# Patient Record
Sex: Male | Born: 1960 | Race: White | Hispanic: No | Marital: Married | Smoking: Never smoker
Health system: Southern US, Community
[De-identification: ages and names within clinical notes are randomized; demographics above are authoritative.]

## PROBLEM LIST (undated history)

## (undated) DIAGNOSIS — I509 Heart failure, unspecified: Secondary | ICD-10-CM

## (undated) DIAGNOSIS — K7460 Unspecified cirrhosis of liver: Secondary | ICD-10-CM

## (undated) DIAGNOSIS — J45909 Unspecified asthma, uncomplicated: Secondary | ICD-10-CM

## (undated) DIAGNOSIS — J449 Chronic obstructive pulmonary disease, unspecified: Secondary | ICD-10-CM

## (undated) DIAGNOSIS — G809 Cerebral palsy, unspecified: Secondary | ICD-10-CM

## (undated) HISTORY — PX: CHOLECYSTECTOMY: SHX55

## (undated) HISTORY — PX: LEG SURGERY: SHX1003

---

## 2017-12-14 ENCOUNTER — Emergency Department: Payer: Medicare Other

## 2017-12-14 ENCOUNTER — Other Ambulatory Visit: Payer: Self-pay

## 2017-12-14 ENCOUNTER — Inpatient Hospital Stay
Admission: EM | Admit: 2017-12-14 | Discharge: 2017-12-21 | DRG: 640 | Disposition: A | Discharge status: Skilled Nursing Facility | Payer: Medicare Other | Attending: Specialist | Admitting: Specialist

## 2017-12-14 DIAGNOSIS — G809 Cerebral palsy, unspecified: Secondary | ICD-10-CM | POA: Diagnosis present

## 2017-12-14 DIAGNOSIS — E871 Hypo-osmolality and hyponatremia: Secondary | ICD-10-CM | POA: Diagnosis present

## 2017-12-14 DIAGNOSIS — Z7189 Other specified counseling: Secondary | ICD-10-CM | POA: Diagnosis not present

## 2017-12-14 DIAGNOSIS — K269 Duodenal ulcer, unspecified as acute or chronic, without hemorrhage or perforation: Secondary | ICD-10-CM | POA: Diagnosis present

## 2017-12-14 DIAGNOSIS — K317 Polyp of stomach and duodenum: Secondary | ICD-10-CM | POA: Diagnosis present

## 2017-12-14 DIAGNOSIS — D62 Acute posthemorrhagic anemia: Secondary | ICD-10-CM | POA: Diagnosis not present

## 2017-12-14 DIAGNOSIS — K7581 Nonalcoholic steatohepatitis (NASH): Secondary | ICD-10-CM | POA: Diagnosis present

## 2017-12-14 DIAGNOSIS — Z6841 Body Mass Index (BMI) 40.0 and over, adult: Secondary | ICD-10-CM

## 2017-12-14 DIAGNOSIS — G4710 Hypersomnia, unspecified: Secondary | ICD-10-CM | POA: Diagnosis present

## 2017-12-14 DIAGNOSIS — R4182 Altered mental status, unspecified: Secondary | ICD-10-CM

## 2017-12-14 DIAGNOSIS — I959 Hypotension, unspecified: Secondary | ICD-10-CM | POA: Diagnosis present

## 2017-12-14 DIAGNOSIS — K7460 Unspecified cirrhosis of liver: Secondary | ICD-10-CM | POA: Diagnosis present

## 2017-12-14 DIAGNOSIS — Z66 Do not resuscitate: Secondary | ICD-10-CM | POA: Diagnosis present

## 2017-12-14 DIAGNOSIS — J449 Chronic obstructive pulmonary disease, unspecified: Secondary | ICD-10-CM | POA: Diagnosis present

## 2017-12-14 DIAGNOSIS — T502X5A Adverse effect of carbonic-anhydrase inhibitors, benzothiadiazides and other diuretics, initial encounter: Secondary | ICD-10-CM | POA: Diagnosis present

## 2017-12-14 DIAGNOSIS — D7589 Other specified diseases of blood and blood-forming organs: Secondary | ICD-10-CM | POA: Diagnosis present

## 2017-12-14 DIAGNOSIS — K766 Portal hypertension: Secondary | ICD-10-CM | POA: Diagnosis present

## 2017-12-14 DIAGNOSIS — E8881 Metabolic syndrome: Secondary | ICD-10-CM | POA: Diagnosis present

## 2017-12-14 DIAGNOSIS — S81812A Laceration without foreign body, left lower leg, initial encounter: Secondary | ICD-10-CM | POA: Diagnosis present

## 2017-12-14 DIAGNOSIS — W19XXXA Unspecified fall, initial encounter: Secondary | ICD-10-CM | POA: Diagnosis present

## 2017-12-14 DIAGNOSIS — K7290 Hepatic failure, unspecified without coma: Secondary | ICD-10-CM | POA: Diagnosis present

## 2017-12-14 DIAGNOSIS — K5900 Constipation, unspecified: Secondary | ICD-10-CM | POA: Diagnosis present

## 2017-12-14 DIAGNOSIS — Z79899 Other long term (current) drug therapy: Secondary | ICD-10-CM | POA: Diagnosis not present

## 2017-12-14 DIAGNOSIS — G9341 Metabolic encephalopathy: Secondary | ICD-10-CM | POA: Diagnosis present

## 2017-12-14 DIAGNOSIS — I5033 Acute on chronic diastolic (congestive) heart failure: Secondary | ICD-10-CM | POA: Diagnosis present

## 2017-12-14 DIAGNOSIS — Z515 Encounter for palliative care: Secondary | ICD-10-CM | POA: Diagnosis not present

## 2017-12-14 DIAGNOSIS — D649 Anemia, unspecified: Secondary | ICD-10-CM | POA: Diagnosis not present

## 2017-12-14 HISTORY — DX: Heart failure, unspecified: I50.9

## 2017-12-14 HISTORY — DX: Cerebral palsy, unspecified: G80.9

## 2017-12-14 HISTORY — DX: Unspecified asthma, uncomplicated: J45.909

## 2017-12-14 HISTORY — DX: Unspecified cirrhosis of liver: K74.60

## 2017-12-14 HISTORY — DX: Chronic obstructive pulmonary disease, unspecified: J44.9

## 2017-12-14 LAB — APTT: APTT: 45 s — AB (ref 24–36)

## 2017-12-14 LAB — COMPREHENSIVE METABOLIC PANEL
ALK PHOS: 130 U/L — AB (ref 38–126)
ALT: 47 U/L (ref 17–63)
AST: 84 U/L — ABNORMAL HIGH (ref 15–41)
Albumin: 2.2 g/dL — ABNORMAL LOW (ref 3.5–5.0)
Anion gap: 8 (ref 5–15)
BILIRUBIN TOTAL: 5.8 mg/dL — AB (ref 0.3–1.2)
BUN: 20 mg/dL (ref 6–20)
CALCIUM: 8 mg/dL — AB (ref 8.9–10.3)
CO2: 23 mmol/L (ref 22–32)
CREATININE: 1.11 mg/dL (ref 0.61–1.24)
Chloride: 92 mmol/L — ABNORMAL LOW (ref 101–111)
GFR calc non Af Amer: >60 mL/min (ref >60)
Glucose, Bld: 117 mg/dL — ABNORMAL HIGH (ref 65–99)
Potassium: 3.5 mmol/L (ref 3.5–5.1)
SODIUM: 123 mmol/L — AB (ref 135–145)
Total Protein: 5.6 g/dL — ABNORMAL LOW (ref 6.5–8.1)

## 2017-12-14 LAB — URIC ACID: Uric Acid, Serum: 8.7 mg/dL — ABNORMAL HIGH (ref 4.4–7.6)

## 2017-12-14 LAB — CBC
HEMATOCRIT: 23.1 % — AB (ref 40.0–52.0)
HEMOGLOBIN: 8.1 g/dL — AB (ref 13.0–18.0)
MCH: 36.1 pg — AB (ref 26.0–34.0)
MCHC: 35.1 g/dL (ref 32.0–36.0)
MCV: 102.9 fL — ABNORMAL HIGH (ref 80.0–100.0)
Platelets: 103 10*3/uL — ABNORMAL LOW (ref 150–440)
RBC: 2.25 MIL/uL — AB (ref 4.40–5.90)
RDW: 14.7 % — ABNORMAL HIGH (ref 11.5–14.5)
WBC: 5.8 10*3/uL (ref 3.8–10.6)

## 2017-12-14 LAB — PROTIME-INR
INR: 2.3
Prothrombin Time: 25.1 seconds — ABNORMAL HIGH (ref 11.4–15.2)

## 2017-12-14 LAB — AMMONIA: Ammonia: 35 umol/L (ref 9–35)

## 2017-12-14 LAB — LACTIC ACID, PLASMA
LACTIC ACID, VENOUS: 2.4 mmol/L — AB (ref 0.5–1.9)
Lactic Acid, Venous: 2.4 mmol/L (ref 0.5–1.9)

## 2017-12-14 LAB — TROPONIN I: Troponin I: <0.03 ng/mL (ref <0.03)

## 2017-12-14 LAB — MRSA PCR SCREENING: MRSA by PCR: NEGATIVE

## 2017-12-14 MED ORDER — LIDOCAINE-EPINEPHRINE (PF) 1 %-1:200000 IJ SOLN
30.0000 mL | Freq: Once | INTRAMUSCULAR | Status: AC
  Administered 2017-12-14: 30 mL via INTRADERMAL

## 2017-12-14 MED ORDER — SODIUM CHLORIDE 0.9 % IV SOLN
Freq: Once | INTRAVENOUS | Status: AC
  Administered 2017-12-14: 18:00:00 via INTRAVENOUS

## 2017-12-14 MED ORDER — ACETAMINOPHEN 650 MG RE SUPP: 650.0000 mg | Freq: Four times a day (QID) | RECTAL | Status: DC | PRN

## 2017-12-14 MED ORDER — MIDODRINE HCL 5 MG PO TABS
5.0000 mg | ORAL_TABLET | Freq: Once | ORAL | Status: AC
  Administered 2017-12-14: 5 mg via ORAL
  Filled 2017-12-14: qty 1

## 2017-12-14 MED ORDER — POLYETHYLENE GLYCOL 3350 17 G PO PACK
17.0000 g | PACK | Freq: Every day | ORAL | Status: DC
  Administered 2017-12-14 – 2017-12-20 (×7): 17 g via ORAL
  Filled 2017-12-14 (×7): qty 1

## 2017-12-14 MED ORDER — RIFAXIMIN 550 MG PO TABS
550.0000 mg | ORAL_TABLET | Freq: Two times a day (BID) | ORAL | Status: DC
  Administered 2017-12-14 – 2017-12-21 (×15): 550 mg via ORAL
  Filled 2017-12-14 (×15): qty 1

## 2017-12-14 MED ORDER — BISACODYL 5 MG PO TBEC
5.0000 mg | DELAYED_RELEASE_TABLET | Freq: Every day | ORAL | Status: DC | PRN
  Administered 2017-12-17: 5 mg via ORAL
  Filled 2017-12-14: qty 1

## 2017-12-14 MED ORDER — SODIUM CHLORIDE 0.9 % IV SOLN
1000.0000 mL | Freq: Once | INTRAVENOUS | Status: AC
  Administered 2017-12-14: 1000 mL via INTRAVENOUS

## 2017-12-14 MED ORDER — OXYCODONE HCL 5 MG PO TABS
5.0000 mg | ORAL_TABLET | Freq: Once | ORAL | Status: AC
  Administered 2017-12-14: 5 mg via ORAL
  Filled 2017-12-14: qty 1

## 2017-12-14 MED ORDER — LIDOCAINE-EPINEPHRINE (PF) 1 %-1:200000 IJ SOLN
INTRAMUSCULAR | Status: AC
  Administered 2017-12-14: 30 mL via INTRADERMAL
  Filled 2017-12-14: qty 30

## 2017-12-14 MED ORDER — ENOXAPARIN SODIUM 40 MG/0.4ML ~~LOC~~ SOLN
40.0000 mg | Freq: Two times a day (BID) | SUBCUTANEOUS | Status: DC
  Administered 2017-12-14 – 2017-12-21 (×14): 40 mg via SUBCUTANEOUS
  Filled 2017-12-14 (×14): qty 0.4

## 2017-12-14 MED ORDER — LACTULOSE 10 GM/15ML PO SOLN
30.0000 g | Freq: Three times a day (TID) | ORAL | Status: DC
  Administered 2017-12-14 – 2017-12-18 (×11): 30 g via ORAL
  Filled 2017-12-14 (×11): qty 60

## 2017-12-14 MED ORDER — PROMETHAZINE HCL 25 MG PO TABS
12.5000 mg | ORAL_TABLET | Freq: Four times a day (QID) | ORAL | Status: DC | PRN
  Filled 2017-12-14: qty 1

## 2017-12-14 MED ORDER — ACETAMINOPHEN 325 MG PO TABS: 650.0000 mg | ORAL_TABLET | Freq: Four times a day (QID) | ORAL | Status: DC | PRN

## 2017-12-14 MED ORDER — POLYETHYLENE GLYCOL 3350 17 G PO PACK
17.0000 g | PACK | Freq: Every day | ORAL | Status: DC | PRN
  Administered 2017-12-17: 11:00:00 17 g via ORAL
  Filled 2017-12-14 (×2): qty 1

## 2017-12-14 MED ORDER — POLYETHYLENE GLYCOL 3350 17 G PO PACK: 17.0000 g | PACK | Freq: Every day | ORAL | Status: DC

## 2017-12-14 MED ORDER — OXYCODONE HCL 5 MG PO TABS
5.0000 mg | ORAL_TABLET | ORAL | Status: DC | PRN
  Administered 2017-12-14 – 2017-12-21 (×7): 5 mg via ORAL
  Filled 2017-12-14 (×7): qty 1

## 2017-12-14 NOTE — Progress Notes (Signed)
Anticoagulation monitoring(Lovenox):  57 yo  male ordered Lovenox 40 mg Q24h  Filed Weights   12/14/17 0857 12/14/17 1507  Weight: (!) 347 lb (157.4 kg) (!) 338 lb 6.4 oz (153.5 kg)   BMI 54.65    Lab Results  Component Value Date   CREATININE 1.11 12/14/2017   Estimated Creatinine Clearance: 106 mL/min (by C-G formula based on SCr of 1.11 mg/dL). Hemoglobin & Hematocrit     Component Value Date/Time   HGB 8.1 (L) 12/14/2017 0922   HCT 23.1 (L) 12/14/2017 1610     Per Protocol for Patient with estCrcl > 30 ml/min and BMI >40, will transition to Lovenox 40 mg Q12h.

## 2017-12-14 NOTE — H&P (Signed)
[facility] [place] [facility]   PATIENT NAME: Antonio York    MR#:  [id number]  DATE OF BIRTH:  11/03/1960  DATE OF ADMISSION:  12/14/2017  PRIMARY CARE PHYSICIAN: Dimple Casey, MD   REQUESTING/REFERRING PHYSICIAN: dr Cyril Loosen  CHIEF COMPLAINT:  Confusion from [facility]  HISTORY OF PRESENT ILLNESS:  Antonio York  is a 57 y.o. 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  Patient reports that yesterday the MD at the facility told him the sodium level was low.  He was on fluid restriction and diuretics were discontinued.  This morning he reports that he fell.  He uses a Nurse, adult and has been ambulating with a walker.  Patient also noted to have a laceration on the left lower extremity which was sutured by ED physician.  Patient denies loss of consciousness, chest pain, nausea, vomiting, hematemesis, hematochezia or lightheadedness.  Sodium level is 123.  PAST MEDICAL HISTORY:   Past Medical History:  Diagnosis Date  . Asthma   . Cerebral palsy (HCC)   . CHF (congestive heart failure) (HCC)   . COPD (chronic obstructive pulmonary disease) (HCC)   . Liver cirrhosis (HCC)     PAST SURGICAL HISTORY:  None  SOCIAL HISTORY:  No tobacco, EtOH or IV drug use  FAMILY HISTORY:  No history of hemochromatosis or liver disease  DRUG ALLERGIES:  No Known Allergies  REVIEW OF SYSTEMS:   Review of Systems  Constitutional: Negative.  Negative for chills, fever and malaise/fatigue.  HENT: Negative.  Negative for ear discharge, ear pain, hearing loss, nosebleeds and sore throat.   Eyes: Negative.  Negative for blurred vision and pain.  Respiratory: Negative.  Negative for cough, hemoptysis, shortness of breath and wheezing.   Cardiovascular: Positive for leg swelling. Negative for chest pain and palpitations.   Gastrointestinal: Negative.  Negative for abdominal pain, blood in stool, diarrhea, nausea and vomiting.  Genitourinary: Negative.  Negative for dysuria.  Musculoskeletal: Negative.  Negative for back pain.  Skin: Negative.        Laceration noted on left shin  Neurological: Negative for dizziness, tremors, speech change, focal weakness, seizures and headaches.  Endo/Heme/Allergies: Negative.  Does not bruise/bleed easily.  Psychiatric/Behavioral: Negative.  Negative for depression, hallucinations and suicidal ideas.    MEDICATIONS AT HOME:   Prior to Admission medications   Medication Sig Start Date End Date Taking? Authorizing Provider  Diphenhyd-Hydrocort-Nystatin (FIRST-DUKES MOUTHWASH) SUSP Swish and swallow 5 mLs 3 (three) times daily before meals.   Yes [provider]  hydrocortisone cream 1 % Apply 1 application topically every 6 (six) hours as needed for itching. Apply to abdomen.   Yes [provider]  lactulose (CHRONULAC) 10 GM/15ML solution Take 45 mLs by mouth 3 (three) times daily. 11/17/17 12/17/17 Yes [provider]  polyethylene glycol (MIRALAX / GLYCOLAX) packet Take 17 g by mouth daily.   Yes [provider]  promethazine (PHENERGAN) 12.5 MG tablet Take 12.5 mg by mouth every 6 (six) hours as needed for nausea/vomiting.   Yes [provider]  spironolactone (ALDACTONE) 100 MG tablet Take 300 mg by mouth daily. 11/08/17  Yes [provider]  torsemide (DEMADEX) 20 MG tablet Take 100 mg by mouth daily. 11/08/17  Yes [provider]  XIFAXAN 550 MG TABS tablet Take 550 mg by mouth 2 (two) times daily.  10/16/17  Yes [provider]      VITAL SIGNS:  Blood pressure (!) 107/59, pulse 98, resp. rate (!) 21, weight (!) 157.4 kg (347 lb), SpO2 100 %.  PHYSICAL EXAMINATION:   Physical Exam  Constitutional: He is oriented to person, place, and time. No distress.  Morbidly obese  HENT:  Head:  Normocephalic.  Eyes: Scleral icterus is present.  Neck: Normal range of motion. Neck supple. No JVD present. No tracheal deviation present.  Cardiovascular: Normal rate, regular rhythm and normal heart sounds. Exam reveals no gallop and no friction rub.  No murmur heard. Pulmonary/Chest: Effort normal and breath sounds normal. No respiratory distress. He has no wheezes. He has no rales. He exhibits no tenderness.  Right IJ port  Abdominal: Soft. Bowel sounds are normal. He exhibits distension. He exhibits no mass. There is no tenderness. There is no rebound and no guarding.  Musculoskeletal: Normal range of motion. He exhibits edema.  Neurological: He is alert and oriented to person, place, and time.  Skin: Skin is warm. No rash noted. No erythema.  Laceration noted to left shin which was sutured Jaundice  Psychiatric: Judgment normal.      LABORATORY PANEL:   CBC Recent Labs  Lab 12/14/17 0922  WBC 5.8  HGB 8.1*  HCT 23.1*  PLT 103*   ------------------------------------------------------------------------------------------------------------------  Chemistries  Recent Labs  Lab 12/14/17 0922  NA 123*  K 3.5  CL 92*  CO2 23  GLUCOSE 117*  BUN 20  CREATININE 1.11  CALCIUM 8.0*  AST 84*  ALT 47  ALKPHOS 130*  BILITOT 5.8*   ------------------------------------------------------------------------------------------------------------------  Cardiac Enzymes Recent Labs  Lab 12/14/17 0922  TROPONINI <0.03   ------------------------------------------------------------------------------------------------------------------  RADIOLOGY:  Dg Tibia/fibula Left  Result Date: 12/14/2017 CLINICAL DATA:  Un witnessed fall. EXAM: LEFT TIBIA AND FIBULA - 2 VIEW COMPARISON:  None. FINDINGS: There is no evidence of fracture or dislocation. Soft tissues are unremarkable. IMPRESSION: No acute abnormality. Electronically Signed   By: Sherian Rein M.D.   On: 12/14/2017 10:03    Ct Head Wo Contrast  Result Date: 12/14/2017 CLINICAL DATA:  Post fall. EXAM: CT HEAD WITHOUT CONTRAST TECHNIQUE: Contiguous axial images were obtained from the base of the skull through the vertex without intravenous contrast. COMPARISON:  None. FINDINGS: Brain: Mild atrophy with centralized volume loss and commensurate ex vacuo dilatation of the ventricular system. Gray-white differentiation is maintained. No CT evidence of acute large territory infarct. No intraparenchymal or extra-axial mass or hemorrhage. Normal configuration of the ventricles and the basilar cisterns. No midline shift. Vascular: Minimal intracranial atherosclerosis. Skull: No definite displaced calvarial fracture. Sinuses/Orbits: Limited visualization of the paranasal sinuses and mastoid air cells is normal. No air-fluid levels. A small amount of debris is seen within bilateral external auditory canals. Other: Regional soft tissues appear normal. No radiopaque foreign body. IMPRESSION: Atrophy without superimposed acute intracranial process. Electronically Signed   By: Simonne Come M.D.   On: 12/14/2017 11:19   Dg Chest Portable 1 View  Result Date: 12/14/2017 CLINICAL DATA:  Un witnessed fall. EXAM: PORTABLE CHEST 1 VIEW COMPARISON:  None. FINDINGS: The heart size is mildly enlarged. Mediastinal contour is normal. Right central venous line is identified distal tip in superior vena cava. Both lungs are clear. The visualized skeletal structures are unremarkable. IMPRESSION: No active cardiopulmonary disease. Electronically Signed   By: Sherian Rein M.D.   On: 12/14/2017 10:02    EKG:  No ST elevation or depression  IMPRESSION  AND PLAN:   57 year old male with liver cirrhosis who presents to the ER after unwitnessed fall.  1.  Acute encephalopathy: Patient's mental status is at baseline  2.  Hyponatremia: This is likely due to diuretics possibly due to underlying liver cirrhosis Patient has received IV fluids in the  ER Sodium level every 6 hours Nephrology consultation for further evaluation and management Check sodium urine and uric acid  3.  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  Left shin laceration after fall: This was sutured by ER physician  5.  History of hemochromatosis: Patient has not required phlebotomy recently    All the records are reviewed and case discussed with ED provider. Management plans discussed with the patient and he is in agreement  CODE STATUS: FULL  TOTAL TIME TAKING CARE OF THIS PATIENT: 42 minutes.    Tekela Garguilo M.D on 12/14/2017 at 12:08 PM  Between 7am to 6pm - Pager - [phone]  After 6pm go to [email or web address] - password [email or web address]  [facility]  Office  [phone]  CC: Primary care physician; Dimple Casey, MD

## 2017-12-14 NOTE — ED Notes (Signed)
Date and time results received: 12/14/17 10:20 AM   Test: lactic acid Critical Value: 2.4  Name of Provider Notified: dr. Cyril Loosen  Orders Received? Or Actions Taken?: no new orders at this time

## 2017-12-14 NOTE — ED Provider Notes (Addendum)
[facility] Emergency Department Provider Note   ____________________________________________    I have reviewed the triage vital signs and the nursing notes.   HISTORY  Chief Complaint Fall   Patient unable to provide any history, unclear if this is his baseline or not  HPI Antonio York is a 57 y.o. male with a history of cerebral palsy, CHF, COPD as well as liver cirrhosis who presents after a fall with altered mental status.  Patient unable to provide any history.  EMS notes large laceration to the left leg   Past Medical History:  Diagnosis Date  . Asthma   . Cerebral palsy (HCC)   . CHF (congestive heart failure) (HCC)   . COPD (chronic obstructive pulmonary disease) (HCC)   . Liver cirrhosis [facility])     Patient Active Problem List   Diagnosis Date Noted  . Hyponatremia 12/14/2017    History reviewed. No pertinent surgical history.  Prior to Admission medications   Medication Sig Start Date End Date Taking? Authorizing Provider  Diphenhyd-Hydrocort-Nystatin (FIRST-DUKES MOUTHWASH) SUSP Swish and swallow 5 mLs 3 (three) times daily before meals.   Yes [provider]  hydrocortisone cream 1 % Apply 1 application topically every 6 (six) hours as needed for itching. Apply to abdomen.   Yes [provider]  lactulose (CHRONULAC) 10 GM/15ML solution Take 45 mLs by mouth 3 (three) times daily. 11/17/17 12/17/17 Yes [provider]  polyethylene glycol (MIRALAX / GLYCOLAX) packet Take 17 g by mouth daily.   Yes [provider]  promethazine (PHENERGAN) 12.5 MG tablet Take 12.5 mg by mouth every 6 (six) hours as needed for nausea/vomiting.   Yes [provider]  spironolactone (ALDACTONE) 100 MG tablet Take 300 mg by mouth daily. 11/08/17  Yes [provider]  torsemide (DEMADEX) 20 MG tablet Take 100 mg by mouth daily. 11/08/17  Yes [provider]  XIFAXAN 550 MG TABS tablet Take 550 mg by  mouth 2 (two) times daily. 10/16/17  Yes [provider]     Allergies Patient has no known allergies.  History reviewed. No pertinent family history.  Social History Social History   Tobacco Use  . Smoking status: Unknown If Ever Smoked  Substance Use Topics  . Alcohol use: Not Currently  . Drug use: Not Currently    Unable to obtain review of Systems due to altered mental status     ____________________________________________   PHYSICAL EXAM:  VITAL SIGNS: ED Triage Vitals  Enc Vitals Group     BP --      Pulse --      Resp --      Temp --      Temp src --      SpO2 --      Weight 12/14/17 0857 (!) 157.4 kg (347 lb)     Height --      Head Circumference --      Peak Flow --      Pain Score 12/14/17 0856 8     Pain Loc --      Pain Edu? --      Excl. in GC? --     Constitutional: Alert but confused, appears jaundiced  eyes: Mild icterus Head: Atraumatic. Nose: No swelling or epistaxis Mouth/Throat: Mucous membranes are dry Neck:  Painless ROM, no vertebral tenderness to palpation Cardiovascular: Normal rate, regular rhythm. Grossly normal heart sounds.  Good peripheral circulation. Respiratory: Normal respiratory effort.  No retractions.  Gastrointestinal: Soft and nontender. No distention.     Musculoskeletal: Warm and well perfused extremities.  Large curvilinear horizontal laceration to the left anterior lower leg approximately 15 to 20 cm Neurologic:   No gross focal neurologic deficits are appreciated.  Skin:  Skin is warm, dry and intact. No rash noted.   ____________________________________________   LABS (all labs ordered are listed, but only abnormal results are displayed)  Labs Reviewed  CBC - Abnormal; Notable for the following components:      Result Value   RBC 2.25 (*)    Hemoglobin 8.1 (*)    HCT 23.1 (*)    MCV 102.9 (*)    MCH 36.1 (*)    RDW 14.7 (*)    Platelets 103 (*)    All other components within normal  limits  COMPREHENSIVE METABOLIC PANEL - Abnormal; Notable for the following components:   Sodium 123 (*)    Chloride 92 (*)    Glucose, Bld 117 (*)    Calcium 8.0 (*)    Total Protein 5.6 (*)    Albumin 2.2 (*)    AST 84 (*)    Alkaline Phosphatase 130 (*)    Total Bilirubin 5.8 (*)    All other components within normal limits  LACTIC ACID, PLASMA - Abnormal; Notable for the following components:   Lactic Acid, Venous 2.4 (*)    All other components within normal limits  PROTIME-INR - Abnormal; Notable for the following components:   Prothrombin Time 25.1 (*)    All other components within normal limits  APTT - Abnormal; Notable for the following components:   aPTT 45 (*)    All other components within normal limits  CULTURE, BLOOD (ROUTINE X 2)  CULTURE, BLOOD (ROUTINE X 2)  URINE CULTURE  TROPONIN I  AMMONIA  LACTIC ACID, PLASMA  TYPE AND SCREEN   ____________________________________________  EKG  ED ECG REPORT I, Jene Every, the attending physician, personally viewed and interpreted this ECG.  Date: 12/14/2017  Rate: 77 Rhythm: normal sinus rhythm QRS Axis: normal Intervals: normal ST/T Wave abnormalities: normal Narrative Interpretation: no evidence of acute ischemia  ____________________________________________  RADIOLOGY  Chest x-ray unremarkable ____________________________________________   PROCEDURES  Procedure(s) performed: No  ..Laceration Repair Date/Time: 12/14/2017 12:23 PM Performed by: Jene Every, MD Authorized by: Jene Every, MD   Consent:    Consent obtained:  Verbal   Consent given by:  Patient   Risks discussed:  Infection, pain, poor cosmetic result and poor wound healing   Alternatives discussed:  No treatment Anesthesia (see MAR for exact dosages):    Anesthesia method:  Local infiltration   Local anesthetic:  Lidocaine 1% WITH epi Laceration details:    Location:  Leg   Leg location:  L lower leg   Length (cm):   20 Repair type:    Repair type:  Simple Pre-procedure details:    Preparation:  Patient was prepped and draped in usual sterile fashion Treatment:    Area cleansed with:  Betadine   Amount of cleaning:  Standard   Irrigation solution:  Sterile saline Skin repair:    Repair method:  Staples   Number of staples:  25 Approximation:    Approximation:  Close Post-procedure details:    Dressing:  Sterile dressing   Patient tolerance of procedure:  Tolerated well, no immediate complications     Critical Care performed: yes  CRITICAL CARE Performed by: Jene Every   Total critical care time:  30 minutes  Critical care time was exclusive of separately billable procedures and treating other patients.  Critical care was necessary to treat or prevent imminent or life-threatening deterioration.  Critical care was time spent personally by me on the following activities: development of treatment plan with patient and/or surrogate as well as nursing, discussions with consultants, evaluation of patient's response to treatment, examination of patient, obtaining history from patient or surrogate, ordering and performing treatments and interventions, ordering and review of laboratory studies, ordering and review of radiographic studies, pulse oximetry and re-evaluation of patient's condition.  ____________________________________________   INITIAL IMPRESSION / ASSESSMENT AND PLAN / ED COURSE  Pertinent labs & imaging results that were available during my care of the patient were reviewed by me and considered in my medical decision making (see chart for details).  Patient presents with altered mental status, after reported fall at nursing facility.  Large laceration to the left shin.  However I suspect his worsening mental status could be related to hepatic encephalopathy as he is hypotensive as well.  IV fluids started for initial low blood  pressure.  ----------------------------------------- 11:18 AM on 12/14/2017 ----------------------------------------- Good blood pressure response to IV fluids.  Mildly elevated lactic acid, however white blood cell count is normal.  Doubt sepsis, patient has significant hyponatremia.  Hemoglobin is baseline.  Patient's sodium is 123. Blood pressure has improved with fluids, no longer hypotensive. Wound repaired. Mental status has greatly improved, answering questions now     ____________________________________________   FINAL CLINICAL IMPRESSION(S) / ED DIAGNOSES  Final diagnoses:  Altered mental status, unspecified altered mental status type  Hyponatremia  Hypotension, unspecified hypotension type        Note:  This document was prepared using Dragon voice recognition software and may include unintentional dictation errors.    Jene Every, MD 12/14/17 1225    Jene Every, MD 12/23/17 [phone]

## 2017-12-14 NOTE — ED Notes (Signed)
Family at bedside. 

## 2017-12-14 NOTE — Progress Notes (Addendum)
1956  Paged Dr Judithann Sheen and discussed BP 89/3 with map 54.  Order given for midodrine 5 mg po once.  2210 BP 101/35 map 53.  Text paged with no answer. Henriette Combs RN   [phone] Discussed with Dr Caryn Bee and states because of the CHF, Liver cirrhosis and bilateral weeping legs, no IV  fluids should be given. Will continue to monitor patient. UA and AM labs drawn.Henriette Combs RN 0100 NA 122, sent result via text page to [facility] doc. Henriette Combs RN

## 2017-12-14 NOTE — ED Triage Notes (Signed)
Pt presents via EMS from [facility] s/p unwitnessed fall. Facility reports increased confusion per EMS report. LAC noted to LLE x2 per EMS. Currently dressed. Pt reports SOB. Tachypnea noted.

## 2017-12-14 NOTE — NC FL2 (Signed)
  [place] MEDICAID FL2 LEVEL OF CARE SCREENING TOOL     IDENTIFICATION  Patient Name: Antonio York Birthdate: [birth year] Sex: male Admission Date (Current Location): 12/14/2017  [place] and [place] Number:  Chiropodist and Address:  [facility], [address] [place], [place] [address]      Provider Number: [id number]  Attending Physician Name and Address:  Adrian Saran, MD  Relative Name and Phone Number:     Edmonia Caprio  Current Level of Care: SNF Recommended Level of Care: Skilled Nursing Facility Prior Approval Number:    Date Approved/Denied:   PASRR Number: [id number]  Discharge Plan: SNF(Or patient may return home)    Current Diagnoses: Patient Active Problem List   Diagnosis Date Noted  . Hyponatremia 12/14/2017    Orientation RESPIRATION BLADDER Height & Weight     Self, Time, Situation, Place  Normal Continent Weight: (!) 347 lb (157.4 kg) Height:     BEHAVIORAL SYMPTOMS/MOOD NEUROLOGICAL BOWEL NUTRITION STATUS      Continent Diet(Low sodium)  AMBULATORY STATUS COMMUNICATION OF NEEDS Skin   Supervision Verbally Bruising                       Personal Care Assistance Level of Assistance  Bathing, Feeding, Dressing, Total care Bathing Assistance: Limited assistance Feeding assistance: Independent Dressing Assistance: Limited assistance Total Care Assistance: Limited assistance   Functional Limitations Info  Sight, Hearing, Speech Sight Info: Adequate(does need glasses) Hearing Info: Adequate Speech Info: Adequate    SPECIAL CARE FACTORS FREQUENCY  PT (By licensed PT), OT (By licensed OT)   Patient has Cerebral Palsey     PT Frequency: x5 OT Frequency: x5            Contractures Contractures Info: Not present    Additional Factors Info                  Current Medications (12/14/2017):  This is the current hospital active medication list No current facility-administered medications for  this encounter.    Current Outpatient Medications  Medication Sig Dispense Refill  . Diphenhyd-Hydrocort-Nystatin (FIRST-DUKES MOUTHWASH) SUSP Swish and swallow 5 mLs 3 (three) times daily before meals.    . hydrocortisone cream 1 % Apply 1 application topically every 6 (six) hours as needed for itching. Apply to abdomen.    . lactulose (CHRONULAC) 10 GM/15ML solution Take 45 mLs by mouth 3 (three) times daily.    . polyethylene glycol (MIRALAX / GLYCOLAX) packet Take 17 g by mouth daily.    . promethazine (PHENERGAN) 12.5 MG tablet Take 12.5 mg by mouth every 6 (six) hours as needed for nausea/vomiting.    Marland Kitchen spironolactone (ALDACTONE) 100 MG tablet Take 300 mg by mouth daily.    Marland Kitchen torsemide (DEMADEX) 20 MG tablet Take 100 mg by mouth daily.    Marland Kitchen XIFAXAN 550 MG TABS tablet Take 550 mg by mouth 2 (two) times daily.  0     Discharge Medications: Please see discharge summary for a list of discharge medications.  Relevant Imaging Results:  Relevant Lab Results:   Additional Information SSN [id number]  Cheron Schaumann, [place]

## 2017-12-14 NOTE — Clinical Social Work Note (Signed)
Clinical Social Work Assessment  Patient Details  Name: Antonio York MRN: [id number] Date of Birth: Oct 10, 1960  Date of referral:  12/14/17               Reason for consult:  Other (Comment Required)(From [facility])                Permission sought to share information with:  Facility Medical sales representative, Family Supports Permission granted to share information::  Yes, Verbal Permission Granted  Name::     [name] wife [phone]  Agency::  [facility]  Relationship::     Contact Information:     Housing/Transportation Living arrangements for the past 2 months:  Skilled Building surveyor of Information:  Patient Patient Interpreter Needed:  None Criminal Activity/Legal Involvement Pertinent to Current Situation/Hospitalization:  No - Comment as needed Significant Relationships:  Spouse, Siblings Lives with:  Spouse Do you feel safe going back to the place where you live?  Yes Need for family participation in patient care:  Yes (Comment)  Care giving concerns: None mentioned   Social Worker assessment / plan: LCSW introduced myself to patient he is oriented x4. Patient presented to ED today and stated he had a fall and was lethargic and unresponsive for a while he has been at [facility] for 3 weeks having PT and plans to return either there or home. Patient is on a low sodium diet but prefers his wife cook and bring him food to facility or hospital. This worker encouraged him to clear all those requests with his nurse. Patient agreed. Patient gave this worker verbal consent to speak to wife and facility if necessary. He was pleasant and calm throughout his assessment. Patient has UHC/Medicare. Closer to discharge patient will decide if he returns to [facility] or home. He is faily independent with ADL's but needs some help with hygene application. He uses a walker ( 130) feet.  No further needs.  Employment status:  Disabled (Comment on whether or not currently receiving  Disability) Insurance information:  Engineer, petroleum) PT Recommendations:  Not assessed at this time Information / Referral to community resources:     Patient/Family's Response to care:  Patient understands his treatment plan  Patient/Family's Understanding of and Emotional Response to Diagnosis, Current Treatment, and Prognosis:  Patient has a good understanding of his treatment.  Emotional Assessment Appearance:  Appears stated age Attitude/Demeanor/Rapport:  Charismatic, Gracious Affect (typically observed):  Accepting, Adaptable, Pleasant Orientation:  Oriented to Self, Oriented to Situation, Oriented to Place, Oriented to  Time Alcohol / Substance use:  Not Applicable Psych involvement (Current and /or in the community):  No (Comment)  Discharge Needs  Concerns to be addressed:  No discharge needs identified Readmission within the last 30 days:  No Current discharge risk:  None Barriers to Discharge:  No Barriers Identified   Cheron Schaumann, LCSW 12/14/2017, 1:13 PM

## 2017-12-15 LAB — URINALYSIS, COMPLETE (UACMP) WITH MICROSCOPIC
Bacteria, UA: NONE SEEN
Bilirubin Urine: NEGATIVE
GLUCOSE, UA: NEGATIVE mg/dL
Hgb urine dipstick: NEGATIVE
KETONES UR: NEGATIVE mg/dL
Leukocytes, UA: NEGATIVE
NITRITE: NEGATIVE
PH: 6 (ref 5.0–8.0)
Protein, ur: NEGATIVE mg/dL
SPECIFIC GRAVITY, URINE: 1.008 (ref 1.005–1.030)

## 2017-12-15 LAB — SODIUM, URINE, RANDOM: Sodium, Ur: <10 mmol/L

## 2017-12-15 LAB — CBC
HEMATOCRIT: 21.9 % — AB (ref 40.0–52.0)
HEMOGLOBIN: 7.8 g/dL — AB (ref 13.0–18.0)
MCH: 36.9 pg — AB (ref 26.0–34.0)
MCHC: 35.6 g/dL (ref 32.0–36.0)
MCV: 103.5 fL — ABNORMAL HIGH (ref 80.0–100.0)
Platelets: 111 10*3/uL — ABNORMAL LOW (ref 150–440)
RBC: 2.12 MIL/uL — AB (ref 4.40–5.90)
RDW: 14.9 % — ABNORMAL HIGH (ref 11.5–14.5)
WBC: 8.2 10*3/uL (ref 3.8–10.6)

## 2017-12-15 LAB — LACTIC ACID, PLASMA
LACTIC ACID, VENOUS: 1.4 mmol/L (ref 0.5–1.9)
LACTIC ACID, VENOUS: 3.1 mmol/L — AB (ref 0.5–1.9)

## 2017-12-15 LAB — BASIC METABOLIC PANEL
ANION GAP: 4 — AB (ref 5–15)
BUN: 20 mg/dL (ref 6–20)
CO2: 24 mmol/L (ref 22–32)
Calcium: 7.9 mg/dL — ABNORMAL LOW (ref 8.9–10.3)
Chloride: 94 mmol/L — ABNORMAL LOW (ref 101–111)
Creatinine, Ser: 1.11 mg/dL (ref 0.61–1.24)
GFR calc non Af Amer: >60 mL/min (ref >60)
GLUCOSE: 141 mg/dL — AB (ref 65–99)
POTASSIUM: 3.5 mmol/L (ref 3.5–5.1)
Sodium: 122 mmol/L — ABNORMAL LOW (ref 135–145)

## 2017-12-15 LAB — IRON AND TIBC
IRON: 129 ug/dL (ref 45–182)
Saturation Ratios: 71 % — ABNORMAL HIGH (ref 17.9–39.5)
TIBC: 182 ug/dL — AB (ref 250–450)
UIBC: 53 ug/dL

## 2017-12-15 LAB — RETICULOCYTES
RBC.: 2.11 MIL/uL — AB (ref 4.40–5.90)
Retic Count, Absolute: 95 10*3/uL (ref 19.0–183.0)
Retic Ct Pct: 4.5 % — ABNORMAL HIGH (ref 0.4–3.1)

## 2017-12-15 LAB — FERRITIN: FERRITIN: 245 ng/mL (ref 24–336)

## 2017-12-15 LAB — TSH
TSH: 2.147 u[IU]/mL (ref 0.350–4.500)
TSH: 2.424 u[IU]/mL (ref 0.350–4.500)

## 2017-12-15 LAB — OSMOLALITY: Osmolality: 266 mOsm/kg — ABNORMAL LOW (ref 275–295)

## 2017-12-15 LAB — FOLATE: Folate: 6.9 ng/mL (ref >5.9)

## 2017-12-15 LAB — OSMOLALITY, URINE: OSMOLALITY UR: 285 mosm/kg — AB (ref 300–900)

## 2017-12-15 LAB — CREATININE, URINE, RANDOM: Creatinine, Urine: 117 mg/dL

## 2017-12-15 LAB — URIC ACID: URIC ACID, SERUM: 8.7 mg/dL — AB (ref 4.4–7.6)

## 2017-12-15 MED ORDER — ALBUMIN HUMAN 25 % IV SOLN
25.0000 g | Freq: Three times a day (TID) | INTRAVENOUS | Status: DC
  Administered 2017-12-15 – 2017-12-17 (×6): 25 g via INTRAVENOUS
  Filled 2017-12-15 (×10): qty 100

## 2017-12-15 MED ORDER — SODIUM CHLORIDE 1 G PO TABS
2.0000 g | ORAL_TABLET | Freq: Three times a day (TID) | ORAL | Status: DC
  Administered 2017-12-15 – 2017-12-16 (×4): 2 g via ORAL
  Filled 2017-12-15 (×5): qty 2

## 2017-12-15 MED ORDER — MIDODRINE HCL 5 MG PO TABS
5.0000 mg | ORAL_TABLET | Freq: Three times a day (TID) | ORAL | Status: DC
  Administered 2017-12-15 – 2017-12-17 (×5): 5 mg via ORAL
  Filled 2017-12-15 (×6): qty 1

## 2017-12-15 MED ORDER — SODIUM CHLORIDE 0.9 % IV SOLN
INTRAVENOUS | Status: DC
  Administered 2017-12-15: 14:00:00 via INTRAVENOUS

## 2017-12-15 MED ORDER — SODIUM CHLORIDE 0.9 % IV SOLN
INTRAVENOUS | Status: DC
  Administered 2017-12-15 – 2017-12-16 (×2): via INTRAVENOUS

## 2017-12-15 NOTE — Progress Notes (Signed)
Chaplain responded to a request for AD education and met with the patient and his twin brothers. Patient was emotional and expressed concerns about his health, future, and care of his disabled wife. Chaplain provided education, stopping to discuss terms, definitions, and patient rights. Punctuating these concepts seemed especially important concerning the patient's attention span and the tension between the brothers. Chaplain offered to return for notarization if requested.

## 2017-12-15 NOTE — Plan of Care (Signed)
  Problem: Spiritual Needs Goal: Ability to function at adequate level Outcome: Progressing   Problem: Education: Goal: Knowledge of General Education information will improve Outcome: Progressing   Problem: Health Behavior/Discharge Planning: Goal: Ability to manage health-related needs will improve Outcome: Progressing   Problem: Clinical Measurements: Goal: Ability to maintain clinical measurements within normal limits will improve Outcome: Progressing Goal: Diagnostic test results will improve Outcome: Progressing Goal: Respiratory complications will improve Outcome: Progressing   Problem: Activity: Goal: Risk for activity intolerance will decrease Outcome: Progressing   Problem: Elimination: Goal: Will not experience complications related to bowel motility Outcome: Progressing Goal: Will not experience complications related to urinary retention Outcome: Progressing   Problem: Safety: Goal: Ability to remain free from injury will improve Outcome: Progressing   Problem: Skin Integrity: Goal: Risk for impaired skin integrity will decrease Outcome: Progressing

## 2017-12-15 NOTE — Consult Note (Signed)
[facility] CONSULT NOTE    Date: 12/15/2017                  Patient Name:  Antonio York  MRN: [id number]  DOB: 1961-02-20  Age / Sex: 57 y.o., male         PCP: Dimple Casey, MD                 Service Requesting Consult: Hospitalist                 Reason for Consult: Hyponatremia            History of Present Illness: Patient is a 57 y.o. 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  The patient was recently admitted to [facility] health care for ongoing rehabilitation.  Prior to this he was recently admitted to [facility].  He experienced a fall prior to admission.  He sustained a laceration on his left lower extremity which was subsequently sutured by emergency room physician.  Serum sodium was 123 upon admission and currently down to 122.  Patient is also relatively hypotensive.  Was on torsemide as well as spironolactone prior to admission.  He was on very high-dose spinal lactone 300 mg daily as well as torsemide 100 mg p.o. daily.   Medications: Outpatient medications: Medications Prior to Admission  Medication Sig Dispense Refill Last Dose  . Diphenhyd-Hydrocort-Nystatin (FIRST-DUKES MOUTHWASH) SUSP Swish and swallow 5 mLs 3 (three) times daily before meals.   12/13/2017 at Unknown time  . hydrocortisone cream 1 % Apply 1 application topically every 6 (six) hours as needed for itching. Apply to abdomen.   PRN at PRN  . lactulose (CHRONULAC) 10 GM/15ML solution Take 45 mLs by mouth 3 (three) times daily.   12/13/2017 at 2000  . polyethylene glycol (MIRALAX / GLYCOLAX) packet Take 17 g by mouth daily.   12/13/2017 at Unknown time  . promethazine (PHENERGAN) 12.5 MG tablet Take 12.5 mg by mouth every 6 (six) hours as needed for  nausea/vomiting.   PRN at PRN  . spironolactone (ALDACTONE) 100 MG tablet Take 300 mg by mouth daily.   12/13/2017 at Unknown time  . torsemide (DEMADEX) 20 MG tablet Take 100 mg by mouth daily.   12/13/2017 at Unknown time  . XIFAXAN 550 MG TABS tablet Take 550 mg by mouth 2 (two) times daily.  0 12/13/2017 at 2000    Current medications: Current Facility-Administered Medications  Medication Dose Route Frequency Provider Last Rate Last Dose  . 0.9 %  sodium chloride infusion   Intravenous Continuous Salary, Montell D, MD 75 mL/hr at 12/15/17 1402    . acetaminophen (TYLENOL) tablet 650 mg  650 mg Oral Q6H PRN Adrian Saran, MD       Or  . acetaminophen (TYLENOL) suppository 650 mg  650 mg Rectal Q6H PRN Mody, Sital, MD      . bisacodyl (DULCOLAX) EC tablet 5 mg  5 mg Oral Daily PRN Mody, Sital, MD      . enoxaparin (LOVENOX) injection 40 mg  40 mg Subcutaneous Q12H Mody, Sital, MD   40 mg at 12/15/17 0853  . lactulose (CHRONULAC) 10 GM/15ML solution 30 g  30 g Oral TID Adrian Saran, MD   30 g at 12/15/17 0853  . oxyCODONE (Oxy IR/ROXICODONE)  immediate release tablet 5 mg  5 mg Oral Q4H PRN Adrian Saran, MD   5 mg at 12/15/17 1201  . polyethylene glycol (MIRALAX / GLYCOLAX) packet 17 g  17 g Oral Daily PRN Mody, Sital, MD      . polyethylene glycol (MIRALAX / GLYCOLAX) packet 17 g  17 g Oral QHS Adrian Saran, MD   17 g at 12/14/17 2035  . promethazine (PHENERGAN) tablet 12.5 mg  12.5 mg Oral Q6H PRN Adrian Saran, MD      . rifaximin (XIFAXAN) tablet 550 mg  550 mg Oral BID Adrian Saran, MD   550 mg at 12/15/17 0852  . sodium chloride tablet 2 g  2 g Oral TID WC Salary, Montell D, MD   2 g at 12/15/17 1147      Allergies: No Known Allergies    Past Medical History: Past Medical History:  Diagnosis Date  . Asthma   . Cerebral palsy (HCC)   . CHF (congestive heart failure) (HCC)   . COPD (chronic obstructive pulmonary disease) (HCC)   . Liver cirrhosis (HCC)      Past Surgical  History: History reviewed. No pertinent surgical history.   Family History: History reviewed. No pertinent family history.   Social History: Social History   Socioeconomic History  . Marital status: Married    Spouse name: Not on file  . Number of children: Not on file  . Years of education: Not on file  . Highest education level: Not on file  Occupational History  . Not on file  Social Needs  . Financial resource strain: Not hard at all  . Food insecurity:    Worry: Never true    Inability: Never true  . Transportation needs:    Medical: No    Non-medical: No  Tobacco Use  . Smoking status: Never Smoker  . Smokeless tobacco: Never Used  Substance and Sexual Activity  . Alcohol use: Not Currently  . Drug use: Not Currently  . Sexual activity: Not Currently  Lifestyle  . Physical activity:    Days per week: 0 days    Minutes per session: 0 min  . Stress: Not at all  Relationships  . Social connections:    Talks on phone: More than three times a week    Gets together: Once a week    Attends religious service: Never    Active member of club or organization: No    Attends meetings of clubs or organizations: Never    Relationship status: Married  . Intimate partner violence:    Fear of current or ex partner: No    Emotionally abused: No    Physically abused: No    Forced sexual activity: No  Other Topics Concern  . Not on file  Social History Narrative   Lives at [facility], married.     Review of Systems: Review of Systems  Constitutional: Positive for malaise/fatigue. Negative for chills and fever.  HENT: Negative for congestion, hearing loss and nosebleeds.   Eyes: Negative for blurred vision and double vision.  Respiratory: Negative for cough, hemoptysis and sputum production.   Cardiovascular: Negative for chest pain, palpitations and orthopnea.  Gastrointestinal: Negative for heartburn, nausea and vomiting.  Genitourinary: Negative  for dysuria, frequency and urgency.  Musculoskeletal: Positive for falls. Negative for myalgias.  Skin: Negative for itching and rash.  Neurological: Positive for weakness.  Endo/Heme/Allergies: Does not bruise/bleed easily.  Psychiatric/Behavioral: Negative for depression and hallucinations.  Vital Signs: Blood pressure (!) 90/45, pulse 96, temperature 98 F (36.7 C), temperature source Oral, resp. rate 18, height  (1.676 m), weight (!) 162.5 kg (358 lb 3.2 oz), SpO2 95 %.  Weight trends: Filed Weights   12/14/17 0857 12/14/17 1507 12/15/17 0504  Weight: (!) 157.4 kg (347 lb) (!) 153.5 kg (338 lb 6.4 oz) (!) 162.5 kg (358 lb 3.2 oz)    Physical Exam: General: NAD,   Head: Normocephalic, atraumatic.  Eyes: Anicteric, EOMI  Nose: Mucous membranes moist, not inflammed, nonerythematous.  Throat: Oropharynx nonerythematous, no exudate appreciated.   Neck: Supple, trachea midline.  Lungs:  Normal respiratory effort. Clear to auscultation BL without crackles or wheezes.  Heart: RRR. S1 and S2 normal without gallop, murmur, or rubs.  Abdomen:  BS normoactive. Soft, Nondistended, non-tender.  No masses or organomegaly.  Extremities: No pretibial edema.  Neurologic: A&O X3, Motor strength is 5/5 in the all 4 extremities  Skin: No visible rashes, scars.    Lab results: Basic Metabolic Panel: Recent Labs  Lab 12/14/17 0922 12/15/17 0021  NA 123* 122*  K 3.5 3.5  CL 92* 94*  CO2 23 24  GLUCOSE 117* 141*  BUN 20 20  CREATININE 1.11 1.11  CALCIUM 8.0* 7.9*    Liver Function Tests: Recent Labs  Lab 12/14/17 0922  AST 84*  ALT 47  ALKPHOS 130*  BILITOT 5.8*  PROT 5.6*  ALBUMIN 2.2*   No results for input(s): LIPASE, AMYLASE in the last 168 hours. Recent Labs  Lab 12/14/17 0923  AMMONIA 35    CBC: Recent Labs  Lab 12/14/17 0922 12/15/17 0021  WBC 5.8 8.2  HGB 8.1* 7.8*  HCT 23.1* 21.9*  MCV 102.9* 103.5*  PLT 103* 111*    Cardiac Enzymes: Recent  Labs  Lab 12/14/17 0922  TROPONINI <0.03    BNP: Invalid input(s): POCBNP  CBG: No results for input(s): GLUCAP in the last 168 hours.  Microbiology: Results for orders placed or performed during the hospital encounter of 12/14/17  Blood culture (routine x 2)     Status: None (Preliminary result)   Collection Time: 12/14/17  9:22 AM  Result Value Ref Range Status   Specimen Description BLOOD R PORT  Final   Special Requests   Final    BOTTLES DRAWN AEROBIC AND ANAEROBIC Blood Culture results may not be optimal due to an excessive volume of blood received in culture bottles   Culture   Final    NO GROWTH < 24 HOURS Performed at [facility], [address] [place], [place] [address]    Report Status PENDING  Incomplete  Blood culture (routine x 2)     Status: None (Preliminary result)   Collection Time: 12/14/17 12:58 PM  Result Value Ref Range Status   Specimen Description BLOOD RT [address] [facility]  Final   Special Requests   Final    BOTTLES DRAWN AEROBIC AND ANAEROBIC Blood Culture adequate volume   Culture   Final    NO GROWTH < 24 HOURS Performed at [facility], [address] [place], [place] [address]    Report Status PENDING  Incomplete  MRSA PCR Screening     Status: None   Collection Time: 12/14/17  3:42 PM  Result Value Ref Range Status   MRSA by PCR NEGATIVE NEGATIVE Final    Comment:        The GeneXpert MRSA Assay (FDA approved for NASAL specimens only), is one component of a  comprehensive MRSA colonization surveillance program. It is not intended to diagnose MRSA infection nor to guide or monitor treatment for MRSA infections. Performed at [facility], [address] [place], [place] [address]     Coagulation Studies: Recent Labs    12/14/17 6045  LABPROT 25.1*  INR 2.30    Urinalysis: Recent Labs    12/15/17 1143  COLORURINE AMBER*  LABSPEC 1.008  PHURINE 6.0  GLUCOSEU NEGATIVE  HGBUR NEGATIVE  BILIRUBINUR  NEGATIVE  KETONESUR NEGATIVE  PROTEINUR NEGATIVE  NITRITE NEGATIVE  LEUKOCYTESUR NEGATIVE      Imaging: Dg Tibia/fibula Left  Result Date: 12/14/2017 CLINICAL DATA:  Un witnessed fall. EXAM: LEFT TIBIA AND FIBULA - 2 VIEW COMPARISON:  None. FINDINGS: There is no evidence of fracture or dislocation. Soft tissues are unremarkable. IMPRESSION: No acute abnormality. Electronically Signed   By: Sherian Rein M.D.   On: 12/14/2017 10:03   Ct Head Wo Contrast  Result Date: 12/14/2017 CLINICAL DATA:  Post fall. EXAM: CT HEAD WITHOUT CONTRAST TECHNIQUE: Contiguous axial images were obtained from the base of the skull through the vertex without intravenous contrast. COMPARISON:  None. FINDINGS: Brain: Mild atrophy with centralized volume loss and commensurate ex vacuo dilatation of the ventricular system. Gray-white differentiation is maintained. No CT evidence of acute large territory infarct. No intraparenchymal or extra-axial mass or hemorrhage. Normal configuration of the ventricles and the basilar cisterns. No midline shift. Vascular: Minimal intracranial atherosclerosis. Skull: No definite displaced calvarial fracture. Sinuses/Orbits: Limited visualization of the paranasal sinuses and mastoid air cells is normal. No air-fluid levels. A small amount of debris is seen within bilateral external auditory canals. Other: Regional soft tissues appear normal. No radiopaque foreign body. IMPRESSION: Atrophy without superimposed acute intracranial process. Electronically Signed   By: Simonne Come M.D.   On: 12/14/2017 11:19   Dg Chest Portable 1 View  Result Date: 12/14/2017 CLINICAL DATA:  Un witnessed fall. EXAM: PORTABLE CHEST 1 VIEW COMPARISON:  None. FINDINGS: The heart size is mildly enlarged. Mediastinal contour is normal. Right central venous line is identified distal tip in superior vena cava. Both lungs are clear. The visualized skeletal structures are unremarkable. IMPRESSION: No active  cardiopulmonary disease. Electronically Signed   By: Sherian Rein M.D.   On: 12/14/2017 10:02      Assessment & Plan: Pt is a 57 y.o. 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   1.  Hyponatremia. 2.  Hypotension. 3.  Liver cirrhosis secondary to Nash/hemochromatosis. 4.  Anemia unspecified.  Plan: We are asked to see the patient for hyponatremia.  He was on very high-dose torsemide as well as spinal lactone as an outpatient.  It appears that he has some baseline hyponatremia as noted at [facility].  He has been taken off of diuretics.  At this point we elected to administer 0.9 normal saline at a slow rate of 50 cc/h as well as albumin 25 g IV every 8 hours.  We will need to monitor for worsening signs of edema.  Patient also has relative hypotension and we will start the patient on midodrine for this issue.  Overall patient has a guarded prognosis.

## 2017-12-15 NOTE — Progress Notes (Signed)
PT Cancellation Note  Patient Details Name: Antonio York MRN: [id number] DOB: 03-08-1961   Cancelled Treatment:    Reason Eval/Treat Not Completed: Medical issues which prohibited therapy.  Pt has critical labs and BP is quite low at 90/45.  Will try later as time and pt allow.    Ivar Drape 12/15/2017, 2:17 PM   Samul Dada, PT MS Acute Rehab Dept. Number: [facility] [id number] and [facility] [phone]

## 2017-12-15 NOTE — Progress Notes (Signed)
CRITICAL VALUE ALERT  Critical Value:  Lactic Acid 3.1   Date & Time Notied:  12/15/17 1248  Provider Notified: Salary MD  Orders Received/Actions taken: Dr Katheren Shams to enter orders in [facility]

## 2017-12-15 NOTE — Progress Notes (Signed)
[employer] - [place] at [facility]   PATIENT NAME: Antonio York    MR#:  [id number]  DATE OF BIRTH:  1961/06/01  SUBJECTIVE:  CHIEF COMPLAINT:   Chief Complaint  Patient presents with  . Fall  Patient without complaint, brothers are at the bedside  REVIEW OF SYSTEMS:  CONSTITUTIONAL: No fever, fatigue or weakness.  EYES: No blurred or double vision.  EARS, NOSE, AND THROAT: No tinnitus or ear pain.  RESPIRATORY: No cough, shortness of breath, wheezing or hemoptysis.  CARDIOVASCULAR: No chest pain, orthopnea, edema.  GASTROINTESTINAL: No nausea, vomiting, diarrhea or abdominal pain.  GENITOURINARY: No dysuria, hematuria.  ENDOCRINE: No polyuria, nocturia,  HEMATOLOGY: No anemia, easy bruising or bleeding SKIN: No rash or lesion. MUSCULOSKELETAL: No joint pain or arthritis.   NEUROLOGIC: No tingling, numbness, weakness.  PSYCHIATRY: No anxiety or depression.   ROS  DRUG ALLERGIES:  No Known Allergies  VITALS:  Blood pressure (!) 90/45, pulse 96, temperature 98 F (36.7 C), temperature source Oral, resp. rate 18, height  (1.676 m), weight (!) 162.5 kg (358 lb 3.2 oz), SpO2 95 %.  PHYSICAL EXAMINATION:  GENERAL:  57 y.o.-year-old patient lying in the bed with no acute distress.  EYES: Pupils equal, round, reactive to light and accommodation. No scleral icterus. Extraocular muscles intact.  HEENT: Head atraumatic, normocephalic. Oropharynx and nasopharynx clear.  NECK:  Supple, no jugular venous distention. No thyroid enlargement, no tenderness.  LUNGS: Normal breath sounds bilaterally, no wheezing, rales,rhonchi or crepitation. No use of accessory muscles of respiration.  CARDIOVASCULAR: S1, S2 normal. No murmurs, rubs, or gallops.  ABDOMEN: Soft, nontender, nondistended. Bowel sounds present. No organomegaly or mass.  EXTREMITIES: No pedal edema, cyanosis, or clubbing.  NEUROLOGIC: Cranial nerves II through XII are intact. Muscle strength 5/5 in all  extremities. Sensation intact. Gait not checked.  PSYCHIATRIC: The patient is alert and oriented x 3.  SKIN: No obvious rash, lesion, or ulcer.   Physical Exam LABORATORY PANEL:   CBC Recent Labs  Lab 12/15/17 0021  WBC 8.2  HGB 7.8*  HCT 21.9*  PLT 111*   ------------------------------------------------------------------------------------------------------------------  Chemistries  Recent Labs  Lab 12/14/17 0922 12/15/17 0021  NA 123* 122*  K 3.5 3.5  CL 92* 94*  CO2 23 24  GLUCOSE 117* 141*  BUN 20 20  CREATININE 1.11 1.11  CALCIUM 8.0* 7.9*  AST 84*  --   ALT 47  --   ALKPHOS 130*  --   BILITOT 5.8*  --    ------------------------------------------------------------------------------------------------------------------  Cardiac Enzymes Recent Labs  Lab 12/14/17 0922  TROPONINI <0.03   ------------------------------------------------------------------------------------------------------------------  RADIOLOGY:  Dg Tibia/fibula Left  Result Date: 12/14/2017 CLINICAL DATA:  Un witnessed fall. EXAM: LEFT TIBIA AND FIBULA - 2 VIEW COMPARISON:  None. FINDINGS: There is no evidence of fracture or dislocation. Soft tissues are unremarkable. IMPRESSION: No acute abnormality. Electronically Signed   By: Sherian Rein M.D.   On: 12/14/2017 10:03   Ct Head Wo Contrast  Result Date: 12/14/2017 CLINICAL DATA:  Post fall. EXAM: CT HEAD WITHOUT CONTRAST TECHNIQUE: Contiguous axial images were obtained from the base of the skull through the vertex without intravenous contrast. COMPARISON:  None. FINDINGS: Brain: Mild atrophy with centralized volume loss and commensurate ex vacuo dilatation of the ventricular system. Gray-white differentiation is maintained. No CT evidence of acute large territory infarct. No intraparenchymal or extra-axial mass or hemorrhage. Normal configuration of the ventricles and the basilar cisterns. No midline shift. Vascular: Minimal intracranial  atherosclerosis. Skull: No definite displaced calvarial fracture. Sinuses/Orbits: Limited visualization of the paranasal sinuses and mastoid air cells is normal. No air-fluid levels. A small amount of debris is seen within bilateral external auditory canals. Other: Regional soft tissues appear normal. No radiopaque foreign body. IMPRESSION: Atrophy without superimposed acute intracranial process. Electronically Signed   By: Simonne Come M.D.   On: 12/14/2017 11:19   Dg Chest Portable 1 View  Result Date: 12/14/2017 CLINICAL DATA:  Un witnessed fall. EXAM: PORTABLE CHEST 1 VIEW COMPARISON:  None. FINDINGS: The heart size is mildly enlarged. Mediastinal contour is normal. Right central venous line is identified distal tip in superior vena cava. Both lungs are clear. The visualized skeletal structures are unremarkable. IMPRESSION: No active cardiopulmonary disease. Electronically Signed   By: Sherian Rein M.D.   On: 12/14/2017 10:02    ASSESSMENT AND PLAN:  57 year old male with liver cirrhosis who presents to the ER after unwitnessed fall.  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 Management plans discussed with the patient, family and they are in agreement.  CODE STATUS: full  TOTAL TIME TAKING CARE OF THIS PATIENT: 35 minutes.    POSSIBLE D/C  IN 1-3 DAYS, DEPENDING ON CLINICAL CONDITION.   Evelena Asa Donie Lemelin M.D on 12/15/2017   Between 7am to 6pm - Pager - [phone]  After 6pm go to [email or web address] - password [email or web address]  [facility]  Office  [phone]  CC: Primary care physician; Dimple Casey, MD  Note: This dictation was prepared with Dragon dictation along with smaller phrase technology. Any transcriptional errors that result from this process are unintentional.

## 2017-12-16 LAB — BASIC METABOLIC PANEL
Anion gap: 6 (ref 5–15)
BUN: 23 mg/dL — AB (ref 6–20)
CALCIUM: 8.5 mg/dL — AB (ref 8.9–10.3)
CO2: 23 mmol/L (ref 22–32)
Chloride: 94 mmol/L — ABNORMAL LOW (ref 101–111)
Creatinine, Ser: 1.39 mg/dL — ABNORMAL HIGH (ref 0.61–1.24)
GFR calc Af Amer: >60 mL/min (ref >60)
GFR, EST NON AFRICAN AMERICAN: 56 mL/min — AB (ref >60)
GLUCOSE: 117 mg/dL — AB (ref 65–99)
Potassium: 3.8 mmol/L (ref 3.5–5.1)
SODIUM: 123 mmol/L — AB (ref 135–145)

## 2017-12-16 LAB — CORTISOL: CORTISOL PLASMA: 7.6 ug/dL

## 2017-12-16 LAB — URINE CULTURE

## 2017-12-16 LAB — BLOOD GAS, ARTERIAL
ACID-BASE DEFICIT: 2.2 mmol/L — AB (ref 0.0–2.0)
BICARBONATE: 22.2 mmol/L (ref 20.0–28.0)
FIO2: 0.21
O2 SAT: 92.9 %
PCO2 ART: 35 mmHg (ref 32.0–48.0)
PO2 ART: 66 mmHg — AB (ref 83.0–108.0)
Patient temperature: 37
pH, Arterial: 7.41 (ref 7.350–7.450)

## 2017-12-16 LAB — AMMONIA: Ammonia: 71 umol/L — ABNORMAL HIGH (ref 9–35)

## 2017-12-16 LAB — HIV ANTIBODY (ROUTINE TESTING W REFLEX): HIV Screen 4th Generation wRfx: NONREACTIVE

## 2017-12-16 LAB — SODIUM
SODIUM: 122 mmol/L — AB (ref 135–145)
Sodium: 124 mmol/L — ABNORMAL LOW (ref 135–145)

## 2017-12-16 LAB — VITAMIN B12: VITAMIN B 12: 1166 pg/mL — AB (ref 180–914)

## 2017-12-16 MED ORDER — PROMETHAZINE HCL 12.5 MG PO TABS
6.2500 mg | ORAL_TABLET | Freq: Four times a day (QID) | ORAL | Status: DC | PRN
  Filled 2017-12-16: qty 1

## 2017-12-16 MED ORDER — TORSEMIDE 20 MG PO TABS
40.0000 mg | ORAL_TABLET | Freq: Every day | ORAL | Status: DC
  Administered 2017-12-16 – 2017-12-18 (×3): 40 mg via ORAL
  Filled 2017-12-16 (×4): qty 2

## 2017-12-16 MED ORDER — SENNOSIDES-DOCUSATE SODIUM 8.6-50 MG PO TABS
2.0000 | ORAL_TABLET | Freq: Every day | ORAL | Status: DC
  Administered 2017-12-16 – 2017-12-17 (×2): 2 via ORAL
  Filled 2017-12-16 (×2): qty 2

## 2017-12-16 MED ORDER — PROMETHAZINE HCL 6.25 MG/5ML PO SYRP
6.2500 mg | ORAL_SOLUTION | Freq: Four times a day (QID) | ORAL | Status: DC | PRN
  Administered 2017-12-17 – 2017-12-18 (×3): 6.25 mg via ORAL
  Filled 2017-12-16 (×4): qty 5

## 2017-12-16 MED ORDER — SPIRONOLACTONE 25 MG PO TABS
25.0000 mg | ORAL_TABLET | Freq: Every day | ORAL | Status: DC
  Administered 2017-12-16 – 2017-12-21 (×6): 25 mg via ORAL
  Filled 2017-12-16 (×7): qty 1

## 2017-12-16 NOTE — Clinical Social Work Note (Signed)
CSW spoke with patient regarding discharge plan. Patient was recently admitted to [facility] for rehab but he wants to go home at discharge. Patient states that he lives with his wife who is disabled and has 2 brothers that come by to help him everyday. Patient would like to have home health. CSW notified RN CM of above. CSW signing off. Please consult if further needs arise.   Ruthe Mannan MSW, [address]  [phone]

## 2017-12-16 NOTE — Progress Notes (Signed)
[employer] - [place] at [facility]   PATIENT NAME: Antonio York    MR#:  [id number]  DATE OF BIRTH:  1961/04/16  SUBJECTIVE:  CHIEF COMPLAINT:   Chief Complaint  Patient presents with  . Fall  Patient with intermittent lethargy/hypersomnia, nephrology input appreciated  REVIEW OF SYSTEMS:  CONSTITUTIONAL: No fever, fatigue or weakness.  EYES: No blurred or double vision.  EARS, NOSE, AND THROAT: No tinnitus or ear pain.  RESPIRATORY: No cough, shortness of breath, wheezing or hemoptysis.  CARDIOVASCULAR: No chest pain, orthopnea, edema.  GASTROINTESTINAL: No nausea, vomiting, diarrhea or abdominal pain.  GENITOURINARY: No dysuria, hematuria.  ENDOCRINE: No polyuria, nocturia,  HEMATOLOGY: No anemia, easy bruising or bleeding SKIN: No rash or lesion. MUSCULOSKELETAL: No joint pain or arthritis.   NEUROLOGIC: No tingling, numbness, weakness.  PSYCHIATRY: No anxiety or depression.   ROS  DRUG ALLERGIES:  No Known Allergies  VITALS:  Blood pressure (!) 107/47, pulse 91, temperature 97.7 F (36.5 C), temperature source Oral, resp. rate 18, height  (1.676 m), weight (!) 160.2 kg (353 lb 1.6 oz), SpO2 95 %.  PHYSICAL EXAMINATION:  GENERAL:  57 y.o.-year-old patient lying in the bed with no acute distress.  EYES: Pupils equal, round, reactive to light and accommodation. No scleral icterus. Extraocular muscles intact.  HEENT: Head atraumatic, normocephalic. Oropharynx and nasopharynx clear.  NECK:  Supple, no jugular venous distention. No thyroid enlargement, no tenderness.  LUNGS: Normal breath sounds bilaterally, no wheezing, rales,rhonchi or crepitation. No use of accessory muscles of respiration.  CARDIOVASCULAR: S1, S2 normal. No murmurs, rubs, or gallops.  ABDOMEN: Soft, nontender, nondistended. Bowel sounds present. No organomegaly or mass.  EXTREMITIES: No pedal edema, cyanosis, or clubbing.  NEUROLOGIC: Cranial nerves II through XII are intact.  Muscle strength 5/5 in all extremities. Sensation intact. Gait not checked.  PSYCHIATRIC: The patient is alert and oriented x 3.  SKIN: No obvious rash, lesion, or ulcer.   Physical Exam LABORATORY PANEL:   CBC Recent Labs  Lab 12/15/17 0021  WBC 8.2  HGB 7.8*  HCT 21.9*  PLT 111*   ------------------------------------------------------------------------------------------------------------------  Chemistries  Recent Labs  Lab 12/14/17 0922  12/16/17 0423 12/16/17 1055  NA 123*   < > 123* 122*  K 3.5   < > 3.8  --   CL 92*   < > 94*  --   CO2 23   < > 23  --   GLUCOSE 117*   < > 117*  --   BUN 20   < > 23*  --   CREATININE 1.11   < > 1.39*  --   CALCIUM 8.0*   < > 8.5*  --   AST 84*  --   --   --   ALT 47  --   --   --   ALKPHOS 130*  --   --   --   BILITOT 5.8*  --   --   --    < > = values in this interval not displayed.   ------------------------------------------------------------------------------------------------------------------  Cardiac Enzymes Recent Labs  Lab 12/14/17 0922  TROPONINI <0.03   ------------------------------------------------------------------------------------------------------------------  RADIOLOGY:  No results found.  ASSESSMENT AND PLAN:  57 year old male with liver cirrhosis who presents to the ER after unwitnessed fall.  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 Management plans discussed with the patient, family and they are in agreement.  CODE STATUS: full  TOTAL TIME TAKING CARE OF THIS PATIENT: 35 minutes.     POSSIBLE D/C IN 1-3 DAYS, DEPENDING ON CLINICAL CONDITION.   Antonio York M.D on 12/16/2017   Between 7am to 6pm - Pager - [phone]  After 6pm go to [email or web address] - password [email or web address]  [facility]  Office  [phone]  CC: Primary care physician; Dimple Casey, MD  Note: This dictation was prepared with Dragon dictation along with smaller phrase technology. Any transcriptional errors that result from this process are unintentional.

## 2017-12-16 NOTE — Progress Notes (Signed)
Found pt with no transparent dressing over his port and needle.  Pt stated it was itching so he removed the dressing.  Explained to pt that it needs to stay sterile and not to remove the dressing.  Deaccessed the needle, replaced with a new 20 g needle using sterile technique, replaced line to IVF.  Educated the patient on leaving the IV tubing and dressing intact. Henriette Combs RN

## 2017-12-16 NOTE — Progress Notes (Signed)
[facility]  ROUNDING NOTE   Subjective:   Falling asleep midconversation.   Na 123  Objective:  Vital signs in last 24 hours:  Temp:  [97.7 F (36.5 C)-97.9 F (36.6 C)] 97.7 F (36.5 C) (05/13 0348) Pulse Rate:  [98-100] 100 (05/13 0348) Resp:  [18] 18 (05/13 0348) BP: (89-111)/(26-40) 111/40 (05/13 0348) SpO2:  [95 %-100 %] 95 % (05/13 0348) Weight:  [160.2 kg (353 lb 1.6 oz)] 160.2 kg (353 lb 1.6 oz) (05/13 0348)  Weight change: 2.767 kg (6 lb 1.6 oz) Filed Weights   12/14/17 1507 12/15/17 0504 12/16/17 0348  Weight: (!) 153.5 kg (338 lb 6.4 oz) (!) 162.5 kg (358 lb 3.2 oz) (!) 160.2 kg (353 lb 1.6 oz)    Intake/Output: I/O last 3 completed shifts: In: 1319.9 [P.O.:475; I.V.:544.9; IV Piggyback:300] Out: 825 [Urine:825]   Intake/Output this shift:  Total I/O In: 240 [P.O.:240] Out: -   Physical Exam: General: NAD, laying in bed, +Pickwickian  Head: Normocephalic, atraumatic. Moist oral mucosal membranes  Eyes: Anicteric, PERRL  Neck: Supple, trachea midline  Lungs:  Clear to auscultation  Heart: Regular rate and rhythm  Abdomen:  Obese, +abdominal wall edema  Extremities: 1-2+ peripheral edema.  Neurologic: Nonfocal, moving all four extremities  Skin: No lesions       Basic Metabolic Panel: Recent Labs  Lab 12/14/17 0922 12/15/17 0021 12/16/17 0423 12/16/17 1055  NA 123* 122* 123* 122*  K 3.5 3.5 3.8  --   CL 92* 94* 94*  --   CO2 --   GLUCOSE 117* 141* 117*  --   BUN 20 20 23*  --   CREATININE 1.11 1.11 1.39*  --   CALCIUM 8.0* 7.9* 8.5*  --     Liver Function Tests: Recent Labs  Lab 12/14/17 0922  AST 84*  ALT 47  ALKPHOS 130*  BILITOT 5.8*  PROT 5.6*  ALBUMIN 2.2*   No results for input(s): LIPASE, AMYLASE in the last 168 hours. Recent Labs  Lab 12/14/17 0923  AMMONIA 35    CBC: Recent Labs  Lab 12/14/17 0922 12/15/17 0021  WBC 5.8 8.2  HGB 8.1* 7.8*  HCT 23.1* 21.9*  MCV 102.9* 103.5*  PLT  103* 111*    Cardiac Enzymes: Recent Labs  Lab 12/14/17 0922  TROPONINI <0.03    BNP: Invalid input(s): POCBNP  CBG: No results for input(s): GLUCAP in the last 168 hours.  Microbiology: Results for orders placed or performed during the hospital encounter of 12/14/17  Blood culture (routine x 2)     Status: None (Preliminary result)   Collection Time: 12/14/17  9:22 AM  Result Value Ref Range Status   Specimen Description BLOOD R PORT  Final   Special Requests   Final    BOTTLES DRAWN AEROBIC AND ANAEROBIC Blood Culture results may not be optimal due to an excessive volume of blood received in culture bottles   Culture   Final    NO GROWTH 2 DAYS Performed at [facility], [address] [place], [place] [address]    Report Status PENDING  Incomplete  Blood culture (routine x 2)     Status: None (Preliminary result)   Collection Time: 12/14/17 12:58 PM  Result Value Ref Range Status   Specimen Description BLOOD RT [facility]  Final   Special Requests   Final    BOTTLES DRAWN AEROBIC AND ANAEROBIC Blood Culture adequate volume   Culture   Final  NO GROWTH 2 DAYS Performed at [facility], [address] [place], [place] [address]    Report Status PENDING  Incomplete  MRSA PCR Screening     Status: None   Collection Time: 12/14/17  3:42 PM  Result Value Ref Range Status   MRSA by PCR NEGATIVE NEGATIVE Final    Comment:        The GeneXpert MRSA Assay (FDA approved for NASAL specimens only), is one component of a comprehensive MRSA colonization surveillance program. It is not intended to diagnose MRSA infection nor to guide or monitor treatment for MRSA infections. Performed at [facility], [address] [place], [place] [address]   Urine culture     Status: Abnormal   Collection Time: 12/15/17 12:25 AM  Result Value Ref Range Status   Specimen Description   Final    URINE, RANDOM Performed at [facility], [address] [place], [place] [address]    Special Requests   Final    NONE Performed at [facility], [address] [place], [place] [address]    Culture (A)  Final    <10,000 COLONIES/mL INSIGNIFICANT GROWTH Performed at [facility], [address] [place], [place] [address]    Report Status 12/16/2017 FINAL  Final    Coagulation Studies: Recent Labs    12/14/17 0922  LABPROT 25.1*  INR 2.30    Urinalysis: Recent Labs    12/15/17 1143  COLORURINE AMBER*  LABSPEC 1.008  PHURINE 6.0  GLUCOSEU NEGATIVE  HGBUR NEGATIVE  BILIRUBINUR NEGATIVE  KETONESUR NEGATIVE  PROTEINUR NEGATIVE  NITRITE NEGATIVE  LEUKOCYTESUR NEGATIVE      Imaging: No results found.   Medications:   . albumin human     . enoxaparin (LOVENOX) injection  40 mg Subcutaneous Q12H  . lactulose  30 g Oral TID  . midodrine  5 mg Oral TID WC  . polyethylene glycol  17 g Oral QHS  . rifaximin  550 mg Oral BID  . spironolactone  25 mg Oral Daily  . torsemide  40 mg Oral Daily   acetaminophen **OR** acetaminophen, bisacodyl, oxyCODONE, polyethylene glycol, promethazine  Assessment/ Plan:  [name] is a 57 y.o. white male with cerebral palsy, diastolic heart failure, liver cirrhosis secondary to nonalcoholic fatty liver disease/hemochromatosis, lower extremity edema, generalized debility, who was admitted to [facility] on 12/14/2017 for evaluation of confusion.   1.  Hyponatremia. 2.  Hypotension. 3.  Liver cirrhosis secondary to Nash/hemochromatosis. 4.  Anemia unspecified.  Plan: Consistent with hypervolemic hypo-osmolar hyponatremia secondary to hepatic cirrhosis - Fluid restriction - Continue IV albumin - Discontinue IV normal saline - Discontinue sodium chloride tablet - Serial serum sodium checks - Restart torsemide  PO daily.  - Restart spironolactone  PO daily - Monitor blood pressure, Continue midodrine.    LOS: 2 Antonio York 5/13/201912:00 PM

## 2017-12-16 NOTE — Evaluation (Signed)
Physical Therapy Evaluation Patient Details Name: Antonio York MRN: [id number] DOB: July 26, 1961 Today's Date: 12/16/2017   History of Present Illness  57 yo male with onset of low na+ and recent fall in SNF was admitted for replenishment of Na+ and management of meds.  Noted encephalopathy, had shin laceration sutured from fall.  PMHx:  CP, CHF, NASH cirrhosis, hemochromatosis, asthma, COPD, morbid obesity  Clinical Impression  Pt was able to get to side of bed with assistance but is not comfortable with making the effort to stand.  He is from SNF and at that functional level with low support for mobility at home, but per chart is requesting to go directly home.  Will not support that plan as pt cannot even attempt standing with PT and could not even manage a urinal without help.  He is going to remain on PT caseload to work on transfers and safety with movement so he can decrease his need for ongoing rehab and get home safely.    Follow Up Recommendations SNF    Equipment Recommendations  None recommended by PT    Recommendations for Other Services       Precautions / Restrictions Precautions Precautions: Fall(telemetry, assistance needed for urinal) Restrictions Weight Bearing Restrictions: No Other Position/Activity Restrictions: painful with LLE laceration      Mobility  Bed Mobility Overal bed mobility: Needs Assistance Bed Mobility: Supine to Sit;Sit to Supine     Supine to sit: Mod assist Sit to supine: Min guard;Min assist   General bed mobility comments: pt required assist for trunk to sit side of bed but minor LE help back to bed  Transfers Overall transfer level: Needs assistance               General transfer comment: pt was unable to attempt standing as he had pain on LLE with dependent dangling on the side of bed  Ambulation/Gait             General Gait Details: deferred over leg pain  Stairs            Wheelchair Mobility    Modified  Rankin (Stroke Patients Only)       Balance Overall balance assessment: Needs assistance;History of Falls Sitting-balance support: Feet supported;Bilateral upper extremity supported Sitting balance-Leahy Scale: Fair Sitting balance - Comments: fair once set on side of bed   Standing balance support: (unable to try)                                 Pertinent Vitals/Pain Pain Assessment: Faces Faces Pain Scale: Hurts little more Pain Location: LLE laceration Pain Descriptors / Indicators: Operative site guarding Pain Intervention(s): Limited activity within patient's tolerance;Monitored during session;Premedicated before session;Repositioned    Home Living Family/patient expects to be discharged to:: Skilled nursing facility                 Additional Comments: pt was using hoyer and walking short trips on RW at SNF    Prior Function Level of Independence: Needs assistance   Gait / Transfers Assistance Needed: RW for short trips,   ADL's / Homemaking Assistance Needed: brothers come by to assist pt and his wife is disabled        Hand Dominance   Dominant Hand: Right    Extremity/Trunk Assessment   Upper Extremity Assessment Upper Extremity Assessment: Generalized weakness    Lower Extremity Assessment Lower Extremity Assessment:  Generalized weakness    Cervical / Trunk Assessment Cervical / Trunk Assessment: Normal  Communication   Communication: No difficulties  Cognition Arousal/Alertness: Lethargic Behavior During Therapy: Flat affect Overall Cognitive Status: No family/caregiver present to determine baseline cognitive functioning                                 General Comments: pt requires repetition of instructions and not sure if this is baseline      General Comments General comments (skin integrity, edema, etc.): LE bandage on LLE is wet with what may be drainage from edema    Exercises     Assessment/Plan     PT Assessment Patient needs continued PT services  PT Problem List Decreased strength;Decreased range of motion;Decreased activity tolerance;Decreased balance;Decreased mobility;Decreased coordination;Decreased safety awareness;Cardiopulmonary status limiting activity;Obesity;Decreased skin integrity;Pain       PT Treatment Interventions DME instruction;Gait training;Functional mobility training;Therapeutic activities;Therapeutic exercise;Balance training;Neuromuscular re-education;Patient/family education    PT Goals (Current goals can be found in the Care Plan section)  Acute Rehab PT Goals Patient Stated Goal: to get stronger and feel better, get home PT Goal Formulation: With patient Time For Goal Achievement: 12/30/17 Potential to Achieve Goals: Fair    Frequency Min 2X/week   Barriers to discharge Inaccessible home environment;Decreased caregiver support has limited assistance and cannot move well without help    Co-evaluation               AM-PAC PT "6 Clicks" Daily Activity  Outcome Measure Difficulty turning over in bed (including adjusting bedclothes, sheets and blankets)?: Unable Difficulty moving from lying on back to sitting on the side of the bed? : Unable Difficulty sitting down on and standing up from a chair with arms (e.g., wheelchair, bedside commode, etc,.)?: Unable Help needed moving to and from a bed to chair (including a wheelchair)?: A Lot Help needed walking in hospital room?: Total Help needed climbing 3-5 steps with a railing? : Total 6 Click Score: 7    End of Session   Activity Tolerance: Patient limited by fatigue;Patient limited by pain Patient left: in bed;with call bell/phone within reach;with bed alarm set Nurse Communication: Mobility status PT Visit Diagnosis: Unsteadiness on feet (R26.81);Other abnormalities of gait and mobility (R26.89);Muscle weakness (generalized) (M62.81);History of falling (Z91.81);Pain Pain - Right/Left:  Left Pain - part of body: Leg    Time: 1002-1027 PT Time Calculation (min) (ACUTE ONLY): 25 min   Charges:   PT Evaluation $PT Eval Moderate Complexity: 1 Mod PT Treatments $Therapeutic Activity: 8-22 mins   PT G Codes:   PT G-Codes **NOT FOR INPATIENT CLASS** Functional Assessment Tool Used: AM-PAC 6 Clicks Basic Mobility    Ivar Drape 12/16/2017, 11:34 AM   Samul Dada, PT MS Acute Rehab Dept. Number: [facility] [id number] and [facility] [phone]

## 2017-12-16 NOTE — Plan of Care (Signed)
  Problem: Education: Goal: Knowledge of General Education information will improve Outcome: Progressing   Problem: Health Behavior/Discharge Planning: Goal: Ability to manage health-related needs will improve Outcome: Progressing   Problem: Clinical Measurements: Goal: Ability to maintain clinical measurements within normal limits will improve Outcome: Progressing Goal: Will remain free from infection Outcome: Progressing Goal: Diagnostic test results will improve Outcome: Progressing Goal: Respiratory complications will improve Outcome: Progressing Goal: Cardiovascular complication will be avoided Outcome: Progressing   Problem: Elimination: Goal: Will not experience complications related to urinary retention Outcome: Progressing   Problem: Safety: Goal: Ability to remain free from injury will improve Outcome: Progressing

## 2017-12-17 DIAGNOSIS — E871 Hypo-osmolality and hyponatremia: Principal | ICD-10-CM

## 2017-12-17 DIAGNOSIS — Z7189 Other specified counseling: Secondary | ICD-10-CM

## 2017-12-17 DIAGNOSIS — Z515 Encounter for palliative care: Secondary | ICD-10-CM

## 2017-12-17 LAB — COMPREHENSIVE METABOLIC PANEL
ALBUMIN: 3.3 g/dL — AB (ref 3.5–5.0)
ALK PHOS: 109 U/L (ref 38–126)
ALT: 40 U/L (ref 17–63)
ANION GAP: 6 (ref 5–15)
AST: 70 U/L — ABNORMAL HIGH (ref 15–41)
BUN: 23 mg/dL — ABNORMAL HIGH (ref 6–20)
CALCIUM: 8.4 mg/dL — AB (ref 8.9–10.3)
CHLORIDE: 96 mmol/L — AB (ref 101–111)
CO2: 22 mmol/L (ref 22–32)
CREATININE: 1.11 mg/dL (ref 0.61–1.24)
GFR calc Af Amer: >60 mL/min (ref >60)
GFR calc non Af Amer: >60 mL/min (ref >60)
GLUCOSE: 102 mg/dL — AB (ref 65–99)
Potassium: 4.1 mmol/L (ref 3.5–5.1)
SODIUM: 124 mmol/L — AB (ref 135–145)
Total Bilirubin: 5.8 mg/dL — ABNORMAL HIGH (ref 0.3–1.2)
Total Protein: 5.9 g/dL — ABNORMAL LOW (ref 6.5–8.1)

## 2017-12-17 LAB — TYPE AND SCREEN
ABO/RH(D): O POS
ANTIBODY SCREEN: NEGATIVE

## 2017-12-17 LAB — CBC
HEMATOCRIT: 19.4 % — AB (ref 40.0–52.0)
HEMOGLOBIN: 6.7 g/dL — AB (ref 13.0–18.0)
MCH: 36.2 pg — AB (ref 26.0–34.0)
MCHC: 34.8 g/dL (ref 32.0–36.0)
MCV: 104.2 fL — AB (ref 80.0–100.0)
PLATELETS: 92 10*3/uL — AB (ref 150–440)
RBC: 1.86 MIL/uL — ABNORMAL LOW (ref 4.40–5.90)
RDW: 14.8 % — ABNORMAL HIGH (ref 11.5–14.5)
WBC: 6.5 10*3/uL (ref 3.8–10.6)

## 2017-12-17 LAB — PREPARE RBC (CROSSMATCH)

## 2017-12-17 LAB — ABO/RH: ABO/RH(D): O POS

## 2017-12-17 LAB — SODIUM
Sodium: 124 mmol/L — ABNORMAL LOW (ref 135–145)
Sodium: 124 mmol/L — ABNORMAL LOW (ref 135–145)
Sodium: 124 mmol/L — ABNORMAL LOW (ref 135–145)

## 2017-12-17 LAB — RPR: RPR Ser Ql: NONREACTIVE

## 2017-12-17 LAB — HEMOGLOBIN AND HEMATOCRIT, BLOOD
HCT: 21.6 % — ABNORMAL LOW (ref 40.0–52.0)
Hemoglobin: 7.6 g/dL — ABNORMAL LOW (ref 13.0–18.0)

## 2017-12-17 MED ORDER — MIDODRINE HCL 5 MG PO TABS
10.0000 mg | ORAL_TABLET | Freq: Three times a day (TID) | ORAL | Status: DC
  Administered 2017-12-17 – 2017-12-21 (×11): 10 mg via ORAL
  Filled 2017-12-17 (×14): qty 2

## 2017-12-17 MED ORDER — SODIUM CHLORIDE 0.9 % IV SOLN
Freq: Once | INTRAVENOUS | Status: AC
  Administered 2017-12-17: 12:00:00 via INTRAVENOUS

## 2017-12-17 NOTE — Progress Notes (Signed)
[facility]  ROUNDING NOTE   Subjective:   IV albumin q8 Spironolactone and torsemide.   Na 124  Objective:  Vital signs in last 24 hours:  Temp:  [98.1 F (36.7 C)-98.9 F (37.2 C)] 98.2 F (36.8 C) (05/14 1430) Pulse Rate:  [95-102] 98 (05/14 1430) Resp:  [18-20] 18 (05/14 1430) BP: (78-117)/(28-65) 98/42 (05/14 1430) SpO2:  [95 %-98 %] 98 % (05/14 1430) Weight:  [161.5 kg (356 lb)] 161.5 kg (356 lb) (05/14 0325)  Weight change: 1.315 kg (2 lb 14.4 oz) Filed Weights   12/15/17 0504 12/16/17 0348 12/17/17 0325  Weight: (!) 162.5 kg (358 lb 3.2 oz) (!) 160.2 kg (353 lb 1.6 oz) (!) 161.5 kg (356 lb)    Intake/Output: I/O last 3 completed shifts: In: 1739.2 [P.O.:840; I.V.:399.2; IV Piggyback:500] Out: 300 [Urine:300]   Intake/Output this shift:  Total I/O In: 300 [Blood:300] Out: -   Physical Exam: General: NAD, laying in bed  Head: Normocephalic, atraumatic. Moist oral mucosal membranes  Eyes: Anicteric, PERRL  Neck: Supple, trachea midline  Lungs:  Clear to auscultation  Heart: Regular rate and rhythm  Abdomen:  Obese, +abdominal wall edema  Extremities: 1-2+ peripheral edema.  Neurologic: Nonfocal, moving all four extremities  Skin: No lesions       Basic Metabolic Panel: Recent Labs  Lab 12/14/17 0922 12/15/17 0021 12/16/17 0423 12/16/17 1055 12/16/17 1644 12/17/17 0226 12/17/17 0323 12/17/17 1055  NA 123* 122* 123* 122* 124* 124* 124* 124*  K 3.5 3.5 3.8  --   --   --  4.1  --   CL 92* 94* 94*  --   --   --  96*  --   CO2 --   --   --  22  --   GLUCOSE 117* 141* 117*  --   --   --  102*  --   BUN 20 20 23*  --   --   --  23*  --   CREATININE 1.11 1.11 1.39*  --   --   --  1.11  --   CALCIUM 8.0* 7.9* 8.5*  --   --   --  8.4*  --     Liver Function Tests: Recent Labs  Lab 12/14/17 0922 12/17/17 0323  AST 84* 70*  ALT 47 40  ALKPHOS 130* 109  BILITOT 5.8* 5.8*  PROT 5.6* 5.9*  ALBUMIN 2.2* 3.3*   No results  for input(s): LIPASE, AMYLASE in the last 168 hours. Recent Labs  Lab 12/14/17 0923 12/16/17 1314  AMMONIA 35 71*    CBC: Recent Labs  Lab 12/14/17 0922 12/15/17 0021 12/17/17 0323  WBC 5.8 8.2 6.5  HGB 8.1* 7.8* 6.7*  HCT 23.1* 21.9* 19.4*  MCV 102.9* 103.5* 104.2*  PLT 103* 111* 92*    Cardiac Enzymes: Recent Labs  Lab 12/14/17 0922  TROPONINI <0.03    BNP: Invalid input(s): POCBNP  CBG: No results for input(s): GLUCAP in the last 168 hours.  Microbiology: Results for orders placed or performed during the hospital encounter of 12/14/17  Blood culture (routine x 2)     Status: None (Preliminary result)   Collection Time: 12/14/17  9:22 AM  Result Value Ref Range Status   Specimen Description BLOOD R PORT  Final   Special Requests   Final    BOTTLES DRAWN AEROBIC AND ANAEROBIC Blood Culture results may not be optimal due to an excessive volume of blood received in  culture bottles   Culture   Final    NO GROWTH 3 DAYS Performed at [facility], [address] [place], [place] [address]    Report Status PENDING  Incomplete  Blood culture (routine x 2)     Status: None (Preliminary result)   Collection Time: 12/14/17 12:58 PM  Result Value Ref Range Status   Specimen Description BLOOD RT [facility]  Final   Special Requests   Final    BOTTLES DRAWN AEROBIC AND ANAEROBIC Blood Culture adequate volume   Culture   Final    NO GROWTH 3 DAYS Performed at [facility], [address] [place], [place] [address]    Report Status PENDING  Incomplete  MRSA PCR Screening     Status: None   Collection Time: 12/14/17  3:42 PM  Result Value Ref Range Status   MRSA by PCR NEGATIVE NEGATIVE Final    Comment:        The GeneXpert MRSA Assay (FDA approved for NASAL specimens only), is one component of a comprehensive MRSA colonization surveillance program. It is not intended to diagnose MRSA infection nor to guide or monitor treatment for MRSA  infections. Performed at [facility], [address] [place], [place] [address]   Urine culture     Status: Abnormal   Collection Time: 12/15/17 12:25 AM  Result Value Ref Range Status   Specimen Description   Final    URINE, RANDOM Performed at [facility], [address] [place], [place] [address]    Special Requests   Final    NONE Performed at [facility], [address] [place], [place] [address]    Culture (A)  Final    <10,000 COLONIES/mL INSIGNIFICANT GROWTH Performed at [facility], [address], [place], [place] [address]    Report Status 12/16/2017 FINAL  Final    Coagulation Studies: No results for input(s): LABPROT, INR in the last 72 hours.  Urinalysis: Recent Labs    12/15/17 1143  COLORURINE AMBER*  LABSPEC 1.008  PHURINE 6.0  GLUCOSEU NEGATIVE  HGBUR NEGATIVE  BILIRUBINUR NEGATIVE  KETONESUR NEGATIVE  PROTEINUR NEGATIVE  NITRITE NEGATIVE  LEUKOCYTESUR NEGATIVE      Imaging: No results found.   Medications:    . enoxaparin (LOVENOX) injection  40 mg Subcutaneous Q12H  . lactulose  30 g Oral TID  . midodrine  10 mg Oral TID WC  . polyethylene glycol  17 g Oral QHS  . rifaximin  550 mg Oral BID  . senna-docusate  2 tablet Oral QHS  . spironolactone  25 mg Oral Daily  . torsemide  40 mg Oral Daily   acetaminophen **OR** acetaminophen, bisacodyl, oxyCODONE, polyethylene glycol, promethazine  Assessment/ Plan:  Antonio York is a 57 y.o. white male with cerebral palsy, diastolic heart failure, liver cirrhosis secondary to nonalcoholic fatty liver disease/hemochromatosis, lower extremity edema, generalized debility, who was admitted to [facility] on 12/14/2017 for evaluation of confusion.   1.  Hyponatremia. 2.  Hypotension. 3.  Liver cirrhosis secondary to Nash/hemochromatosis. MELD score on 5/14 of  4.  Anemia unspecified.  Plan: Consistent with hypervolemic hypo-osmolar hyponatremia secondary  to hepatic cirrhosis - Fluid restriction - Completed 3 days of IV albumin - Serial serum sodium checks - Continue torsemide  PO daily.  - Continue spironolactone  PO daily - Monitor blood pressure, Continue midodrine.    LOS: 3 Guneet Delpino 5/14/20193:07 PM

## 2017-12-17 NOTE — Progress Notes (Signed)
[facility] [place] [facility]   PATIENT NAME: Antonio York    MR#:  [id number]  DATE OF BIRTH:  12-26-1960  SUBJECTIVE:  Patient doing okay this morning  REVIEW OF SYSTEMS:    Review of Systems  Constitutional: Negative for fever, chills weight loss HENT: Negative for ear pain, nosebleeds, congestion, facial swelling, rhinorrhea, neck pain, neck stiffness and ear discharge.   Respiratory: Negative for cough, shortness of breath, wheezing  Cardiovascular: Negative for chest pain, palpitations and positive for leg swelling.  Gastrointestinal: Negative for heartburn, abdominal pain, vomiting, diarrhea or ++consitpation Genitourinary: Negative for dysuria, urgency, frequency, hematuria Musculoskeletal: Negative for back pain or joint pain Neurological: Negative for dizziness, seizures, syncope, focal weakness,  numbness and headaches.  Hematological: Does not bruise/bleed easily.  Psychiatric/Behavioral: Negative for hallucinations, confusion, dysphoric mood    Tolerating Diet: yes      DRUG ALLERGIES:  No Known Allergies  VITALS:  Blood pressure (!) 99/44, pulse 99, temperature 98.3 F (36.8 C), temperature source Oral, resp. rate 18, height  (1.676 m), weight (!) 161.5 kg (356 lb), SpO2 97 %.  PHYSICAL EXAMINATION:  Constitutional: Appears overweight. No distress. HENT: Normocephalic. Marland Kitchen Oropharynx is clear and moist.  Eyes: Conjunctivae and EOM are normal. PERRLA, no scleral icterus.  Neck: Normal ROM. Neck supple. No JVD. No tracheal deviation. CVS: RRR, S1/S2 +, no murmurs, no gallops, no carotid bruit.  Pulmonary: Effort and breath sounds normal, no stridor, rhonchi, wheezes, rales.  Abdominal: Soft. BS +,  no distension, tenderness, rebound or guarding.  Musculoskeletal: Normal range of motion. 4+ LEE and tenderness.  Neuro: Alert. CN 2-12 grossly intact. No focal deficits. Skin: Skin is warm and dry. No rash noted. Psychiatric: Normal  mood and affect.      LABORATORY PANEL:   CBC Recent Labs  Lab 12/17/17 0323  WBC 6.5  HGB 6.7*  HCT 19.4*  PLT 92*   ------------------------------------------------------------------------------------------------------------------  Chemistries  Recent Labs  Lab 12/17/17 0323  NA 124*  K 4.1  CL 96*  CO2 22  GLUCOSE 102*  BUN 23*  CREATININE 1.11  CALCIUM 8.4*  AST 70*  ALT 40  ALKPHOS 109  BILITOT 5.8*   ------------------------------------------------------------------------------------------------------------------  Cardiac Enzymes Recent Labs  Lab 12/14/17 0922  TROPONINI <0.03   ------------------------------------------------------------------------------------------------------------------  RADIOLOGY:  No results found.   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  1.  Acute encephalopathy: Patient appears to be at baseline.  Ammonia level is elevated. He may have some hepatic encephalopathy initially. Continue Xifaxan and lactulose.  2.  Hyponatremia: This is due to hypervolemia from liver cirrhosis Blood pressure low this morning Patient restarted on diuretics which will be held for now due to low blood pressure. 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  Acute on chronic blood loss anemia: Patient is consented for blood transfusion which hopefully will help his blood pressure   4.  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  History of hemochromatosis: Patient has not required phlebotomy  recently  6.  Left shin laceration after fall which  was sutured by ED physician upon admission Sutures to be removed on May 25  Management plans discussed with the patient and he is in agreement.  Palliative care consultation for goals of care  CODE STATUS: Full  TOTAL TIME TAKING CARE OF THIS PATIENT: 24 minutes.     POSSIBLE D/C 2-4 days, DEPENDING ON CLINICAL CONDITION.   Serena Petterson M.D on 12/17/2017 at 9:21 AM  Between 7am to 6pm - Pager - [phone] After 6pm go to [email or web address] - password [email or web address]  [facility]  Office  [phone]  CC: Primary care physician; Dimple Casey, MD  Note: This dictation was prepared with Dragon dictation along with smaller phrase technology. Any transcriptional errors that result from this process are unintentional.

## 2017-12-17 NOTE — Consult Note (Signed)
WOC Nurse wound consult note Reason for Consult: Weeping lower extremity on left.  Full thickness laceration sustained from fall measuring 14cm and with 24 staples. Weeping from distal third of left LE. Wound type: Venous insufficiency in the presence of numerous chronic diseases Pressure Injury POA: NA Measurement:As noted above, pinpoint areas are weeping. Wound ZOX:WRUE, moist Drainage (amount, consistency, odor) large to copious amounts of light yellow serous exudate Periwound:macerated, hemosiderin staining, edema Dressing procedure/placement/frequency: I will provide a mattress replacement with low air loss feature for management of microclimate as well as pressure redistribution.  Conservative wound care orders are provided using a superabsorbent antimicrobial over the wound on the left LE and topped with a dry boot (Kerlix and ACE) applied to both LEs.  Pressure redistribution boots are provided for foot alignment and heel elevation.  WOC nursing team will not follow, but will remain available to this patient, the nursing and medical teams.  Please re-consult if needed. Thanks, Ladona Mow, MSN, RN, GNP, [facility]  Pager# [phone]

## 2017-12-17 NOTE — Consult Note (Signed)
Consultation Note Date: 12/17/2017   Patient Name: Antonio York  DOB: 24-Apr-1961  MRN: [id number]  Age / Sex: 57 y.o., male  PCP: Dimple Casey, MD Referring Physician: Adrian Saran, MD  Reason for Consultation: Establishing goals of care  HPI/Patient Profile: Antonio York  is a 57 y.o. male with a known history of cerebral palsy, diastolic heart failure, liver cirrhosis due to Nash/hemochromatosis who presents from [facility] due to unwitnessed fall and confusion.  Patient reports that yesterday the MD at the facility told him the sodium level was low.    Clinical Assessment and Goals of Care: Mr. Grow lives with his wife of 34 years. He tells me his daughter died of CP 11 years ago and was 17 at the time. He states his wife also has health problems.  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  He states that a year ago he needed no assistance, however as of a year ago began using a walker, and began spending most of his time in bed due to weakness in his legs.  He states he has 2 brothers that have been helping him at home, however 1 of them is no longer able to assist due to back problems, and the other has just had a stroke and is doing physical therapy himself to try to regain strength.  He tells me that he was admitted to [facility] recently, and discharged to a skilled nursing facility where he was prior to this admission.  He states that he is only seen his wife one time in the last 2 months.  He tells me that he fell at the facility prior to admission here because he was trying to get up to get some water.  Prior to his admission here he states he was able to walk 135 feet with his walker, and for him he states this is much improvement.  He tells me his goal is to be able to walk again either with or without a walker, and to be able to drive again.   He states that the chaplain came by and brought him a copy of the living will to be completed. We discussed his diagnoses, prognosis, GOC, EOL wishes disposition and options.  A detailed discussion was had today regarding advanced directives.  Concepts specific to code status, artifical feeding and hydration, IV antibiotics and rehospitalization was had.  The difference between an aggressive medical intervention path and a hospice comfort care path was discussed.  Values and goals of care important to patient and family were attempted to be elicited. Natural trajectory and expectations were discussed.  He tells me that he would want to have dialysis and a feeding tube if those were needed, and he can have a meaningful life.  He states that he would never want to be a vegetable and would be willing to be placed on the ventilator, but would never want to have a tracheostomy.  He states  that he would never want chest compressions shocks or breathing tube placed if his heart and lungs were to stop.    His wife was present via phone, and all questions were answered for he and his wife.  A MOST form was completed for DNR, full scope of treatment (no tracheostomy), antibiotics okay, IV fluids okay, feeding tube okay.      SUMMARY OF RECOMMENDATIONS    DNR, no trach.  Would want to be intubated for respiratory distress.  Recommend palliative care to follow outpatient.  Palliative Prophylaxis:   Eye Care and Oral Care  Additional Recommendations (Limitations, Scope, Preferences):  No Tracheostomy  Psycho-social/Spiritual:   Desire for further Chaplaincy support: Advanced directives packet.  Prognosis:  Poor long-term  Discharge Planning: To Be Determined      Primary Diagnoses: Present on Admission: . Hyponatremia   I have reviewed the medical record, interviewed the patient and family, and examined the patient. The following aspects are pertinent.  Past Medical History:    Diagnosis Date  . Asthma   . Cerebral palsy (HCC)   . CHF (congestive heart failure) (HCC)   . COPD (chronic obstructive pulmonary disease) (HCC)   . Liver cirrhosis (HCC)    Social History   Socioeconomic History  . Marital status: Married    Spouse name: Not on file  . Number of children: Not on file  . Years of education: Not on file  . Highest education level: Not on file  Occupational History  . Not on file  Social Needs  . Financial resource strain: Not hard at all  . Food insecurity:    Worry: Never true    Inability: Never true  . Transportation needs:    Medical: No    Non-medical: No  Tobacco Use  . Smoking status: Never Smoker  . Smokeless tobacco: Never Used  Substance and Sexual Activity  . Alcohol use: Not Currently  . Drug use: Not Currently  . Sexual activity: Not Currently  Lifestyle  . Physical activity:    Days per week: 0 days    Minutes per session: 0 min  . Stress: Not at all  Relationships  . Social connections:    Talks on phone: More than three times a week    Gets together: Once a week    Attends religious service: Never    Active member of club or organization: No    Attends meetings of clubs or organizations: Never    Relationship status: Married  Other Topics Concern  . Not on file  Social History Narrative   Lives at [facility] care center, married.   History reviewed. No pertinent family history. Scheduled Meds: . enoxaparin (LOVENOX) injection  40 mg Subcutaneous Q12H  . lactulose  30 g Oral TID  . midodrine  10 mg Oral TID WC  . polyethylene glycol  17 g Oral QHS  . rifaximin  550 mg Oral BID  . senna-docusate  2 tablet Oral QHS  . spironolactone  25 mg Oral Daily  . torsemide  40 mg Oral Daily   Continuous Infusions: PRN Meds:.acetaminophen **OR** acetaminophen, bisacodyl, oxyCODONE, polyethylene glycol, promethazine Medications Prior to Admission:  Prior to Admission medications   Medication Sig Start Date End  Date Taking? Authorizing Provider  Diphenhyd-Hydrocort-Nystatin (FIRST-DUKES MOUTHWASH) SUSP Swish and swallow 5 mLs 3 (three) times daily before meals.   Yes [provider]  hydrocortisone cream 1 % Apply 1 application topically every 6 (six) hours as needed for  itching. Apply to abdomen.   Yes [provider]  lactulose (CHRONULAC) 10 GM/15ML solution Take 45 mLs by mouth 3 (three) times daily. 11/17/17 12/17/17 Yes [provider]  polyethylene glycol (MIRALAX / GLYCOLAX) packet Take 17 g by mouth daily.   Yes [provider]  promethazine (PHENERGAN) 12.5 MG tablet Take 12.5 mg by mouth every 6 (six) hours as needed for nausea/vomiting.   Yes [provider]  spironolactone (ALDACTONE) 100 MG tablet Take 300 mg by mouth daily. 11/08/17  Yes [provider]  torsemide (DEMADEX) 20 MG tablet Take 100 mg by mouth daily. 11/08/17  Yes [provider]  XIFAXAN 550 MG TABS tablet Take 550 mg by mouth 2 (two) times daily. 10/16/17  Yes [provider]   No Known Allergies Review of Systems  Physical Exam  Vital Signs: BP (!) 78/28 (BP Location: Right Wrist)   Pulse 100   Temp 98.7 F (37.1 C) (Oral)   Resp 20   Ht  (1.676 m)   Wt (!) 161.5 kg (356 lb)   SpO2 98%   BMI 57.46 kg/m  Pain Scale: 0-10   Pain Score: 0-No pain   SpO2: SpO2: 98 % O2 Device:SpO2: 98 % O2 Flow Rate: .   IO: Intake/output summary:   Intake/Output Summary (Last 24 hours) at 12/17/2017 1407 Last data filed at 12/17/2017 1223 Gross per 24 hour  Intake 540 ml  Output -  Net 540 ml    LBM: Last BM Date: 12/13/17 Baseline Weight: Weight: (!) 157.4 kg (347 lb) Most recent weight: Weight: (!) 161.5 kg (356 lb)     Palliative Assessment/Data:30%     Time In: 10:30 Time Out: 11:40 Time Total: 70 min Greater than 50%  of this time was spent counseling and coordinating care related to the above assessment and plan.  Signed  by: Morton Stall, NP   Please contact Palliative Medicine Team phone at [phone] for questions and concerns.  For individual provider: See Loretha Stapler

## 2017-12-18 DIAGNOSIS — K7460 Unspecified cirrhosis of liver: Secondary | ICD-10-CM

## 2017-12-18 LAB — CBC
HCT: 21.5 % — ABNORMAL LOW (ref 40.0–52.0)
Hemoglobin: 7.7 g/dL — ABNORMAL LOW (ref 13.0–18.0)
MCH: 36.6 pg — AB (ref 26.0–34.0)
MCHC: 35.7 g/dL (ref 32.0–36.0)
MCV: 102.6 fL — ABNORMAL HIGH (ref 80.0–100.0)
PLATELETS: 96 10*3/uL — AB (ref 150–440)
RBC: 2.09 MIL/uL — ABNORMAL LOW (ref 4.40–5.90)
RDW: 16.3 % — ABNORMAL HIGH (ref 11.5–14.5)
WBC: 7.7 10*3/uL (ref 3.8–10.6)

## 2017-12-18 LAB — RETICULOCYTES
RBC.: 2.16 MIL/uL — ABNORMAL LOW (ref 4.40–5.90)
Retic Count, Absolute: 121 10*3/uL (ref 19.0–183.0)
Retic Ct Pct: 5.6 % — ABNORMAL HIGH (ref 0.4–3.1)

## 2017-12-18 LAB — BPAM RBC
BLOOD PRODUCT EXPIRATION DATE: 201906062359
ISSUE DATE / TIME: 201905141216
UNIT TYPE AND RH: 5100

## 2017-12-18 LAB — VITAMIN B12: Vitamin B-12: 1194 pg/mL — ABNORMAL HIGH (ref 180–914)

## 2017-12-18 LAB — TYPE AND SCREEN
ABO/RH(D): O POS
ANTIBODY SCREEN: NEGATIVE
Unit division: 0

## 2017-12-18 LAB — BASIC METABOLIC PANEL
Anion gap: 8 (ref 5–15)
BUN: 25 mg/dL — AB (ref 6–20)
CO2: 22 mmol/L (ref 22–32)
CREATININE: 1.19 mg/dL (ref 0.61–1.24)
Calcium: 8.7 mg/dL — ABNORMAL LOW (ref 8.9–10.3)
Chloride: 95 mmol/L — ABNORMAL LOW (ref 101–111)
GFR calc Af Amer: >60 mL/min (ref >60)
GLUCOSE: 99 mg/dL (ref 65–99)
Potassium: 3.9 mmol/L (ref 3.5–5.1)
SODIUM: 125 mmol/L — AB (ref 135–145)

## 2017-12-18 LAB — AMMONIA: AMMONIA: 79 umol/L — AB (ref 9–35)

## 2017-12-18 LAB — IRON AND TIBC
IRON: 117 ug/dL (ref 45–182)
Saturation Ratios: 85 % — ABNORMAL HIGH (ref 17.9–39.5)
TIBC: 137 ug/dL — AB (ref 250–450)
UIBC: 20 ug/dL

## 2017-12-18 LAB — FERRITIN: FERRITIN: 221 ng/mL (ref 24–336)

## 2017-12-18 LAB — SODIUM
SODIUM: 125 mmol/L — AB (ref 135–145)
SODIUM: 125 mmol/L — AB (ref 135–145)
Sodium: 125 mmol/L — ABNORMAL LOW (ref 135–145)

## 2017-12-18 LAB — FOLATE: Folate: 6.6 ng/mL (ref >5.9)

## 2017-12-18 MED ORDER — PANTOPRAZOLE SODIUM 40 MG PO TBEC
40.0000 mg | DELAYED_RELEASE_TABLET | Freq: Every day | ORAL | Status: DC
  Administered 2017-12-18 – 2017-12-21 (×4): 40 mg via ORAL
  Filled 2017-12-18 (×4): qty 1

## 2017-12-18 MED ORDER — FUROSEMIDE 10 MG/ML IJ SOLN
10.0000 mg/h | INTRAVENOUS | Status: DC
  Administered 2017-12-18 – 2017-12-19 (×2): 10 mg/h via INTRAVENOUS
  Filled 2017-12-18 (×3): qty 25

## 2017-12-18 MED ORDER — BISACODYL 5 MG PO TBEC
5.0000 mg | DELAYED_RELEASE_TABLET | Freq: Every day | ORAL | Status: DC
  Administered 2017-12-19 – 2017-12-21 (×3): 5 mg via ORAL
  Filled 2017-12-18 (×3): qty 1

## 2017-12-18 MED ORDER — ALBUMIN HUMAN 25 % IV SOLN
25.0000 g | Freq: Once | INTRAVENOUS | Status: AC
  Administered 2017-12-18: 13:00:00 25 g via INTRAVENOUS
  Filled 2017-12-18: qty 100

## 2017-12-18 MED ORDER — GUAIFENESIN 100 MG/5ML PO SOLN
5.0000 mL | ORAL | Status: DC | PRN
  Administered 2017-12-21 (×2): 100 mg via ORAL
  Filled 2017-12-18 (×3): qty 5

## 2017-12-18 MED ORDER — LACTULOSE 10 GM/15ML PO SOLN
30.0000 g | Freq: Four times a day (QID) | ORAL | Status: DC
  Administered 2017-12-18 – 2017-12-21 (×11): 30 g via ORAL
  Filled 2017-12-18 (×11): qty 60

## 2017-12-18 MED ORDER — SENNOSIDES-DOCUSATE SODIUM 8.6-50 MG PO TABS
2.0000 | ORAL_TABLET | Freq: Two times a day (BID) | ORAL | Status: DC
  Administered 2017-12-18 – 2017-12-21 (×7): 2 via ORAL
  Filled 2017-12-18 (×7): qty 2

## 2017-12-18 MED ORDER — FUROSEMIDE 10 MG/ML IJ SOLN
80.0000 mg | Freq: Once | INTRAMUSCULAR | Status: AC
  Administered 2017-12-18: 80 mg via INTRAVENOUS
  Filled 2017-12-18: qty 8

## 2017-12-18 NOTE — Progress Notes (Signed)
[facility]  ROUNDING NOTE   Subjective:   Na 125 hgb 7.7 - PRBC transfusion yesterday.    Objective:  Vital signs in last 24 hours:  Temp:  [98.2 F (36.8 C)-99.9 F (37.7 C)] 99.9 F (37.7 C) (05/15 1258) Pulse Rate:  [92-103] 103 (05/15 1258) Resp:  [18-20] 20 (05/15 1258) BP: (88-107)/(30-48) 96/37 (05/15 1258) SpO2:  [95 %-98 %] 97 % (05/15 1258) Weight:  [161.8 kg (356 lb 11.2 oz)] 161.8 kg (356 lb 11.2 oz) (05/15 0351)  Weight change: 0.318 kg (11.2 oz) Filed Weights   12/16/17 0348 12/17/17 0325 12/18/17 0351  Weight: (!) 160.2 kg (353 lb 1.6 oz) (!) 161.5 kg (356 lb) (!) 161.8 kg (356 lb 11.2 oz)    Intake/Output: I/O last 3 completed shifts: In: 840 [P.O.:240; Blood:300; IV Piggyback:300] Out: -    Intake/Output this shift:  No intake/output data recorded.  Physical Exam: General: NAD, laying in bed  Head: Normocephalic, atraumatic. Moist oral mucosal membranes  Eyes: Anicteric, PERRL  Neck: Supple, trachea midline  Lungs:  Clear to auscultation  Heart: Regular rate and rhythm  Abdomen:  Obese, +abdominal wall edema  Extremities: 1-2+ peripheral edema.  Neurologic: Nonfocal, moving all four extremities  Skin: No lesions       Basic Metabolic Panel: Recent Labs  Lab 12/14/17 0922 12/15/17 0021 12/16/17 0423  12/17/17 0323 12/17/17 1055 12/17/17 1528 12/18/17 0002 12/18/17 0346 12/18/17 1049  NA 123* 122* 123*   < > 124* 124* 124* 125* 125* 125*  K 3.5 3.5 3.8  --  4.1  --   --   --  3.9  --   CL 92* 94* 94*  --  96*  --   --   --  95*  --   CO2 --  22  --   --   --  22  --   GLUCOSE 117* 141* 117*  --  102*  --   --   --  99  --   BUN 20 20 23*  --  23*  --   --   --  25*  --   CREATININE 1.11 1.11 1.39*  --  1.11  --   --   --  1.19  --   CALCIUM 8.0* 7.9* 8.5*  --  8.4*  --   --   --  8.7*  --    < > = values in this interval not displayed.    Liver Function Tests: Recent Labs  Lab 12/14/17 0922  12/17/17 0323  AST 84* 70*  ALT 47 40  ALKPHOS 130* 109  BILITOT 5.8* 5.8*  PROT 5.6* 5.9*  ALBUMIN 2.2* 3.3*   No results for input(s): LIPASE, AMYLASE in the last 168 hours. Recent Labs  Lab 12/14/17 0923 12/16/17 1314 12/18/17 1125  AMMONIA 35 71* 79*    CBC: Recent Labs  Lab 12/14/17 0922 12/15/17 0021 12/17/17 0323 12/17/17 1528 12/18/17 0346  WBC 5.8 8.2 6.5  --  7.7  HGB 8.1* 7.8* 6.7* 7.6* 7.7*  HCT 23.1* 21.9* 19.4* 21.6* 21.5*  MCV 102.9* 103.5* 104.2*  --  102.6*  PLT 103* 111* 92*  --  96*    Cardiac Enzymes: Recent Labs  Lab 12/14/17 0922  TROPONINI <0.03    BNP: Invalid input(s): POCBNP  CBG: No results for input(s): GLUCAP in the last 168 hours.  Microbiology: Results for orders placed or performed during the hospital encounter of 12/14/17  Blood culture (routine x 2)     Status: None (Preliminary result)   Collection Time: 12/14/17  9:22 AM  Result Value Ref Range Status   Specimen Description BLOOD R PORT  Final   Special Requests   Final    BOTTLES DRAWN AEROBIC AND ANAEROBIC Blood Culture results may not be optimal due to an excessive volume of blood received in culture bottles   Culture   Final    NO GROWTH 4 DAYS Performed at [facility], [address] [place], [place] [address]    Report Status PENDING  Incomplete  Blood culture (routine x 2)     Status: None (Preliminary result)   Collection Time: 12/14/17 12:58 PM  Result Value Ref Range Status   Specimen Description BLOOD RT [facility]  Final   Special Requests   Final    BOTTLES DRAWN AEROBIC AND ANAEROBIC Blood Culture adequate volume   Culture   Final    NO GROWTH 4 DAYS Performed at [facility], [address] [place], [place] [address]    Report Status PENDING  Incomplete  MRSA PCR Screening     Status: None   Collection Time: 12/14/17  3:42 PM  Result Value Ref Range Status   MRSA by PCR NEGATIVE NEGATIVE Final    Comment:        The GeneXpert  MRSA Assay (FDA approved for NASAL specimens only), is one component of a comprehensive MRSA colonization surveillance program. It is not intended to diagnose MRSA infection nor to guide or monitor treatment for MRSA infections. Performed at [facility], [address], [place], [place] [address]   Urine culture     Status: Abnormal   Collection Time: 12/15/17 12:25 AM  Result Value Ref Range Status   Specimen Description   Final    URINE, RANDOM Performed at [facility], [address], [place], [place] [address]    Special Requests   Final    NONE Performed at [facility], [address] [place], [place] [address]    Culture (A)  Final    <10,000 COLONIES/mL INSIGNIFICANT GROWTH Performed at [facility], [address], [place], [place] [address]    Report Status 12/16/2017 FINAL  Final    Coagulation Studies: No results for input(s): LABPROT, INR in the last 72 hours.  Urinalysis: No results for input(s): COLORURINE, LABSPEC, PHURINE, GLUCOSEU, HGBUR, BILIRUBINUR, KETONESUR, PROTEINUR, UROBILINOGEN, NITRITE, LEUKOCYTESUR in the last 72 hours.  Invalid input(s): APPERANCEUR    Imaging: No results found.   Medications:   . furosemide (LASIX) infusion 10 mg/hr (12/18/17 1229)   . [START ON 12/19/2017] bisacodyl  5 mg Oral Daily  . enoxaparin (LOVENOX) injection  40 mg Subcutaneous Q12H  . lactulose  30 g Oral QID  . midodrine  10 mg Oral TID WC  . pantoprazole  40 mg Oral Daily  . polyethylene glycol  17 g Oral QHS  . rifaximin  550 mg Oral BID  . senna-docusate  2 tablet Oral BID  . spironolactone  25 mg Oral Daily   acetaminophen **OR** acetaminophen, guaiFENesin, oxyCODONE, polyethylene glycol, promethazine  Assessment/ Plan:  [name] is a 57 y.o. white male with cerebral palsy, diastolic heart failure, liver cirrhosis secondary to nonalcoholic fatty liver disease/hemochromatosis, lower extremity edema,  generalized debility, who was admitted to [facility] on 12/14/2017 for evaluation of confusion.   1.  Hyponatremia. 2.  Hypotension. 3.  Liver cirrhosis secondary to  Nash/hemochromatosis. MELD score on 5/14 of 30 4.  Anemia unspecified.  Plan: Consistent with hypervolemic hypo-osmolar hyponatremia secondary to hepatic cirrhosis - Fluid restriction - Completed 3 days of IV albumin. One extra dose today - Serial serum sodium checks - Continue spironolactone - Start IV furosemide bolus  x 1 and then furosemide gtt at /hr - Monitor blood pressure, Continue midodrine.    LOS: 4 Antonio York 5/15/20192:24 PM

## 2017-12-18 NOTE — Progress Notes (Signed)
Physical Therapy Treatment Patient Details Name: Antonio York MRN: [id number] DOB: 20-Nov-1960 Today's Date: 12/18/2017    History of Present Illness 57 yo male with onset of low na+ and recent fall in SNF was admitted for replenishment of Na+ and management of meds.  Noted encephalopathy, had shin laceration sutured from fall.  PMHx:  CP, CHF, NASH cirrhosis, hemochromatosis, asthma, COPD, morbid obesity    PT Comments    Pt initially refuses PT feeling unable and mildly anxious due to the fact pt states he is unable to use the bathroom. With gentle encouragement, pt agreeable to supine bed exercises only. Pt demonstrates very limited movement with exercises and requires assist throughout. Continue PT to improve participation, LE/trunk range and strength to improve all functional mobility.    Follow Up Recommendations  SNF     Equipment Recommendations       Recommendations for Other Services       Precautions / Restrictions Precautions Precautions: Fall Restrictions Weight Bearing Restrictions: No    Mobility  Bed Mobility               General bed mobility comments: Not tested; Pt refuses, feels unable  Transfers                    Ambulation/Gait                 Stairs             Wheelchair Mobility    Modified Rankin (Stroke Patients Only)       Balance                                            Cognition Arousal/Alertness: Awake/alert Behavior During Therapy: WFL for tasks assessed/performed(concerned; mildly anxious due to inability to go to bathroom) Overall Cognitive Status: No family/caregiver present to determine baseline cognitive functioning                                        Exercises General Exercises - Lower Extremity Ankle Circles/Pumps: AROM;AAROM;Both;20 reps;Supine Quad Sets: Strengthening;Both;20 reps;Supine Gluteal Sets: Strengthening;Both;20 reps;Supine Heel Slides:  AAROM;Both;20 reps;Supine Hip ABduction/ADduction: AAROM;Both;20 reps;Supine    General Comments        Pertinent Vitals/Pain Pain Assessment: 0-10(mild in abdomen as well) Pain Score: 6  Pain Location: LLE laceration Pain Intervention(s): Monitored during session    Home Living                      Prior Function            PT Goals (current goals can now be found in the care plan section) Progress towards PT goals: Not progressing toward goals - comment    Frequency    Min 2X/week      PT Plan Current plan remains appropriate    Co-evaluation              AM-PAC PT "6 Clicks" Daily Activity  Outcome Measure  Difficulty turning over in bed (including adjusting bedclothes, sheets and blankets)?: Unable Difficulty moving from lying on back to sitting on the side of the bed? : Unable Difficulty sitting down on and standing up from a chair with arms (e.g., wheelchair, bedside commode, etc,.)?: Unable Help  needed moving to and from a bed to chair (including a wheelchair)?: Total Help needed walking in hospital room?: Total Help needed climbing 3-5 steps with a railing? : Total 6 Click Score: 6    End of Session   Activity Tolerance: Patient limited by fatigue;Other (comment)(weakness; poor movement) Patient left: in bed;with call bell/phone within reach;with bed alarm set   PT Visit Diagnosis: Unsteadiness on feet (R26.81);Other abnormalities of gait and mobility (R26.89);Muscle weakness (generalized) (M62.81);History of falling (Z91.81);Pain Pain - Right/Left: Left Pain - part of body: Leg     Time: 1324-4010 PT Time Calculation (min) (ACUTE ONLY): 21 min  Charges:  $Therapeutic Exercise: 8-22 mins                    G Codes:         Scot Dock, PTA 12/18/2017, 11:13 AM

## 2017-12-18 NOTE — Progress Notes (Signed)
Lasix drip infusing at 10 ml/hr as per order, patient voiding without difficulty,.  Performed  W/C as per order , patient denies any pain at this time , no bowel movement this shift , will continue to monitor .

## 2017-12-18 NOTE — Progress Notes (Signed)
[facility] [facility] [place] [facility]   PATIENT NAME: Antonio York    MR#:  [id number]  DATE OF BIRTH:  05/06/1961  SUBJECTIVE:  She is still constipated.  Feels like he has to urinate.  Denies shortness of breath or chest pain. REVIEW OF SYSTEMS:    Review of Systems  Constitutional: Negative for fever, chills weight loss HENT: Negative for ear pain, nosebleeds, congestion, facial swelling, rhinorrhea, neck pain, neck stiffness and ear discharge.   Respiratory: Negative for cough, shortness of breath, wheezing  Cardiovascular: Negative for chest pain, palpitations and positive for leg swelling.  Gastrointestinal: Negative for heartburn, abdominal pain, vomiting, diarrhea or ++consitpation Genitourinary: Negative for dysuria, urgency, frequency, hematuria ++Urinary retention Musculoskeletal: Negative for back pain or joint pain Neurological: Negative for dizziness, seizures, syncope, focal weakness,  numbness and headaches.  Hematological: Does bruise/bleed easily.  Psychiatric/Behavioral: Negative for hallucinations, confusion, dysphoric mood    Tolerating Diet: yes      DRUG ALLERGIES:  No Known Allergies  VITALS:  Blood pressure (!) 88/30, pulse (!) 102, temperature 98.9 F (37.2 C), temperature source Oral, resp. rate 19, height  (1.676 m), weight (!) 161.8 kg (356 lb 11.2 oz), SpO2 98 %.  PHYSICAL EXAMINATION:  Constitutional: Appears overweight. No distress. HENT: Normocephalic. Marland Kitchen Oropharynx is clear and moist.  Eyes: Conjunctivae and EOM are normal. PERRLA, no scleral icterus.  Neck: Normal ROM. Neck supple. No JVD. No tracheal deviation. CVS: RRR, S1/S2 +, no murmurs, no gallops, no carotid bruit.  Pulmonary: Decreased breath sounds throughout.  Abdominal: Soft. BS +,  no distension, tenderness, rebound or guarding.  Musculoskeletal: Normal range of motion. 4+ LEE and tenderness.  Scrotal edema Neuro: Alert. CN 2-12 grossly intact. No focal  deficits. Skin: Skin is warm and dry. No rash noted. Psychiatric: Normal mood and affect.      LABORATORY PANEL:   CBC Recent Labs  Lab 12/18/17 0346  WBC 7.7  HGB 7.7*  HCT 21.5*  PLT 96*   ------------------------------------------------------------------------------------------------------------------  Chemistries  Recent Labs  Lab 12/17/17 0323  12/18/17 0346  NA 124*   < > 125*  K 4.1  --  3.9  CL 96*  --  95*  CO2 22  --  22  GLUCOSE 102*  --  99  BUN 23*  --  25*  CREATININE 1.11  --  1.19  CALCIUM 8.4*  --  8.7*  AST 70*  --   --   ALT 40  --   --   ALKPHOS 109  --   --   BILITOT 5.8*  --   --    < > = values in this interval not displayed.   ------------------------------------------------------------------------------------------------------------------  Cardiac Enzymes Recent Labs  Lab 12/14/17 0922  TROPONINI <0.03   ------------------------------------------------------------------------------------------------------------------  RADIOLOGY:  No results found.   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  1.  Acute encephalopathy due to hyponatremia and hepatic encephalopathy: Patient appears to be at baseline.   Continue Xifaxan and lactulose.  2.  Hyponatremia with anasarca: This is due to hypervolemia from liver cirrhosis Start IV Lasix drip Foley catheter Continue to monitor BMP  3.  Acute on chronic blood loss anemia: Patient is status post 1 unit PRBC Continue PPI GI consultation requested  4.  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  History of hemochromatosis: Patient has not required phlebotomy recently  6.  Left shin laceration after fall which was sutured by ED physician upon admission Sutures to be removed on  May 25  Management plans discussed with the patient and he is in agreement.  Palliative care consultation for goals of care appreciated Discussed with nephrology and nurse.   CODE STATUS: DNR TOTAL TIME TAKING CARE OF THIS PATIENT: 28 minutes.     POSSIBLE D/C 2-4 days, DEPENDING ON CLINICAL CONDITION.   Antonio York M.D on 12/18/2017 at 10:59 AM  Between 7am to 6pm - Pager - [phone] After 6pm go to [email or web address] - password [email or web address]  [facility]  Office  [phone]  CC: Primary care physician; Antonio Casey, MD  Note: This dictation was prepared with Dragon dictation along with smaller phrase technology. Any transcriptional errors that result from this process are unintentional.

## 2017-12-18 NOTE — Care Management Important Message (Signed)
Important Message  Patient Details  Name: Antonio York MRN: [id number] Date of Birth: 1961/07/06   Medicare Important Message Given:  Yes    Olegario Messier A Hatcher Froning 12/18/2017, 10:34 AM

## 2017-12-19 DIAGNOSIS — K7460 Unspecified cirrhosis of liver: Secondary | ICD-10-CM

## 2017-12-19 DIAGNOSIS — D649 Anemia, unspecified: Secondary | ICD-10-CM

## 2017-12-19 LAB — CBC
HCT: 21.1 % — ABNORMAL LOW (ref 40.0–52.0)
Hemoglobin: 7.4 g/dL — ABNORMAL LOW (ref 13.0–18.0)
MCH: 36.1 pg — ABNORMAL HIGH (ref 26.0–34.0)
MCHC: 35.1 g/dL (ref 32.0–36.0)
MCV: 103.1 fL — ABNORMAL HIGH (ref 80.0–100.0)
PLATELETS: 96 10*3/uL — AB (ref 150–440)
RBC: 2.04 MIL/uL — ABNORMAL LOW (ref 4.40–5.90)
RDW: 16.6 % — AB (ref 11.5–14.5)
WBC: 6.6 10*3/uL (ref 3.8–10.6)

## 2017-12-19 LAB — CULTURE, BLOOD (ROUTINE X 2)
CULTURE: NO GROWTH
Culture: NO GROWTH
Special Requests: ADEQUATE

## 2017-12-19 LAB — COMPREHENSIVE METABOLIC PANEL
ALBUMIN: 3.2 g/dL — AB (ref 3.5–5.0)
ALK PHOS: 103 U/L (ref 38–126)
ALT: 32 U/L (ref 17–63)
AST: 52 U/L — AB (ref 15–41)
Anion gap: 11 (ref 5–15)
BILIRUBIN TOTAL: 7.6 mg/dL — AB (ref 0.3–1.2)
BUN: 22 mg/dL — ABNORMAL HIGH (ref 6–20)
CALCIUM: 8.4 mg/dL — AB (ref 8.9–10.3)
CO2: 21 mmol/L — ABNORMAL LOW (ref 22–32)
Chloride: 96 mmol/L — ABNORMAL LOW (ref 101–111)
Creatinine, Ser: 1.1 mg/dL (ref 0.61–1.24)
GFR calc Af Amer: >60 mL/min (ref >60)
GLUCOSE: 113 mg/dL — AB (ref 65–99)
Potassium: 3.7 mmol/L (ref 3.5–5.1)
Sodium: 128 mmol/L — ABNORMAL LOW (ref 135–145)
TOTAL PROTEIN: 5.8 g/dL — AB (ref 6.5–8.1)

## 2017-12-19 LAB — SODIUM: Sodium: 126 mmol/L — ABNORMAL LOW (ref 135–145)

## 2017-12-19 NOTE — H&P (View-Only) (Signed)
Antonio Darby, MD [address]  [place], [place] [address]  Main: [phone]  Fax: [phone] Pager: [phone]   Consultation  Referring Provider:     No ref. provider found Primary Care Physician:  Antonio Schultz, MD Primary Gastroenterologist:  Antonio York at [facility] hepatology          Reason for Consultation:    Worsening anemia  Date of Admission:  12/14/2017 Date of Consultation:  12/19/2017         HPI:   Antonio York is a 57 y.o. male With morbid obesity, metabolic syndrome, cerebral palsy with mild mental retardation, diastolic heart failure, decompensated cirrhosis due to Antonio York and hereditary hemachromatosis admitted on 12/14/2017 secondary to unwitnessed fall and confusion. He is being managed for severe volume overload, hyponatremia and encephalopathy. He is currently on Lasix drip and lactulose, rifaximin. His hemoglobin since admission has been slowly down trending although patient denies melena, hematochezia, hematemesis. Her hemoglobin is 6.7 on 12/17/2017, dropped to from 8.1 since admission. His baseline hemoglobin is between 7 and 8. He has macrocytosis secondary to cirrhosis.  NSAIDs: None  Antiplts/Anticoagulants/Anti thrombotics:None  GI procedures : EGD 07/24/2016 at [facility] - Normal esophagus. No varices.  - Portal hypertensive gastropathy.  - A single gastric polyp. Biopsied.  - Duodenal erosion without bleeding.  He did not have a colonoscopy before  Past Medical History:  Diagnosis Date  . Asthma   . Cerebral palsy ([facility])   . CHF (congestive heart failure) ([place])   . COPD (chronic obstructive pulmonary disease) ([place])   . Liver cirrhosis ([place])     History reviewed. No pertinent surgical history.  Prior to Admission medications   Medication Sig Start Date End Date Taking? Authorizing Provider  Diphenhyd-Hydrocort-Nystatin (FIRST-DUKES MOUTHWASH) SUSP Swish and swallow 5  mLs 3 (three) times daily before meals.   Yes [provider]  hydrocortisone cream 1 % Apply 1 application topically every 6 (six) hours as needed for itching. Apply to abdomen.   Yes [provider]  polyethylene glycol (MIRALAX / GLYCOLAX) packet Take 17 g by mouth daily.   Yes [provider]  promethazine (PHENERGAN) 12.5 MG tablet Take 12.5 mg by mouth every 6 (six) hours as needed for nausea/vomiting.   Yes [provider]  spironolactone (ALDACTONE) 100 MG tablet Take 300 mg by mouth daily. 11/08/17  Yes [provider]  torsemide (DEMADEX) 20 MG tablet Take 100 mg by mouth daily. 11/08/17  Yes [provider]  XIFAXAN 550 MG TABS tablet Take 550 mg by mouth 2 (two) times daily. 10/16/17  Yes [provider]    History reviewed. No pertinent family history.   Social History   Tobacco Use  . Smoking status: Never Smoker  . Smokeless tobacco: Never Used  Substance Use Topics  . Alcohol use: Not Currently  . Drug use: Not Currently    Allergies as of 12/14/2017  . (No Known Allergies)    Review of Systems:    All systems reviewed and negative except where noted in HPI.   Physical Exam:  Vital signs in last 24 hours: Temp:  [98.2 F (36.8 C)-99.6 F (37.6 C)] 98.7 F (37.1 C) (05/16 1923) Pulse Rate:  [92-103] 98 (05/16 1923) Resp:  [20-22] 22 (05/16 1923) BP: (98-120)/(34-49) 120/47 (05/16 1923) SpO2:  [98 %-100 %] 100 % (05/16 1923) Weight:  [343 lb 1.6 oz (155.6 kg)] 343 lb 1.6 oz (155.6 kg) (05/16 0419)  Last BM Date: 12/13/17 General:   Pleasant, cooperative in NAD, morbidly obese, appears yellow Head:  Normocephalic and atraumatic. Eyes:  + icterus.   Conjunctiva pink. PERRLA. Ears:  Normal auditory acuity. Neck:  Supple; no masses or thyroidomegaly Lungs: Respirations even and unlabored. Lungs clear to auscultation bilaterally.   No wheezes, crackles, or rhonchi.  Heart:  Regular rate and rhythm;   Without murmur, clicks, rubs or gallops Abdomen:  Soft, significantly obese, nontender. Normal bowel sounds. Limited abdominal exam, No rebound or guarding.  Rectal:  Not performed. Msk:  Symmetrical without gross deformities.   Extremities: 3+ edema, no cyanosis or clubbing. Neurologic:  Alert and oriented x3;  grossly normal neurologically. Skin: Yellow secondary to hyperbilirubinemia, Intact without significant lesions or rashes. Psych:  Alert and cooperative. Normal affect.  LAB RESULTS: CBC Latest Ref Rng & Units 12/19/2017 12/18/2017 12/17/2017  WBC 3.8 - 10.6 K/uL 6.6 7.7 -  Hemoglobin 13.0 - 18.0 g/dL 7.4(L) 7.7(L) 7.6(L)  Hematocrit 40.0 - 52.0 % 21.1(L) 21.5(L) 21.6(L)  Platelets 150 - 440 K/uL 96(L) 96(L) -    BMET BMP Latest Ref Rng & Units 12/19/2017 12/18/2017 12/18/2017  Glucose 65 - 99 mg/dL 113(H) - -  BUN 6 - 20 mg/dL 22(H) - -  Creatinine 0.61 - 1.24 mg/dL 1.10 - -  Sodium 135 - 145 mmol/L 128(L) 126(L) 125(L)  Potassium 3.5 - 5.1 mmol/L 3.7 - -  Chloride 101 - 111 mmol/L 96(L) - -  CO2 22 - 32 mmol/L 21(L) - -  Calcium 8.9 - 10.3 mg/dL 8.4(L) - -    LFT Hepatic Function Latest Ref Rng & Units 12/19/2017 12/17/2017 12/14/2017  Total Protein 6.5 - 8.1 g/dL 5.8(L) 5.9(L) 5.6(L)  Albumin 3.5 - 5.0 g/dL 3.2(L) 3.3(L) 2.2(L)  AST 15 - 41 U/L 52(H) 70(H) 84(H)  ALT 17 - 63 U/L 32 40 47  Alk Phosphatase 38 - 126 U/L 103 109 130(H)  Total Bilirubin 0.3 - 1.2 mg/dL 7.6(H) 5.8(H) 5.8(H)     STUDIES: No results found.    Impression / Plan:   Antonio York is a 57 y.o. Caucasian male with cerebral palsy, metabolic syndrome, diastolic heart failure, decompensated cirrhosis secondary to head with a hemachromatosis-in etiology, admitted with significant volume overload, altered mental status in the setting of hyponatremia and hepatic encephalopathy. GI is consulted for acute on chronic anemia. There is no evidence of active GI bleed  Worsening anemia: - Recommend EGD for  variceal surveillance and evaluate for peptic ulcer disease, last EGD in 2017 with no evidence of varices. - Nothing by mouth past midnight  Volume overload: - Currently on Lasix drip by nephrology - monitor urine output, renal function and electrolytes closely  Hepatic encephalopathy - Continue lactulose and rifaximin   Thank you for involving me in the care of this patient.  Dr. Alice York to cover from tomorrow    LOS: 5 days   Sherri Sear, MD  12/19/2017, 8:12 PM   Note: This dictation was prepared with Dragon dictation along with smaller phrase technology. Any transcriptional errors that result from this process are unintentional.

## 2017-12-19 NOTE — Progress Notes (Signed)
[facility] [place] [facility]   PATIENT NAME: Antonio York    MR#:  [id number]  DATE OF BIRTH:  05/27/1961  SUBJECTIVE:   Patient feels better.  Diuresing well with Lasix drip. REVIEW OF SYSTEMS:    Review of Systems  Constitutional: Negative for fever, chills weight loss HENT: Negative for ear pain, nosebleeds, congestion, facial swelling, rhinorrhea, neck pain, neck stiffness and ear discharge.   Respiratory: Negative for cough, shortness of breath, wheezing  Cardiovascular: Negative for chest pain, palpitations and positive for leg swelling.  Gastrointestinal: Negative for heartburn, abdominal pain, vomiting, diarrhea or ++consitpation Genitourinary: Negative for dysuria, urgency, frequency, hematuria ++Urinary retention Musculoskeletal: Negative for back pain or joint pain Neurological: Negative for dizziness, seizures, syncope, focal weakness,  numbness and headaches.  Hematological: Does bruise/bleed easily.  Psychiatric/Behavioral: Negative for hallucinations, confusion, dysphoric mood    Tolerating Diet: yes      DRUG ALLERGIES:  No Known Allergies  VITALS:  Blood pressure (!) 114/49, pulse 100, temperature 98.2 F (36.8 C), temperature source Oral, resp. rate 20, height  (1.676 m), weight (!) 155.6 kg (343 lb 1.6 oz), SpO2 98 %.  PHYSICAL EXAMINATION:  Constitutional: Appears overweight. No distress. HENT: Normocephalic. Marland Kitchen Oropharynx is clear and moist.  Eyes: Conjunctivae and EOM are normal. PERRLA, no scleral icterus.  Neck: Normal ROM. Neck supple. No JVD. No tracheal deviation. CVS: RRR, S1/S2 +, no murmurs, no gallops, no carotid bruit.  Pulmonary: Decreased breath sounds throughout.  Abdominal: Soft. BS +, positive abdominal distension, no tenderness, rebound or guarding.  Musculoskeletal: Normal range of motion. 4+ LEE and no tenderness.  Scrotal edema Neuro: Alert. CN 2-12 grossly intact. No focal deficits. Skin: Skin is  warm and dry. No rash noted. Psychiatric: Normal mood and affect.      LABORATORY PANEL:   CBC Recent Labs  Lab 12/19/17 0455  WBC 6.6  HGB 7.4*  HCT 21.1*  PLT 96*   ------------------------------------------------------------------------------------------------------------------  Chemistries  Recent Labs  Lab 12/19/17 0455  NA 128*  K 3.7  CL 96*  CO2 21*  GLUCOSE 113*  BUN 22*  CREATININE 1.10  CALCIUM 8.4*  AST 52*  ALT 32  ALKPHOS 103  BILITOT 7.6*   ------------------------------------------------------------------------------------------------------------------  Cardiac Enzymes Recent Labs  Lab 12/14/17 0922  TROPONINI <0.03   ------------------------------------------------------------------------------------------------------------------  RADIOLOGY:  No results found.   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  1.  Acute encephalopathy due to hyponatremia and hepatic encephalopathy: Patient is at baseline.   Continue Xifaxan and lactulose.  2.  Hyponatremia with anasarca: This is due to hypervolemia from liver cirrhosis Continue IV Lasix drip Sodium level improving  3.  Acute on chronic blood loss anemia: Patient is status post 1 unit PRBC Continue PPI GI consultation requested  4.  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  History of hemochromatosis: Patient has not required phlebotomy recently  6.  Left shin laceration after fall which was sutured by ED physician upon admission Sutures to be removed on May 25  7.  Constipation: Patient is on aggressive laxatives  Management plans discussed with the patient and he is in agreement.  Palliative care consultation for goals of care appreciated Discussed with nephrology and  nurse.   CODE STATUS: DNR TOTAL TIME TAKING CARE OF  THIS PATIENT: 25 minutes.     POSSIBLE D/C 3-4 days, DEPENDING ON CLINICAL CONDITION.   Maxima Skelton M.D on 12/19/2017 at 8:38 AM  Between 7am to 6pm - Pager - [phone] After 6pm go to [email or web address] - password [email or web address]  [facility]  Office  [phone]  CC: Primary care physician; Dimple Casey, MD  Note: This dictation was prepared with Dragon dictation along with smaller phrase technology. Any transcriptional errors that result from this process are unintentional.

## 2017-12-19 NOTE — Consult Note (Signed)
   Mayari Matus R Cloyde Oregel, MD [address]  [address]  [place], [place] [address]  Main: [phone]  Fax: [phone] Pager: [phone]   Consultation  Referring Provider:     No ref. provider found Primary Care Physician:  Smith, Sean A, MD Primary Gastroenterologist:  Dr. Jama Darling at [facility]          Reason for Consultation:    Worsening anemia  Date of Admission:  12/14/2017 Date of Consultation:  12/19/2017         HPI:   Antonio York is a 57 y.o. male With morbid obesity, metabolic syndrome, cerebral palsy with mild mental retardation, diastolic heart failure, decompensated cirrhosis due to Nash and hereditary hemachromatosis admitted on 12/14/2017 secondary to unwitnessed fall and confusion. He is being managed for severe volume overload, hyponatremia and encephalopathy. He is currently on Lasix drip and lactulose, rifaximin. His hemoglobin since admission has been slowly down trending although patient denies melena, hematochezia, hematemesis. Her hemoglobin is 6.7 on 12/17/2017, dropped to from 8.1 since admission. His baseline hemoglobin is between 7 and 8. He has macrocytosis secondary to cirrhosis.  NSAIDs: None  Antiplts/Anticoagulants/Anti thrombotics:None  GI procedures : EGD 07/24/2016 at [facility] - Normal esophagus. No varices.  - Portal hypertensive gastropathy.  - A single gastric polyp. Biopsied.  - Duodenal erosion without bleeding.  He did not have a colonoscopy before  Past Medical History:  Diagnosis Date  . Asthma   . Cerebral palsy (HCC)   . CHF (congestive heart failure) (HCC)   . COPD (chronic obstructive pulmonary disease) (HCC)   . Liver cirrhosis (HCC)     History reviewed. No pertinent surgical history.  Prior to Admission medications   Medication Sig Start Date End Date Taking? Authorizing Provider  Diphenhyd-Hydrocort-Nystatin (FIRST-DUKES MOUTHWASH) SUSP Swish and swallow 5  mLs 3 (three) times daily before meals.   Yes [provider]  hydrocortisone cream 1 % Apply 1 application topically every 6 (six) hours as needed for itching. Apply to abdomen.   Yes [provider]  polyethylene glycol (MIRALAX / GLYCOLAX) packet Take 17 g by mouth daily.   Yes [provider]  promethazine (PHENERGAN) 12.5 MG tablet Take 12.5 mg by mouth every 6 (six) hours as needed for nausea/vomiting.   Yes [provider]  spironolactone (ALDACTONE) 100 MG tablet Take 300 mg by mouth daily. 11/08/17  Yes [provider]  torsemide (DEMADEX) 20 MG tablet Take 100 mg by mouth daily. 11/08/17  Yes [provider]  XIFAXAN 550 MG TABS tablet Take 550 mg by mouth 2 (two) times daily. 10/16/17  Yes [provider]    History reviewed. No pertinent family history.   Social History   Tobacco Use  . Smoking status: Never Smoker  . Smokeless tobacco: Never Used  Substance Use Topics  . Alcohol use: Not Currently  . Drug use: Not Currently    Allergies as of 12/14/2017  . (No Known Allergies)    Review of Systems:    All systems reviewed and negative except where noted in HPI.   Physical Exam:  Vital signs in last 24 hours: Temp:  [98.2 F (36.8 C)-99.6 F (37.6 C)] 98.7 F (37.1 C) (05/16 1923) Pulse Rate:  [92-103] 98 (05/16 1923) Resp:  [20-22] 22 (05/16 1923) BP: (98-120)/(34-49) 120/47 (05/16 1923) SpO2:  [98 %-100 %] 100 % (05/16 1923) Weight:  [343 lb 1.6 oz (155.6 kg)] 343 lb 1.6 oz (155.6 kg) (05/16 0419)   Last BM Date: 12/13/17 General:   Pleasant, cooperative in NAD, morbidly obese, appears yellow Head:  Normocephalic and atraumatic. Eyes:  + icterus.   Conjunctiva pink. PERRLA. Ears:  Normal auditory acuity. Neck:  Supple; no masses or thyroidomegaly Lungs: Respirations even and unlabored. Lungs clear to auscultation bilaterally.   No wheezes, crackles, or rhonchi.  Heart:  Regular rate and rhythm;   Without murmur, clicks, rubs or gallops Abdomen:  Soft, significantly obese, nontender. Normal bowel sounds. Limited abdominal exam, No rebound or guarding.  Rectal:  Not performed. Msk:  Symmetrical without gross deformities.   Extremities: 3+ edema, no cyanosis or clubbing. Neurologic:  Alert and oriented x3;  grossly normal neurologically. Skin: Yellow secondary to hyperbilirubinemia, Intact without significant lesions or rashes. Psych:  Alert and cooperative. Normal affect.  LAB RESULTS: CBC Latest Ref Rng & Units 12/19/2017 12/18/2017 12/17/2017  WBC 3.8 - 10.6 K/uL 6.6 7.7 -  Hemoglobin 13.0 - 18.0 g/dL 7.4(L) 7.7(L) 7.6(L)  Hematocrit 40.0 - 52.0 % 21.1(L) 21.5(L) 21.6(L)  Platelets 150 - 440 K/uL 96(L) 96(L) -    BMET BMP Latest Ref Rng & Units 12/19/2017 12/18/2017 12/18/2017  Glucose 65 - 99 mg/dL 113(H) - -  BUN 6 - 20 mg/dL 22(H) - -  Creatinine 0.61 - 1.24 mg/dL 1.10 - -  Sodium 135 - 145 mmol/L 128(L) 126(L) 125(L)  Potassium 3.5 - 5.1 mmol/L 3.7 - -  Chloride 101 - 111 mmol/L 96(L) - -  CO2 22 - 32 mmol/L 21(L) - -  Calcium 8.9 - 10.3 mg/dL 8.4(L) - -    LFT Hepatic Function Latest Ref Rng & Units 12/19/2017 12/17/2017 12/14/2017  Total Protein 6.5 - 8.1 g/dL 5.8(L) 5.9(L) 5.6(L)  Albumin 3.5 - 5.0 g/dL 3.2(L) 3.3(L) 2.2(L)  AST 15 - 41 U/L 52(H) 70(H) 84(H)  ALT 17 - 63 U/L 32 40 47  Alk Phosphatase 38 - 126 U/L 103 109 130(H)  Total Bilirubin 0.3 - 1.2 mg/dL 7.6(H) 5.8(H) 5.8(H)     STUDIES: No results found.    Impression / Plan:   Antonio York is a 57 y.o. Caucasian male with cerebral palsy, metabolic syndrome, diastolic heart failure, decompensated cirrhosis secondary to head with a hemachromatosis-in etiology, admitted with significant volume overload, altered mental status in the setting of hyponatremia and hepatic encephalopathy. GI is consulted for acute on chronic anemia. There is no evidence of active GI bleed  Worsening anemia: - Recommend EGD for  variceal surveillance and evaluate for peptic ulcer disease, last EGD in 2017 with no evidence of varices. - Nothing by mouth past midnight  Volume overload: - Currently on Lasix drip by nephrology - monitor urine output, renal function and electrolytes closely  Hepatic encephalopathy - Continue lactulose and rifaximin   Thank you for involving me in the care of this patient.  Dr. Toledo to cover from tomorrow    LOS: 5 days   Antonio Burgueno, MD  12/19/2017, 8:12 PM   Note: This dictation was prepared with Dragon dictation along with smaller phrase technology. Any transcriptional errors that result from this process are unintentional. 

## 2017-12-19 NOTE — Progress Notes (Signed)
[facility]  ROUNDING NOTE   Subjective:   Na 128  Furosemide gtt  Patient is feeling better  Objective:  Vital signs in last 24 hours:  Temp:  [98.2 F (36.8 C)-99.6 F (37.6 C)] 98.2 F (36.8 C) (05/16 0432) Pulse Rate:  [98-104] 100 (05/16 0432) Resp:  [20-22] 20 (05/16 0432) BP: (98-119)/(34-49) 114/49 (05/16 0432) SpO2:  [95 %-98 %] 98 % (05/16 0432) Weight:  [155.6 kg (343 lb 1.6 oz)] 155.6 kg (343 lb 1.6 oz) (05/16 0419)  Weight change: -6.169 kg (-13 lb 9.6 oz) Filed Weights   12/17/17 0325 12/18/17 0351 12/19/17 0419  Weight: (!) 161.5 kg (356 lb) (!) 161.8 kg (356 lb 11.2 oz) (!) 155.6 kg (343 lb 1.6 oz)    Intake/Output: I/O last 3 completed shifts: In: 145.2 [I.V.:145.2] Out: 1750 [Urine:1750]   Intake/Output this shift:  Total I/O In: 240 [P.O.:240] Out: -   Physical Exam: General: NAD, laying in bed  Head: Normocephalic, atraumatic. Moist oral mucosal membranes  Eyes: Anicteric, PERRL  Neck: Supple, trachea midline  Lungs:  Clear to auscultation  Heart: Regular rate and rhythm  Abdomen:  Obese, +abdominal wall edema  Extremities: 1-2+ peripheral edema.  Neurologic: Nonfocal, moving all four extremities  Skin: No lesions       Basic Metabolic Panel: Recent Labs  Lab 12/15/17 0021 12/16/17 0423  12/17/17 0323  12/18/17 0346 12/18/17 1049 12/18/17 1706 12/18/17 2358 12/19/17 0455  NA 122* 123*   < > 124*   < > 125* 125* 125* 126* 128*  K 3.5 3.8  --  4.1  --  3.9  --   --   --  3.7  CL 94* 94*  --  96*  --  95*  --   --   --  96*  CO2 24 23  --  22  --  22  --   --   --  21*  GLUCOSE 141* 117*  --  102*  --  99  --   --   --  113*  BUN 20 23*  --  23*  --  25*  --   --   --  22*  CREATININE 1.11 1.39*  --  1.11  --  1.19  --   --   --  1.10  CALCIUM 7.9* 8.5*  --  8.4*  --  8.7*  --   --   --  8.4*   < > = values in this interval not displayed.    Liver Function Tests: Recent Labs  Lab 12/14/17 0922 12/17/17 0323  12/19/17 0455  AST 84* 70* 52*  ALT 47 40 32  ALKPHOS 130* 109 103  BILITOT 5.8* 5.8* 7.6*  PROT 5.6* 5.9* 5.8*  ALBUMIN 2.2* 3.3* 3.2*   No results for input(s): LIPASE, AMYLASE in the last 168 hours. Recent Labs  Lab 12/14/17 0923 12/16/17 1314 12/18/17 1125  AMMONIA 35 71* 79*    CBC: Recent Labs  Lab 12/14/17 0922 12/15/17 0021 12/17/17 0323 12/17/17 1528 12/18/17 0346 12/19/17 0455  WBC 5.8 8.2 6.5  --  7.7 6.6  HGB 8.1* 7.8* 6.7* 7.6* 7.7* 7.4*  HCT 23.1* 21.9* 19.4* 21.6* 21.5* 21.1*  MCV 102.9* 103.5* 104.2*  --  102.6* 103.1*  PLT 103* 111* 92*  --  96* 96*    Cardiac Enzymes: Recent Labs  Lab 12/14/17 0922  TROPONINI <0.03    BNP: Invalid input(s): POCBNP  CBG: No results for input(s):  GLUCAP in the last 168 hours.  Microbiology: Results for orders placed or performed during the hospital encounter of 12/14/17  Blood culture (routine x 2)     Status: None   Collection Time: 12/14/17  9:22 AM  Result Value Ref Range Status   Specimen Description BLOOD R PORT  Final   Special Requests   Final    BOTTLES DRAWN AEROBIC AND ANAEROBIC Blood Culture results may not be optimal due to an excessive volume of blood received in culture bottles   Culture   Final    NO GROWTH 5 DAYS Performed at [facility], [address], [place], [place] [address]    Report Status 12/19/2017 FINAL  Final  Blood culture (routine x 2)     Status: None   Collection Time: 12/14/17 12:58 PM  Result Value Ref Range Status   Specimen Description BLOOD RT [facility]  Final   Special Requests   Final    BOTTLES DRAWN AEROBIC AND ANAEROBIC Blood Culture adequate volume   Culture   Final    NO GROWTH 5 DAYS Performed at [facility], [address] [place], [place] [address]    Report Status 12/19/2017 FINAL  Final  MRSA PCR Screening     Status: None   Collection Time: 12/14/17  3:42 PM  Result Value Ref Range Status   MRSA by PCR NEGATIVE NEGATIVE Final     Comment:        The GeneXpert MRSA Assay (FDA approved for NASAL specimens only), is one component of a comprehensive MRSA colonization surveillance program. It is not intended to diagnose MRSA infection nor to guide or monitor treatment for MRSA infections. Performed at [facility], [address] [place], [place] [address]   Urine culture     Status: Abnormal   Collection Time: 12/15/17 12:25 AM  Result Value Ref Range Status   Specimen Description   Final    URINE, RANDOM Performed at [facility], [address], [place], [place] [address]    Special Requests   Final    NONE Performed at [facility], [address] [place], [place] [address]    Culture (A)  Final    <10,000 COLONIES/mL INSIGNIFICANT GROWTH Performed at [facility], [address], [place], [place] [address]    Report Status 12/16/2017 FINAL  Final    Coagulation Studies: No results for input(s): LABPROT, INR in the last 72 hours.  Urinalysis: No results for input(s): COLORURINE, LABSPEC, PHURINE, GLUCOSEU, HGBUR, BILIRUBINUR, KETONESUR, PROTEINUR, UROBILINOGEN, NITRITE, LEUKOCYTESUR in the last 72 hours.  Invalid input(s): APPERANCEUR    Imaging: No results found.   Medications:   . furosemide (LASIX) infusion 10 mg/hr (12/19/17 1205)   . bisacodyl  5 mg Oral Daily  . enoxaparin (LOVENOX) injection  40 mg Subcutaneous Q12H  . lactulose  30 g Oral QID  . midodrine  10 mg Oral TID WC  . pantoprazole  40 mg Oral Daily  . polyethylene glycol  17 g Oral QHS  . rifaximin  550 mg Oral BID  . senna-docusate  2 tablet Oral BID  . spironolactone  25 mg Oral Daily   acetaminophen **OR** acetaminophen, guaiFENesin, oxyCODONE, polyethylene glycol, promethazine  Assessment/ Plan:  [name] is a 57 y.o. 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   1.  Hyponatremia. 2.  Hypotension. 3.  Liver cirrhosis secondary to Nash/hemochromatosis. MELD score on 5/14 of 30 4.  Anemia unspecified.  Plan: Consistent with hypervolemic hypo-osmolar hyponatremia secondary to hepatic cirrhosis - Fluid restriction - Completed 4 days of IV albumin.  - Serial serum sodium checks - Continue spironolactone - Continue furosemide gtt at /hr and evaluation again tomorrow.  - Monitor blood pressure, Continue midodrine.    LOS: 5 Shahir Karen 5/16/20191:17 PM

## 2017-12-19 NOTE — Clinical Social Work Note (Signed)
CSW notified by RN CM that patient is interested in going to SNF but would like to go somewhere other than [facility]. CSW spoke with patient and he would like to go to a facility in [place]. CSW explained to patient that we can do a bed search but if no one else can accept then he will have to choose between going home and [facility]. Patient agreed with this. CSW will initiate bed search and give bed offers once received.   Ruthe Mannan MSW, [address] [phone]

## 2017-12-19 NOTE — Care Management Note (Signed)
Case Management Note  Patient Details  Name: Antonio York MRN: [id number] Date of Birth: 04/16/1961  Subjective/Objective: Admitted to [facility] from [facility] with the diagnosis of hyponatremia. Admitted to this facility 11/17/17. Lives with wife, Mindi Junker [phone]). Last seen Dr. Malachi Bonds in [facility] April 2019. Prescriptions are filled at [facility] in [place]. Home Health in February. Can't remember name of agency. States he has been in 6 different skilled nursing facilities in the past. Port placed x 4 years. No home oxygen. Medical recliner, bedside commode, and wheelchair in the home. Last fall was prior to going to [facility]. "Appetite is coming back."                    Action/Plan: Physical therapy evaluation completed. Recommending skilled nursing facility. Would like to return home, if possible.   Expected Discharge Date:                  Expected Discharge Plan:     In-House Referral:     Discharge planning Services     Post Acute Care Choice:    Choice offered to:     DME Arranged:    DME Agency:     HH Arranged:    HH Agency:     Status of Service:     If discussed at [employer], dates discussed:    Additional Comments:  Gwenette Greet, RN MSN CCM Care Management  [phone] 12/19/2017, 10:39 AM

## 2017-12-20 ENCOUNTER — Inpatient Hospital Stay: Payer: Medicare Other | Admitting: Anesthesiology

## 2017-12-20 ENCOUNTER — Encounter
Admission: EM | Disposition: A | Discharge status: Skilled Nursing Facility | Payer: Self-pay | Source: Home / Self Care | Attending: Internal Medicine

## 2017-12-20 ENCOUNTER — Encounter: Payer: Self-pay | Admitting: *Deleted

## 2017-12-20 HISTORY — PX: ESOPHAGOGASTRODUODENOSCOPY: SHX5428

## 2017-12-20 LAB — BASIC METABOLIC PANEL
ANION GAP: 8 (ref 5–15)
BUN: 19 mg/dL (ref 6–20)
CALCIUM: 8.2 mg/dL — AB (ref 8.9–10.3)
CO2: 24 mmol/L (ref 22–32)
CREATININE: 1.07 mg/dL (ref 0.61–1.24)
Chloride: 97 mmol/L — ABNORMAL LOW (ref 101–111)
Glucose, Bld: 129 mg/dL — ABNORMAL HIGH (ref 65–99)
Potassium: 3.5 mmol/L (ref 3.5–5.1)
Sodium: 129 mmol/L — ABNORMAL LOW (ref 135–145)

## 2017-12-20 SURGERY — EGD (ESOPHAGOGASTRODUODENOSCOPY)
Anesthesia: General

## 2017-12-20 MED ORDER — PROPOFOL 10 MG/ML IV BOLUS
INTRAVENOUS | Status: DC | PRN
  Administered 2017-12-20 (×2): 10 mg via INTRAVENOUS
  Administered 2017-12-20: 40 mg via INTRAVENOUS

## 2017-12-20 MED ORDER — PROPOFOL 500 MG/50ML IV EMUL
INTRAVENOUS | Status: DC | PRN
  Administered 2017-12-20: 150 ug/kg/min via INTRAVENOUS

## 2017-12-20 MED ORDER — SODIUM CHLORIDE 0.9 % IV SOLN
INTRAVENOUS | Status: DC | PRN
  Administered 2017-12-20: 14:00:00 via INTRAVENOUS

## 2017-12-20 MED ORDER — TORSEMIDE 20 MG PO TABS
40.0000 mg | ORAL_TABLET | Freq: Every day | ORAL | Status: DC
  Administered 2017-12-20 – 2017-12-21 (×2): 40 mg via ORAL
  Filled 2017-12-20 (×2): qty 2

## 2017-12-20 NOTE — Progress Notes (Signed)
PT Cancellation Note  Patient Details Name: Antonio York MRN: [id number] DOB: 05/05/1961   Cancelled Treatment:    Reason Eval/Treat Not Completed: Patient at procedure or test/unavailable Pt out of room for procedure, will try back later as time allows.   Malachi Pro, DPT 12/20/2017, 4:35 PM

## 2017-12-20 NOTE — Anesthesia Procedure Notes (Signed)
Date/Time: 12/20/2017 2:03 PM Performed by: Junious Silk, CRNA Pre-anesthesia Checklist: Patient identified, Emergency Drugs available, Suction available, Patient being monitored and Timeout performed Oxygen Delivery Method: Nasal cannula

## 2017-12-20 NOTE — Progress Notes (Signed)
[facility]  ROUNDING NOTE   Subjective:   Na 129  Objective:  Vital signs in last 24 hours:  Temp:  [98.7 F (37.1 C)-99.3 F (37.4 C)] 99.3 F (37.4 C) (05/17 0425) Pulse Rate:  [92-103] 103 (05/17 0425) Resp:  [20-22] 20 (05/17 0425) BP: (100-120)/(42-47) 100/46 (05/17 0425) SpO2:  [96 %-100 %] 96 % (05/17 0425) Weight:  [155.6 kg (343 lb)] 155.6 kg (343 lb) (05/17 0425)  Weight change: -0.045 kg (-1.6 oz) Filed Weights   12/18/17 0351 12/19/17 0419 12/20/17 0425  Weight: (!) 161.8 kg (356 lb 11.2 oz) (!) 155.6 kg (343 lb 1.6 oz) (!) 155.6 kg (343 lb)    Intake/Output: I/O last 3 completed shifts: In: 745.2 [P.O.:600; I.V.:145.2] Out: 2800 [Urine:2800]   Intake/Output this shift:  Total I/O In: 298.2 [I.V.:298.2] Out: 1200 [Urine:1200]  Physical Exam: General: NAD, laying in bed  Head: Normocephalic, atraumatic. Moist oral mucosal membranes  Eyes: Anicteric, PERRL  Neck: Supple, trachea midline  Lungs:  Clear to auscultation  Heart: Regular rate and rhythm  Abdomen:  Obese   Extremities: 1+ peripheral edema.  Neurologic: Nonfocal, moving all four extremities  Skin: No lesions       Basic Metabolic Panel: Recent Labs  Lab 12/16/17 0423  12/17/17 0323  12/18/17 0346 12/18/17 1049 12/18/17 1706 12/18/17 2358 12/19/17 0455 12/20/17 0509  NA 123*   < > 124*   < > 125* 125* 125* 126* 128* 129*  K 3.8  --  4.1  --  3.9  --   --   --  3.7 3.5  CL 94*  --  96*  --  95*  --   --   --  96* 97*  CO2 23  --  22  --  22  --   --   --  21* 24  GLUCOSE 117*  --  102*  --  99  --   --   --  113* 129*  BUN 23*  --  23*  --  25*  --   --   --  22* 19  CREATININE 1.39*  --  1.11  --  1.19  --   --   --  1.10 1.07  CALCIUM 8.5*  --  8.4*  --  8.7*  --   --   --  8.4* 8.2*   < > = values in this interval not displayed.    Liver Function Tests: Recent Labs  Lab 12/14/17 0922 12/17/17 0323 12/19/17 0455  AST 84* 70* 52*  ALT 47 40 32  ALKPHOS  130* 109 103  BILITOT 5.8* 5.8* 7.6*  PROT 5.6* 5.9* 5.8*  ALBUMIN 2.2* 3.3* 3.2*   No results for input(s): LIPASE, AMYLASE in the last 168 hours. Recent Labs  Lab 12/14/17 0923 12/16/17 1314 12/18/17 1125  AMMONIA 35 71* 79*    CBC: Recent Labs  Lab 12/14/17 0922 12/15/17 0021 12/17/17 0323 12/17/17 1528 12/18/17 0346 12/19/17 0455  WBC 5.8 8.2 6.5  --  7.7 6.6  HGB 8.1* 7.8* 6.7* 7.6* 7.7* 7.4*  HCT 23.1* 21.9* 19.4* 21.6* 21.5* 21.1*  MCV 102.9* 103.5* 104.2*  --  102.6* 103.1*  PLT 103* 111* 92*  --  96* 96*    Cardiac Enzymes: Recent Labs  Lab 12/14/17 0922  TROPONINI <0.03    BNP: Invalid input(s): POCBNP  CBG: No results for input(s): GLUCAP in the last 168 hours.  Microbiology: Results for orders placed or performed during  the hospital encounter of 12/14/17  Blood culture (routine x 2)     Status: None   Collection Time: 12/14/17  9:22 AM  Result Value Ref Range Status   Specimen Description BLOOD R PORT  Final   Special Requests   Final    BOTTLES DRAWN AEROBIC AND ANAEROBIC Blood Culture results may not be optimal due to an excessive volume of blood received in culture bottles   Culture   Final    NO GROWTH 5 DAYS Performed at [facility], [address], [place], [place] [address]    Report Status 12/19/2017 FINAL  Final  Blood culture (routine x 2)     Status: None   Collection Time: 12/14/17 12:58 PM  Result Value Ref Range Status   Specimen Description BLOOD RT [facility]  Final   Special Requests   Final    BOTTLES DRAWN AEROBIC AND ANAEROBIC Blood Culture adequate volume   Culture   Final    NO GROWTH 5 DAYS Performed at [facility], [address] [place], [place] [address]    Report Status 12/19/2017 FINAL  Final  MRSA PCR Screening     Status: None   Collection Time: 12/14/17  3:42 PM  Result Value Ref Range Status   MRSA by PCR NEGATIVE NEGATIVE Final    Comment:        The GeneXpert MRSA Assay (FDA approved  for NASAL specimens only), is one component of a comprehensive MRSA colonization surveillance program. It is not intended to diagnose MRSA infection nor to guide or monitor treatment for MRSA infections. Performed at [facility], [address] [place], [place] [address]   Urine culture     Status: Abnormal   Collection Time: 12/15/17 12:25 AM  Result Value Ref Range Status   Specimen Description   Final    URINE, RANDOM Performed at [facility], [address] [place], [place] [address]    Special Requests   Final    NONE Performed at [facility], [address] [place], [place] [address]    Culture (A)  Final    <10,000 COLONIES/mL INSIGNIFICANT GROWTH Performed at [facility], [address] [place], [place] [address]    Report Status 12/16/2017 FINAL  Final    Coagulation Studies: No results for input(s): LABPROT, INR in the last 72 hours.  Urinalysis: No results for input(s): COLORURINE, LABSPEC, PHURINE, GLUCOSEU, HGBUR, BILIRUBINUR, KETONESUR, PROTEINUR, UROBILINOGEN, NITRITE, LEUKOCYTESUR in the last 72 hours.  Invalid input(s): APPERANCEUR    Imaging: No results found.   Medications:    . [MAR Hold] bisacodyl  5 mg Oral Daily  . [MAR Hold] enoxaparin (LOVENOX) injection  40 mg Subcutaneous Q12H  . [MAR Hold] lactulose  30 g Oral QID  . [MAR Hold] midodrine  10 mg Oral TID WC  . [MAR Hold] pantoprazole  40 mg Oral Daily  . [MAR Hold] polyethylene glycol  17 g Oral QHS  . [MAR Hold] rifaximin  550 mg Oral BID  . [MAR Hold] senna-docusate  2 tablet Oral BID  . [MAR Hold] spironolactone  25 mg Oral Daily  . [MAR Hold] torsemide  40 mg Oral Daily   [MAR Hold] acetaminophen **OR** [MAR Hold] acetaminophen, [MAR Hold] guaiFENesin, [MAR Hold] oxyCODONE, [MAR Hold] polyethylene glycol, [MAR Hold] promethazine  Assessment/ Plan:  [name] is a 57 y.o. white male with cerebral palsy, diastolic heart failure,  liver cirrhosis secondary to nonalcoholic fatty  liver disease/hemochromatosis, lower extremity edema, generalized debility, who was admitted to [facility] on 12/14/2017 for evaluation of confusion.   1.  Hyponatremia. 2.  Hypotension. 3.  Liver cirrhosis secondary to Nash/hemochromatosis. MELD score on 5/14 of 30 4.  Anemia unspecified.  Plan: Consistent with hypervolemic hypo-osmolar hyponatremia secondary to hepatic cirrhosis - Fluid restriction - Completed 4 days of IV albumin.  - Continue torsemide.  - Continue spironolactone - Continue midodrine - Monitor blood pressure    LOS: 6 Narciso Stoutenburg 5/17/20191:42 PM

## 2017-12-20 NOTE — Anesthesia Post-op Follow-up Note (Signed)
Anesthesia QCDR form completed.        

## 2017-12-20 NOTE — Op Note (Signed)
[facility] Patient Name: Antonio York Procedure Date: 12/20/2017 2:09 PM MRN: [id number] Account #: [id number] Date of Birth: Apr 28, 1961 Admit Type: Outpatient Age: 57 Room: [facility] ENDO ROOM 2 Gender: Male Note Status: Finalized Procedure:            Upper GI endoscopy Indications:          Iron deficiency anemia due to suspected upper                        gastrointestinal bleeding Providers:            Boykin Nearing. [place] MD, MD Medicines:            Propofol per Anesthesia Complications:        No immediate complications. Procedure:            Pre-Anesthesia Assessment:                       - The risks and benefits of the procedure and the                        sedation options and risks were discussed with the                        patient. All questions were answered and informed                        consent was obtained.                       - Patient identification and proposed procedure were                        verified prior to the procedure by the nurse. The                        procedure was verified in the procedure room.                       After obtaining informed consent, the endoscope was                        passed under direct vision. Throughout the procedure,                        the patient's blood pressure, pulse, and oxygen                        saturations were monitored continuously. The Endoscope                        was introduced through the mouth, and advanced to the                        third part of duodenum. The upper GI endoscopy was                        accomplished without difficulty. The patient tolerated                        the procedure well. Findings:  Mild portal hypertensive gastropathy was found in the entire examined       stomach.      The examined esophagus was normal.      There is no endoscopic evidence of varices, erythema or Mallory-Weiss       tear in the entire  esophagus.      The examined duodenum was normal.      There is no endoscopic evidence of varices in the stomach. Impression:           - Portal hypertensive gastropathy.                       - Normal esophagus.                       - Normal examined duodenum.                       - No specimens collected. Recommendation:       - Return patient to hospital ward for ongoing care.                       - Resume previous diet. Procedure Code(s):    --- Professional ---                       [phone], Esophagogastroduodenoscopy, flexible, transoral;                        diagnostic, including collection of specimen(s) by                        brushing or washing, when performed (separate procedure) Diagnosis Code(s):    --- Professional ---                       D50.9, Iron deficiency anemia, unspecified                       K31.89, Other diseases of stomach and duodenum                       K76.6, Portal hypertension CPT copyright 2017 American Medical Association. All rights reserved. The codes documented in this report are preliminary and upon coder review may  be revised to meet current compliance requirements. Stanton Kidney MD, MD 12/20/2017 2:22:09 PM This report has been signed electronically. Number of Addenda: 0 Note Initiated On: 12/20/2017 2:09 PM      [facility]

## 2017-12-20 NOTE — Progress Notes (Signed)
[facility] [place] [facility]   PATIENT NAME: Antonio York    MR#:  [id number]  DATE OF BIRTH:  [birth year]  SUBJECTIVE:   Patient had small bowel movement yesterday.  Patient is feeling overall much improved since admission.  He is diuresing well with I the Lasix drip.  Plan for EGD today.  No melena noted. REVIEW OF SYSTEMS:    Review of Systems  Constitutional: Negative for fever, chills weight loss HENT: Negative for ear pain, nosebleeds, congestion, facial swelling, rhinorrhea, neck pain, neck stiffness and ear discharge.   Respiratory: Negative for cough, shortness of breath, wheezing  Cardiovascular: Negative for chest pain, palpitations and positive for leg swelling.  Gastrointestinal: Negative for heartburn, abdominal pain, vomiting, diarrhea or ++consitpation (improved) Genitourinary: Negative for dysuria, urgency, frequency, hematuria  Musculoskeletal: Negative for back pain or joint pain Neurological: Negative for dizziness, seizures, syncope, focal weakness,  numbness and headaches.  Hematological: Does bruise/bleed easily.  Psychiatric/Behavioral: Negative for hallucinations, confusion, dysphoric mood    Tolerating Diet: N.p.o.     DRUG ALLERGIES:  No Known Allergies  VITALS:  Blood pressure (!) 100/46, pulse (!) 103, temperature 99.3 F (37.4 C), temperature source Oral, resp. rate 20, height  (1.676 m), weight (!) 155.6 kg (343 lb), SpO2 96 %.  PHYSICAL EXAMINATION:  Constitutional: Appears overweight. No distress. HENT: Normocephalic. Marland Kitchen Oropharynx is clear and moist.  Eyes: Conjunctivae and EOM are normal. PERRLA, no scleral icterus.  Neck: Normal ROM. Neck supple. No JVD. No tracheal deviation. CVS: RRR, S1/S2 +, no murmurs, no gallops, no carotid bruit.  Pulmonary: Decreased breath sounds throughout.  Abdominal: Soft. BS +, positive abdominal distension, no tenderness, rebound or guarding.  Musculoskeletal: Normal range of  motion. 4+ LEE and no tenderness.  Scrotal edema Neuro: Alert. CN 2-12 grossly intact. No focal deficits. Skin: Skin is warm and dry. No rash noted. Psychiatric: Normal mood and affect.      LABORATORY PANEL:   CBC Recent Labs  Lab 12/19/17 0455  WBC 6.6  HGB 7.4*  HCT 21.1*  PLT 96*   ------------------------------------------------------------------------------------------------------------------  Chemistries  Recent Labs  Lab 12/19/17 0455 12/20/17 0509  NA 128* 129*  K 3.7 3.5  CL 96* 97*  CO2 21* 24  GLUCOSE 113* 129*  BUN 22* 19  CREATININE 1.10 1.07  CALCIUM 8.4* 8.2*  AST 52*  --   ALT 32  --   ALKPHOS 103  --   BILITOT 7.6*  --    ------------------------------------------------------------------------------------------------------------------  Cardiac Enzymes Recent Labs  Lab 12/14/17 0922  TROPONINI <0.03   ------------------------------------------------------------------------------------------------------------------  RADIOLOGY:  No results found.   ASSESSMENT AND PLAN:   57 year old male with history of cerebral palsy, chronic diastolic heart failure liver cirrhosis due to hemochromatosis and NAFLD who presented from [place] [facility] with confusion.  1.  Acute encephalopathy due to hyponatremia and hepatic encephalopathy:  Mental status has improved and he appears to be at baseline.  Continue Xifaxan and lactulose.  2.  Hyponatremia with anasarca: This is due to hypervolemia from liver cirrhosis Continue IV Lasix drip Sodium level has improved with IV Lasix drip.  3.  Acute on chronic blood loss anemia: Patient is status post 1 unit PRBC Plan for EGD to evaluate for underlying esophageal varices. Hemoglobin relatively stable and no indication for blood transfusion at this point. Continue PPI  4.  Liver cirrhosis due to NAFLD and hemochromatosis: Continue Xifaxan and lactulose Continue Aldactone and Lasix drip Patient admitted  drain due to hypotension  5.  History of hemochromatosis: Patient has not required phlebotomy recently  6.  Left shin laceration after fall which was sutured by ED physician upon admission Sutures to be removed on May 25  7.  Constipation: Patient is on aggressive laxatives had small bowel movement yesterday.  Management plans discussed with the patient and he is in agreement.  Palliative care consultation for goals of care appreciated Discussed with nephrology and nurse.   CODE STATUS: DNR TOTAL TIME TAKING CARE OF THIS PATIENT: 24 minutes.   Physical therapy is recommending skilled nursing facility  csw working on d/c plan  POSSIBLE D/C 2-3 days, DEPENDING ON CLINICAL CONDITION.   Darwin Guastella M.D on 12/20/2017 at 8:05 AM  Between 7am to 6pm - Pager - [phone] After 6pm go to [email or web address] - password [email or web address]  [facility]  Office  [phone]  CC: Primary care physician; Dimple Casey, MD  Note: This dictation was prepared with Dragon dictation along with smaller phrase technology. Any transcriptional errors that result from this process are unintentional.

## 2017-12-20 NOTE — Care Management Important Message (Signed)
Important Message  Patient Details  Name: Antonio York MRN: [id number] Date of Birth: 1961/02/28   Medicare Important Message Given:  Yes    Olegario Messier A Mazzie Brodrick 12/20/2017, 10:43 AM

## 2017-12-20 NOTE — Interval H&P Note (Signed)
History and Physical Interval Note:  12/20/2017 1:51 PM  Antonio York  has presented today for surgery, with the diagnosis of Worsening anemia, cirrhosis  The various methods of treatment have been discussed with the patient and family. After consideration of risks, benefits and other options for treatment, the patient has consented to  Procedure(s): ESOPHAGOGASTRODUODENOSCOPY (EGD) (N/A) as a surgical intervention .  The patient's history has been reviewed, patient examined, no change in status, stable for surgery.  I have reviewed the patient's chart and labs.  Questions were answered to the patient's satisfaction.     [place], [place]

## 2017-12-20 NOTE — Clinical Social Work Note (Signed)
CSW met with patient to discuss bed offers. CSW explained to patient that there are limited facilities in [place] and due to his size the facilities were unable to accommodate him. CSW asked if patient would be willing to go back to [facility] and patient states that he would really prefer not to go there. CSW asked patient if he felt that he could go home and have the appropriate support needed. Patient states that he does not think going home is the best option right now due to his medical condition and the amount of assistance he needs. CSW explained that patient's only option for SNF would be [facility]. Patient reluctantly agreed to this and states that he will go back to [facility] when ready for discharge. CSW notified Claiborne Billings, Admissions Coordinator at [facility]. Claiborne Billings will begin [facility] authorization for SNF.   Annamaria Boots MSW, [place] [phone]

## 2017-12-20 NOTE — Transfer of Care (Signed)
Immediate Anesthesia Transfer of Care Note  Patient: Antonio York  Procedure(s) Performed: ESOPHAGOGASTRODUODENOSCOPY (EGD) (N/A )  Patient Location: PACU  Anesthesia Type:General  Level of Consciousness: awake, alert  and oriented  Airway & Oxygen Therapy: Patient Spontanous Breathing and Patient connected to nasal cannula oxygen  Post-op Assessment: Report given to RN and Post -op Vital signs reviewed and stable  Post vital signs: Reviewed and stable  Last Vitals:  Vitals Value Taken Time  BP 87/52 12/20/2017  2:20 PM  Temp 36.1 C 12/20/2017  2:19 PM  Pulse 103 12/20/2017  2:22 PM  Resp 14 12/20/2017  2:22 PM  SpO2 100 % 12/20/2017  2:22 PM  Vitals shown include unvalidated device data.  Last Pain:  Vitals:   12/20/17 1419  TempSrc: Tympanic  PainSc: 0-No pain      Patients Stated Pain Goal: 0 (12/15/17 2000)  Complications: No apparent anesthesia complications

## 2017-12-20 NOTE — Anesthesia Preprocedure Evaluation (Signed)
Anesthesia Evaluation  Patient identified by MRN, date of birth, ID band Patient awake    Reviewed: Allergy & Precautions, H&P , NPO status , Patient's Chart, lab work & pertinent test results, reviewed documented beta blocker date and time   History of Anesthesia Complications Negative for: history of anesthetic complications  Airway Mallampati: III  TM Distance: >3 FB Neck ROM: full    Dental  (+) Dental Advidsory Given, Teeth Intact   Pulmonary neg shortness of breath, asthma , neg sleep apnea, COPD,  COPD inhaler, neg recent URI,           Cardiovascular Exercise Tolerance: Poor (-) hypertension(-) angina+CHF  (-) CAD, (-) Past MI, (-) Cardiac Stents and (-) CABG (-) dysrhythmias (-) Valvular Problems/Murmurs     Neuro/Psych negative neurological ROS  negative psych ROS   GI/Hepatic GERD  ,(+) Cirrhosis       ,   Endo/Other  neg diabetesMorbid obesity  Renal/GU negative Renal ROS  negative genitourinary   Musculoskeletal   Abdominal   Peds  Hematology negative hematology ROS (+)   Anesthesia Other Findings Past Medical History: No date: Asthma No date: Cerebral palsy (HCC) No date: CHF (congestive heart failure) (HCC) No date: COPD (chronic obstructive pulmonary disease) (HCC) No date: Liver cirrhosis (HCC)   Reproductive/Obstetrics negative OB ROS                             Anesthesia Physical Anesthesia Plan  ASA: III  Anesthesia Plan: General   Post-op Pain Management:    Induction: Intravenous  PONV Risk Score and Plan: 2 and Propofol infusion  Airway Management Planned: Natural Airway and Nasal Cannula  Additional Equipment:   Intra-op Plan:   Post-operative Plan:   Informed Consent: I have reviewed the patients History and Physical, chart, labs and discussed the procedure including the risks, benefits and alternatives for the proposed anesthesia with the  patient or authorized representative who has indicated his/her understanding and acceptance.   Dental Advisory Given  Plan Discussed with: Anesthesiologist, CRNA and Surgeon  Anesthesia Plan Comments:         Anesthesia Quick Evaluation

## 2017-12-21 LAB — COMPREHENSIVE METABOLIC PANEL
ALK PHOS: 111 U/L (ref 38–126)
ALT: 29 U/L (ref 17–63)
AST: 45 U/L — AB (ref 15–41)
Albumin: 3.1 g/dL — ABNORMAL LOW (ref 3.5–5.0)
Anion gap: 8 (ref 5–15)
BUN: 19 mg/dL (ref 6–20)
CALCIUM: 8.2 mg/dL — AB (ref 8.9–10.3)
CHLORIDE: 98 mmol/L — AB (ref 101–111)
CO2: 23 mmol/L (ref 22–32)
Creatinine, Ser: 0.88 mg/dL (ref 0.61–1.24)
Glucose, Bld: 120 mg/dL — ABNORMAL HIGH (ref 65–99)
Potassium: 3.3 mmol/L — ABNORMAL LOW (ref 3.5–5.1)
SODIUM: 129 mmol/L — AB (ref 135–145)
Total Bilirubin: 6.2 mg/dL — ABNORMAL HIGH (ref 0.3–1.2)
Total Protein: 5.9 g/dL — ABNORMAL LOW (ref 6.5–8.1)

## 2017-12-21 LAB — CBC
HCT: 22.1 % — ABNORMAL LOW (ref 40.0–52.0)
HEMOGLOBIN: 7.6 g/dL — AB (ref 13.0–18.0)
MCH: 35.8 pg — ABNORMAL HIGH (ref 26.0–34.0)
MCHC: 34.4 g/dL (ref 32.0–36.0)
MCV: 104 fL — ABNORMAL HIGH (ref 80.0–100.0)
Platelets: 101 10*3/uL — ABNORMAL LOW (ref 150–440)
RBC: 2.12 MIL/uL — ABNORMAL LOW (ref 4.40–5.90)
RDW: 17.1 % — ABNORMAL HIGH (ref 11.5–14.5)
WBC: 4.6 10*3/uL (ref 3.8–10.6)

## 2017-12-21 MED ORDER — LACTULOSE 10 GM/15ML PO SOLN: 30.0000 g | Freq: Three times a day (TID) | ORAL | 0 refills | Status: AC

## 2017-12-21 MED ORDER — PANTOPRAZOLE SODIUM 40 MG PO TBEC: 40.0000 mg | DELAYED_RELEASE_TABLET | Freq: Every day | ORAL | Status: DC

## 2017-12-21 MED ORDER — TORSEMIDE 20 MG PO TABS: 40.0000 mg | ORAL_TABLET | Freq: Every day | ORAL | Status: AC

## 2017-12-21 MED ORDER — HEPARIN SOD (PORK) LOCK FLUSH 100 UNIT/ML IV SOLN
500.0000 [IU] | Freq: Once | INTRAVENOUS | Status: AC
  Administered 2017-12-21: 500 [IU] via INTRAVENOUS
  Filled 2017-12-21: qty 5

## 2017-12-21 MED ORDER — SODIUM CHLORIDE 0.9% FLUSH
10.0000 mL | INTRAVENOUS | Status: DC | PRN
  Administered 2017-12-21: 13:00:00 10 mL via INTRAVENOUS
  Filled 2017-12-21: qty 10

## 2017-12-21 MED ORDER — SPIRONOLACTONE 25 MG PO TABS: 25.0000 mg | ORAL_TABLET | Freq: Every day | ORAL | Status: AC

## 2017-12-21 MED ORDER — MIDODRINE HCL 10 MG PO TABS: 10.0000 mg | ORAL_TABLET | Freq: Three times a day (TID) | ORAL | Status: AC

## 2017-12-21 NOTE — Clinical Social Work Note (Signed)
The patient will discharge today to [facility] via non-emergent EMS. The patient and facility are aware and in agreement. The CSW has delivered the discharge packet and has alerted the attending RN that the patient will require a bariatric stretcher for EMS. The CSW is signing off. Please consult should additional needs arise.  Argentina Ponder, MSW, [place] [phone]

## 2017-12-21 NOTE — Progress Notes (Signed)
TC report to [place] at [facility] with pt accepted. EMS to be called for transport.

## 2017-12-21 NOTE — Anesthesia Postprocedure Evaluation (Signed)
Anesthesia Post Note  Patient: Chandlor Noecker  Procedure(s) Performed: ESOPHAGOGASTRODUODENOSCOPY (EGD) (N/A )  Patient location during evaluation: Endoscopy Anesthesia Type: General Level of consciousness: awake and alert Pain management: pain level controlled Vital Signs Assessment: post-procedure vital signs reviewed and stable Respiratory status: spontaneous breathing, nonlabored ventilation, respiratory function stable and patient connected to nasal cannula oxygen Cardiovascular status: blood pressure returned to baseline and stable Postop Assessment: no apparent nausea or vomiting Anesthetic complications: no     Last Vitals:  Vitals:   12/21/17 0445 12/21/17 0834  BP: 114/63 106/86  Pulse: (!) 104 (!) 103  Resp: 17   Temp: 36.9 C   SpO2: 98% 95%    Last Pain:  Vitals:   12/21/17 0835  TempSrc:   PainSc: 0-No pain                 Lenard Simmer

## 2017-12-21 NOTE — Progress Notes (Signed)
TC to [facility] to given report. Reports they gave his bed away and is not looking of bed assignement. They are to call me back with contact information given.

## 2017-12-21 NOTE — Discharge Instructions (Signed)
Hyponatremia Hyponatremia is when the amount of salt (sodium) in your blood is too low. When salt levels are low, your cells absorb extra water and they swell. The swelling happens throughout the body, but it mostly affects the brain. Follow these instructions at home:  Take medicines only as told by your doctor. Many medicines can make this condition worse. Talk with your doctor about any medicines that you are currently taking.  Carefully follow a recommended diet as told by your doctor.  Carefully follow instructions from your doctor about fluid restrictions.  Keep all follow-up visits as told by your doctor. This is important.  Do not drink alcohol. Contact a doctor if:  You feel sicker to your stomach (nauseous).  You feel more confused.  You feel more tired (fatigued).  Your headache gets worse.  You feel weaker.  Your symptoms go away and then they come back.  You have trouble following the diet instructions. Get help right away if:  You start to twitch and shake (have a seizure).  You pass out (faint).  You keep having watery poop (diarrhea).  You keep throwing up (vomiting). This information is not intended to replace advice given to you by your health care provider. Make sure you discuss any questions you have with your health care provider. Document Released: 04/04/2011 Document Revised: 12/29/2015 Document Reviewed: 07/19/2014 Elsevier Interactive Patient Education  2018 [employer]  

## 2017-12-21 NOTE — Discharge Summary (Signed)
[facility] [place] [facility]   PATIENT NAME: Antonio York    MR#:  [id number]  DATE OF BIRTH:  03/15/1961  DATE OF ADMISSION:  12/14/2017 ADMITTING PHYSICIAN: Adrian Saran, MD  DATE OF DISCHARGE: 12/21/2017  PRIMARY CARE PHYSICIAN: Dimple Casey, MD    ADMISSION DIAGNOSIS:  Hyponatremia [E87.1] Hypotension, unspecified hypotension type [I95.9] Altered mental status, unspecified altered mental status type [R41.82]  DISCHARGE DIAGNOSIS:  Active Problems:   Hyponatremia   Hepatic cirrhosis (HCC)   SECONDARY DIAGNOSIS:   Past Medical History:  Diagnosis Date  . Asthma   . Cerebral palsy (HCC)   . CHF (congestive heart failure) (HCC)   . COPD (chronic obstructive pulmonary disease) (HCC)   . 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  1.  Altered mental status-this was encephalopathy secondary to hepatic encephalopathy combined with hyponatremia. - Patient was given lactulose, maintained on Xifaxan and his sodium was corrected with a Lasix drip.  His mental status is much improved and he is back to baseline now.  He is therefore being discharged.  2.  Hyponatremia-this was hypervolemic hyponatremia secondary to chronic liver disease/anasarca.  Patient also had some element of intravascular depletion as per nephrology.  Patient was started on Lasix drip and his sodium has improved and is stable at 129 today.  Some mental status is improved.  Lasix drip has been discontinued over the past 24 hours. -As per nephrology patient's diuretics have been tapered.  He is being discharged on oral torsemide at 40 mg and Aldactone 25 mg with further follow-up with nephrology as an outpatient.  3.  Acute on chronic anemia-this  is secondary to patient's chronic liver disease.  Patient was seen by gastroenterology and underwent an upper GI endoscopy which showed no evidence of esophageal varices or acute bleeding.  He did have portal gastropathy.  Patient will continue his Protonix. Patient did receive 1 unit of packed red blood cells while in the hospital.  Hemoglobin is stable at 7.3 upon discharge.  4.  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 -His diuretics have been tapered as mentioned above.  He will continue some Midrin for chronic hypotension.  5.  History of hemochromatosis-patient has been not required any phlebotomy.  He will continue follow-up with gastroenterology as outpatient.  6.  Status post fall with left shin laceration-this was sutured and staples were placed by the ER physician. -Post to be removed on Dec 28, 2017.  - Wound care to left LE with full thickness wound approximated with staples:  Cleanse with NS, pat gently dry. Cover wound with silver hydrofiber (Aquacel Ag+, Hart [place] [phone]). Pad anterior and posterior LE with ABD dressing.  Wrap LE from toes to knees with Kerlix roll gauze, top with ACE bandage wrapped in a similar fashion.  7.  Chronic lower extremity edema with wounds- patient was seen by wound care. - Wound care to Right LE:  Cleanse with NS, pat gently dry.  PAd anterior and posterior LE with ABD pad.  Wrap from toes to knee with Kerlix roll gauze. Top Kerlix with ACE bandage wrapped in a similar fashion.   She is being discharged to a skilled nursing  facility for ongoing care.  Patient is in agreement with this plan.  DISCHARGE CONDITIONS:   Stable  CONSULTS OBTAINED:  Treatment Team:  Mady Haagensen, MD Toney Reil, MD  DRUG ALLERGIES:  No Known Allergies  DISCHARGE MEDICATIONS:   Allergies as of 12/21/2017   No Known  Allergies     Medication List    STOP taking these medications   polyethylene glycol packet Commonly known as:  MIRALAX / GLYCOLAX     TAKE these medications   FIRST-DUKES MOUTHWASH Susp Swish and swallow 5 mLs 3 (three) times daily before meals.   hydrocortisone cream 1 % Apply 1 application topically every 6 (six) hours as needed for itching. Apply to abdomen.   lactulose 10 GM/15ML solution Commonly known as:  CHRONULAC Take 45 mLs (30 g total) by mouth 3 (three) times daily.   midodrine 10 MG tablet Commonly known as:  PROAMATINE Take 1 tablet (10 mg total) by mouth 3 (three) times daily with meals.   pantoprazole 40 MG tablet Commonly known as:  PROTONIX Take 1 tablet (40 mg total) by mouth daily.   promethazine 12.5 MG tablet Commonly known as:  PHENERGAN Take 12.5 mg by mouth every 6 (six) hours as needed for nausea/vomiting.   spironolactone 25 MG tablet Commonly known as:  ALDACTONE Take 1 tablet (25 mg total) by mouth daily. What changed:    medication strength  how much to take   torsemide 20 MG tablet Commonly known as:  DEMADEX Take 2 tablets (40 mg total) by mouth daily. What changed:  how much to take   XIFAXAN 550 MG Tabs tablet Generic drug:  rifaximin Take 550 mg by mouth 2 (two) times daily.         DISCHARGE INSTRUCTIONS:   DIET:  Cardiac diet  DISCHARGE CONDITION:  Stable  ACTIVITY:  Activity as tolerated  OXYGEN:  Home Oxygen: No.   Oxygen Delivery: room air  DISCHARGE LOCATION:  nursing home   If you experience worsening of your admission symptoms, develop shortness of breath, life threatening emergency, suicidal or homicidal thoughts you must seek medical attention immediately by calling 911 or calling your MD immediately  if symptoms less severe.  You Must read complete instructions/literature along with all the possible adverse reactions/side effects for all the Medicines you take and that have been prescribed to  you. Take any new Medicines after you have completely understood and accpet all the possible adverse reactions/side effects.   Please note  You were cared for by a hospitalist during your hospital stay. If you have any questions about your discharge medications or the care you received while you were in the hospital after you are discharged, you can call the unit and asked to speak with the hospitalist on call if the hospitalist that took care of you is not available. Once you are discharged, your primary care physician will handle any further medical issues. Please note that NO REFILLS for any discharge medications will be authorized once you are discharged, as it is imperative that you return to your primary care physician (or establish a relationship with a primary care physician if you do not have one) for your aftercare needs so that they can reassess your need for medications and monitor your lab values.     Today   No acute events overnight, sodium level stable.  Mental status back to baseline.  Patient is agreement to be discharged back to his skilled nursing facility.  VITAL SIGNS:  Blood pressure 106/86, pulse (!) 103, temperature 98.5 F (36.9 C), temperature source Oral, resp. rate 17, height  (1.676 m), weight (!) 156.8 kg (345 lb 9.6 oz), SpO2 95 %.  I/O:    Intake/Output Summary (Last 24 hours) at 12/21/2017 1029 Last data filed at 12/20/2017 1422 Gross per 24 hour  Intake 100 ml  Output 1200 ml  Net -1100 ml    PHYSICAL EXAMINATION:  GENERAL:  57 y.o.-year-old morbidly obese patient lying in the bed with no acute distress.  EYES: Pupils equal, round, reactive to light and accommodation. No scleral icterus. Extraocular muscles intact.  HEENT: Head atraumatic, normocephalic. Oropharynx and nasopharynx clear.  NECK:  Supple, no jugular venous distention. No thyroid enlargement, no tenderness.  LUNGS: Normal breath sounds bilaterally, no wheezing, rales,rhonchi. No use  of accessory muscles of respiration.  CARDIOVASCULAR: S1, S2 normal. No murmurs, rubs, or gallops.  ABDOMEN: Soft, non-tender, non-distended. Bowel sounds present. No organomegaly or mass.  EXTREMITIES: Nocyanosis, or clubbing. +1-2 edema b/l.  B/l LE dressings in place.   NEUROLOGIC: Cranial nerves II through XII are intact. No focal motor or sensory defecits b/l. Globally weak.  PSYCHIATRIC: The patient is alert and oriented x 3.   SKIN: No obvious rash, lesion, or ulcer.   DATA REVIEW:   CBC Recent Labs  Lab 12/21/17 0502  WBC 4.6  HGB 7.6*  HCT 22.1*  PLT 101*    Chemistries  Recent Labs  Lab 12/21/17 0502  NA 129*  K 3.3*  CL 98*  CO2 23  GLUCOSE 120*  BUN 19  CREATININE 0.88  CALCIUM 8.2*  AST 45*  ALT 29  ALKPHOS 111  BILITOT 6.2*    Cardiac Enzymes No results for input(s): TROPONINI in the last 168 hours.  Microbiology Results  Results for orders placed or performed during the hospital encounter of 12/14/17  Blood culture (routine x 2)     Status: None   Collection Time: 12/14/17  9:22 AM  Result Value Ref Range Status   Specimen Description BLOOD R PORT  Final   Special Requests   Final    BOTTLES DRAWN AEROBIC AND ANAEROBIC Blood Culture results may not be optimal due to an excessive volume of blood received in culture bottles   Culture   Final    NO GROWTH 5 DAYS Performed at [facility], [address], [place], [place] [address]    Report Status 12/19/2017 FINAL  Final  Blood culture (routine x 2)     Status: None   Collection Time: 12/14/17 12:58 PM  Result Value Ref Range Status   Specimen Description BLOOD RT [facility]  Final   Special Requests   Final    BOTTLES DRAWN AEROBIC AND ANAEROBIC Blood Culture adequate volume   Culture   Final    NO GROWTH 5 DAYS Performed at [facility], [address] [place], [place] [address]    Report Status 12/19/2017 FINAL  Final  MRSA PCR Screening     Status: None   Collection Time:  12/14/17  3:42 PM  Result Value Ref Range Status   MRSA by PCR NEGATIVE NEGATIVE Final    Comment:        The GeneXpert MRSA Assay (FDA approved for NASAL specimens only), is one component of a comprehensive MRSA colonization surveillance program. It is not intended to diagnose MRSA infection nor to guide or monitor treatment for MRSA infections. Performed at [facility], [address]  [place], [place] [address]   Urine culture     Status: Abnormal   Collection Time: 12/15/17 12:25 AM  Result Value Ref Range Status   Specimen Description   Final    URINE, RANDOM Performed at [facility], [address] [place], [place] [address]    Special Requests   Final    NONE Performed at [facility], [address] [place], [place] [address]    Culture (A)  Final    <10,000 COLONIES/mL INSIGNIFICANT GROWTH Performed at [facility], [address] [place], [place] [address]    Report Status 12/16/2017 FINAL  Final    RADIOLOGY:  No results found.    Management plans discussed with the patient, family and they are in agreement.  CODE STATUS:     Code Status Orders  (From admission, onward)        Start     Ordered   12/17/17 1407  Do not attempt resuscitation (DNR)  Continuous    Question Answer Comment  In the event of cardiac or respiratory ARREST Do not call a "code blue"   In the event of cardiac or respiratory ARREST Do not perform Intubation, CPR, defibrillation or ACLS   In the event of cardiac or respiratory ARREST Use medication by any route, position, wound care, and other measures to relive pain and suffering. May use oxygen, suction and manual treatment of airway obstruction as needed for comfort.   Comments Intubate for respiratory distress. MOST form in chart.      12/17/17 1407      TOTAL TIME TAKING CARE OF THIS PATIENT: 45 minutes.    Houston Siren M.D on 12/21/2017 at 10:29 AM  Between 7am to 6pm -  Pager - [phone]  After 6pm go to [email or web address] - Social research officer, government  [facility] [place] [facility]  Office  [phone]  CC: Primary care physician; Dimple Casey, MD

## 2017-12-21 NOTE — Progress Notes (Signed)
[facility]  ROUNDING NOTE   Subjective:   Na 129  UOP 1200  hgb 7.6  EGD with portal hypertension gastropathy  Objective:  Vital signs in last 24 hours:  Temp:  [97 F (36.1 C)-98.5 F (36.9 C)] 98.5 F (36.9 C) (05/18 0445) Pulse Rate:  [99-105] 103 (05/18 0834) Resp:  [16-20] 17 (05/18 0445) BP: (96-122)/(36-99) 106/86 (05/18 0834) SpO2:  [95 %-100 %] 95 % (05/18 0834) Weight:  [156.8 kg (345 lb 9.6 oz)] 156.8 kg (345 lb 9.6 oz) (05/18 0445)  Weight change: 1.179 kg (2 lb 9.6 oz) Filed Weights   12/19/17 0419 12/20/17 0425 12/21/17 0445  Weight: (!) 155.6 kg (343 lb 1.6 oz) (!) 155.6 kg (343 lb) (!) 156.8 kg (345 lb 9.6 oz)    Intake/Output: I/O last 3 completed shifts: In: 398.2 [I.V.:398.2] Out: 2400 [Urine:2400]   Intake/Output this shift:  No intake/output data recorded.  Physical Exam: General: NAD, laying in bed  Head: Normocephalic, atraumatic. Moist oral mucosal membranes  Eyes: Anicteric, PERRL  Neck: Supple, trachea midline  Lungs:  Clear to auscultation  Heart: Regular rate and rhythm  Abdomen:  Obese   Extremities: trace peripheral edema.  Neurologic: Nonfocal, moving all four extremities  Skin: No lesions       Basic Metabolic Panel: Recent Labs  Lab 12/17/17 0323  12/18/17 0346  12/18/17 1706 12/18/17 2358 12/19/17 0455 12/20/17 0509 12/21/17 0502  NA 124*   < > 125*   < > 125* 126* 128* 129* 129*  K 4.1  --  3.9  --   --   --  3.7 3.5 3.3*  CL 96*  --  95*  --   --   --  96* 97* 98*  CO2 22  --  22  --   --   --  21* 24 23  GLUCOSE 102*  --  99  --   --   --  113* 129* 120*  BUN 23*  --  25*  --   --   --  22* 19 19  CREATININE 1.11  --  1.19  --   --   --  1.10 1.07 0.88  CALCIUM 8.4*  --  8.7*  --   --   --  8.4* 8.2* 8.2*   < > = values in this interval not displayed.    Liver Function Tests: Recent Labs  Lab 12/17/17 0323 12/19/17 0455 12/21/17 0502  AST 70* 52* 45*  ALT 40 32 29  ALKPHOS 109 103 111   BILITOT 5.8* 7.6* 6.2*  PROT 5.9* 5.8* 5.9*  ALBUMIN 3.3* 3.2* 3.1*   No results for input(s): LIPASE, AMYLASE in the last 168 hours. Recent Labs  Lab 12/16/17 1314 12/18/17 1125  AMMONIA 71* 79*    CBC: Recent Labs  Lab 12/15/17 0021 12/17/17 0323 12/17/17 1528 12/18/17 0346 12/19/17 0455 12/21/17 0502  WBC 8.2 6.5  --  7.7 6.6 4.6  HGB 7.8* 6.7* 7.6* 7.7* 7.4* 7.6*  HCT 21.9* 19.4* 21.6* 21.5* 21.1* 22.1*  MCV 103.5* 104.2*  --  102.6* 103.1* 104.0*  PLT 111* 92*  --  96* 96* 101*    Cardiac Enzymes: No results for input(s): CKTOTAL, CKMB, CKMBINDEX, TROPONINI in the last 168 hours.  BNP: Invalid input(s): POCBNP  CBG: No results for input(s): GLUCAP in the last 168 hours.  Microbiology: Results for orders placed or performed during the hospital encounter of 12/14/17  Blood culture (routine x 2)  Status: None   Collection Time: 12/14/17  9:22 AM  Result Value Ref Range Status   Specimen Description BLOOD R PORT  Final   Special Requests   Final    BOTTLES DRAWN AEROBIC AND ANAEROBIC Blood Culture results may not be optimal due to an excessive volume of blood received in culture bottles   Culture   Final    NO GROWTH 5 DAYS Performed at [facility], [address], [place], [place] [address]    Report Status 12/19/2017 FINAL  Final  Blood culture (routine x 2)     Status: None   Collection Time: 12/14/17 12:58 PM  Result Value Ref Range Status   Specimen Description BLOOD RT [facility]  Final   Special Requests   Final    BOTTLES DRAWN AEROBIC AND ANAEROBIC Blood Culture adequate volume   Culture   Final    NO GROWTH 5 DAYS Performed at [facility], [address] [place], [place] [address]    Report Status 12/19/2017 FINAL  Final  MRSA PCR Screening     Status: None   Collection Time: 12/14/17  3:42 PM  Result Value Ref Range Status   MRSA by PCR NEGATIVE NEGATIVE Final    Comment:        The GeneXpert MRSA Assay (FDA approved  for NASAL specimens only), is one component of a comprehensive MRSA colonization surveillance program. It is not intended to diagnose MRSA infection nor to guide or monitor treatment for MRSA infections. Performed at [facility], [address], [place], [place] [address]   Urine culture     Status: Abnormal   Collection Time: 12/15/17 12:25 AM  Result Value Ref Range Status   Specimen Description   Final    URINE, RANDOM Performed at [facility], [address], [place], [place] [address]    Special Requests   Final    NONE Performed at [facility], [address] [place], [place] [address]    Culture (A)  Final    <10,000 COLONIES/mL INSIGNIFICANT GROWTH Performed at [facility], [address], [place], [place] [address]    Report Status 12/16/2017 FINAL  Final    Coagulation Studies: No results for input(s): LABPROT, INR in the last 72 hours.  Urinalysis: No results for input(s): COLORURINE, LABSPEC, PHURINE, GLUCOSEU, HGBUR, BILIRUBINUR, KETONESUR, PROTEINUR, UROBILINOGEN, NITRITE, LEUKOCYTESUR in the last 72 hours.  Invalid input(s): APPERANCEUR    Imaging: No results found.   Medications:    . bisacodyl  5 mg Oral Daily  . enoxaparin (LOVENOX) injection  40 mg Subcutaneous Q12H  . lactulose  30 g Oral QID  . midodrine  10 mg Oral TID WC  . pantoprazole  40 mg Oral Daily  . polyethylene glycol  17 g Oral QHS  . rifaximin  550 mg Oral BID  . senna-docusate  2 tablet Oral BID  . spironolactone  25 mg Oral Daily  . torsemide  40 mg Oral Daily   acetaminophen **OR** acetaminophen, guaiFENesin, oxyCODONE, polyethylene glycol, promethazine, sodium chloride flush  Assessment/ Plan:  [name] is a 57 y.o. 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   1.  Hyponatremia. 2.  Hypotension. 3.  Liver cirrhosis secondary to [place]/hemochromatosis. MELD score on 5/14 of 30 4.  Anemia unspecified.  Plan: Consistent with hypervolemic hypo-osmolar hyponatremia secondary to hepatic cirrhosis - Fluid restriction - Completed 4 days of IV albumin.  - Continue torsemide  daily - Continue spironolactone  daily - Continue midodrine  tid   LOS: 7 Antonio York 5/18/201911:21 AM

## 2017-12-21 NOTE — Progress Notes (Signed)
Pt condition stablized with no issues. No new orders/changes. Discussed with Dr. Cherlynn Kaiser regarding staple removal and they will be followed by wound care.See AVS.

## 2017-12-21 NOTE — Progress Notes (Addendum)
Pt released to [facility] for transport to [facility]. Discharge packet with AVS sent. Wife notified via TC that pt has been discharged and on way to facility.

## 2017-12-23 ENCOUNTER — Encounter: Payer: Self-pay | Admitting: Internal Medicine

## 2017-12-26 ENCOUNTER — Encounter: Payer: Self-pay | Admitting: Emergency Medicine

## 2017-12-26 ENCOUNTER — Emergency Department: Payer: Medicare Other

## 2017-12-26 ENCOUNTER — Inpatient Hospital Stay
Admission: EM | Admit: 2017-12-26 | Discharge: 2018-01-06 | DRG: 442 | Disposition: A | Discharge status: Skilled Nursing Facility | Payer: Medicare Other | Attending: Internal Medicine | Admitting: Internal Medicine

## 2017-12-26 ENCOUNTER — Other Ambulatory Visit: Payer: Self-pay

## 2017-12-26 DIAGNOSIS — K5900 Constipation, unspecified: Secondary | ICD-10-CM | POA: Diagnosis present

## 2017-12-26 DIAGNOSIS — D649 Anemia, unspecified: Secondary | ICD-10-CM | POA: Diagnosis not present

## 2017-12-26 DIAGNOSIS — Y92481 Parking lot as the place of occurrence of the external cause: Secondary | ICD-10-CM | POA: Diagnosis not present

## 2017-12-26 DIAGNOSIS — I5032 Chronic diastolic (congestive) heart failure: Secondary | ICD-10-CM | POA: Diagnosis present

## 2017-12-26 DIAGNOSIS — K766 Portal hypertension: Secondary | ICD-10-CM | POA: Diagnosis present

## 2017-12-26 DIAGNOSIS — R040 Epistaxis: Secondary | ICD-10-CM | POA: Diagnosis present

## 2017-12-26 DIAGNOSIS — K7682 Hepatic encephalopathy: Secondary | ICD-10-CM

## 2017-12-26 DIAGNOSIS — J449 Chronic obstructive pulmonary disease, unspecified: Secondary | ICD-10-CM | POA: Diagnosis present

## 2017-12-26 DIAGNOSIS — R062 Wheezing: Secondary | ICD-10-CM

## 2017-12-26 DIAGNOSIS — L8990 Pressure ulcer of unspecified site, unspecified stage: Secondary | ICD-10-CM

## 2017-12-26 DIAGNOSIS — Z79899 Other long term (current) drug therapy: Secondary | ICD-10-CM

## 2017-12-26 DIAGNOSIS — K7460 Unspecified cirrhosis of liver: Secondary | ICD-10-CM | POA: Diagnosis present

## 2017-12-26 DIAGNOSIS — R0989 Other specified symptoms and signs involving the circulatory and respiratory systems: Secondary | ICD-10-CM

## 2017-12-26 DIAGNOSIS — D751 Secondary polycythemia: Secondary | ICD-10-CM | POA: Diagnosis present

## 2017-12-26 DIAGNOSIS — R109 Unspecified abdominal pain: Secondary | ICD-10-CM

## 2017-12-26 DIAGNOSIS — D638 Anemia in other chronic diseases classified elsewhere: Secondary | ICD-10-CM | POA: Diagnosis present

## 2017-12-26 DIAGNOSIS — R52 Pain, unspecified: Secondary | ICD-10-CM

## 2017-12-26 DIAGNOSIS — L89301 Pressure ulcer of unspecified buttock, stage 1: Secondary | ICD-10-CM | POA: Diagnosis present

## 2017-12-26 DIAGNOSIS — E8809 Other disorders of plasma-protein metabolism, not elsewhere classified: Secondary | ICD-10-CM | POA: Diagnosis present

## 2017-12-26 DIAGNOSIS — M4856XA Collapsed vertebra, not elsewhere classified, lumbar region, initial encounter for fracture: Secondary | ICD-10-CM | POA: Diagnosis present

## 2017-12-26 DIAGNOSIS — G479 Sleep disorder, unspecified: Secondary | ICD-10-CM | POA: Diagnosis present

## 2017-12-26 DIAGNOSIS — R17 Unspecified jaundice: Secondary | ICD-10-CM | POA: Diagnosis not present

## 2017-12-26 DIAGNOSIS — Z6841 Body Mass Index (BMI) 40.0 and over, adult: Secondary | ICD-10-CM | POA: Diagnosis not present

## 2017-12-26 DIAGNOSIS — M549 Dorsalgia, unspecified: Secondary | ICD-10-CM

## 2017-12-26 DIAGNOSIS — Z66 Do not resuscitate: Secondary | ICD-10-CM | POA: Diagnosis present

## 2017-12-26 DIAGNOSIS — K7290 Hepatic failure, unspecified without coma: Secondary | ICD-10-CM | POA: Diagnosis present

## 2017-12-26 DIAGNOSIS — F4320 Adjustment disorder, unspecified: Secondary | ICD-10-CM | POA: Diagnosis present

## 2017-12-26 DIAGNOSIS — F4329 Adjustment disorder with other symptoms: Secondary | ICD-10-CM

## 2017-12-26 DIAGNOSIS — D6959 Other secondary thrombocytopenia: Secondary | ICD-10-CM | POA: Diagnosis present

## 2017-12-26 DIAGNOSIS — Z7401 Bed confinement status: Secondary | ICD-10-CM

## 2017-12-26 DIAGNOSIS — G809 Cerebral palsy, unspecified: Secondary | ICD-10-CM | POA: Diagnosis present

## 2017-12-26 DIAGNOSIS — T502X5A Adverse effect of carbonic-anhydrase inhibitors, benzothiadiazides and other diuretics, initial encounter: Secondary | ICD-10-CM | POA: Diagnosis present

## 2017-12-26 DIAGNOSIS — K7200 Acute and subacute hepatic failure without coma: Principal | ICD-10-CM | POA: Diagnosis present

## 2017-12-26 DIAGNOSIS — D696 Thrombocytopenia, unspecified: Secondary | ICD-10-CM | POA: Diagnosis not present

## 2017-12-26 DIAGNOSIS — K219 Gastro-esophageal reflux disease without esophagitis: Secondary | ICD-10-CM | POA: Diagnosis present

## 2017-12-26 DIAGNOSIS — K7581 Nonalcoholic steatohepatitis (NASH): Secondary | ICD-10-CM | POA: Diagnosis present

## 2017-12-26 DIAGNOSIS — I9589 Other hypotension: Secondary | ICD-10-CM | POA: Diagnosis present

## 2017-12-26 DIAGNOSIS — E871 Hypo-osmolality and hyponatremia: Secondary | ICD-10-CM | POA: Diagnosis present

## 2017-12-26 DIAGNOSIS — G8929 Other chronic pain: Secondary | ICD-10-CM | POA: Diagnosis present

## 2017-12-26 DIAGNOSIS — K7469 Other cirrhosis of liver: Secondary | ICD-10-CM | POA: Diagnosis not present

## 2017-12-26 DIAGNOSIS — D539 Nutritional anemia, unspecified: Secondary | ICD-10-CM | POA: Diagnosis present

## 2017-12-26 DIAGNOSIS — W1830XA Fall on same level, unspecified, initial encounter: Secondary | ICD-10-CM | POA: Diagnosis present

## 2017-12-26 LAB — HEPATIC FUNCTION PANEL
ALBUMIN: 2.9 g/dL — AB (ref 3.5–5.0)
ALT: 39 U/L (ref 17–63)
AST: 83 U/L — AB (ref 15–41)
Alkaline Phosphatase: 115 U/L (ref 38–126)
Bilirubin, Direct: 2.3 mg/dL — ABNORMAL HIGH (ref 0.1–0.5)
Indirect Bilirubin: 6.2 mg/dL — ABNORMAL HIGH (ref 0.3–0.9)
TOTAL PROTEIN: 6.2 g/dL — AB (ref 6.5–8.1)
Total Bilirubin: 8.5 mg/dL — ABNORMAL HIGH (ref 0.3–1.2)

## 2017-12-26 LAB — BASIC METABOLIC PANEL
Anion gap: 7 (ref 5–15)
BUN: 16 mg/dL (ref 6–20)
CALCIUM: 8.8 mg/dL — AB (ref 8.9–10.3)
CO2: 26 mmol/L (ref 22–32)
Chloride: 99 mmol/L — ABNORMAL LOW (ref 101–111)
Creatinine, Ser: 0.88 mg/dL (ref 0.61–1.24)
GFR calc Af Amer: >60 mL/min (ref >60)
GLUCOSE: 159 mg/dL — AB (ref 65–99)
POTASSIUM: 3.3 mmol/L — AB (ref 3.5–5.1)
SODIUM: 132 mmol/L — AB (ref 135–145)

## 2017-12-26 LAB — CBC WITH DIFFERENTIAL/PLATELET
BASOS ABS: 0 10*3/uL (ref 0–0.1)
Basophils Relative: 0 %
Eosinophils Absolute: 0 10*3/uL (ref 0–0.7)
Eosinophils Relative: 0 %
HCT: 25.8 % — ABNORMAL LOW (ref 40.0–52.0)
HEMOGLOBIN: 9 g/dL — AB (ref 13.0–18.0)
LYMPHS ABS: 0.7 10*3/uL — AB (ref 1.0–3.6)
LYMPHS PCT: 9 %
MCH: 36 pg — AB (ref 26.0–34.0)
MCHC: 34.9 g/dL (ref 32.0–36.0)
MCV: 103.3 fL — AB (ref 80.0–100.0)
Monocytes Absolute: 1.1 10*3/uL — ABNORMAL HIGH (ref 0.2–1.0)
Monocytes Relative: 13 %
NEUTROS PCT: 78 %
Neutro Abs: 6.6 10*3/uL — ABNORMAL HIGH (ref 1.4–6.5)
Platelets: 139 10*3/uL — ABNORMAL LOW (ref 150–440)
RBC: 2.5 MIL/uL — AB (ref 4.40–5.90)
RDW: 17.6 % — ABNORMAL HIGH (ref 11.5–14.5)
WBC: 8.5 10*3/uL (ref 3.8–10.6)

## 2017-12-26 LAB — AMMONIA: Ammonia: 109 umol/L — ABNORMAL HIGH (ref 9–35)

## 2017-12-26 LAB — PROTIME-INR
INR: 2.33
PROTHROMBIN TIME: 25.4 s — AB (ref 11.4–15.2)

## 2017-12-26 LAB — LIPASE, BLOOD: LIPASE: 30 U/L (ref 11–51)

## 2017-12-26 MED ORDER — PANTOPRAZOLE SODIUM 40 MG PO TBEC
40.0000 mg | DELAYED_RELEASE_TABLET | Freq: Every day | ORAL | Status: DC
  Administered 2017-12-27 – 2018-01-02 (×8): 40 mg via ORAL
  Filled 2017-12-26 (×7): qty 1

## 2017-12-26 MED ORDER — RIFAXIMIN 550 MG PO TABS: 550.0000 mg | ORAL_TABLET | Freq: Two times a day (BID) | ORAL | Status: DC

## 2017-12-26 MED ORDER — MIDODRINE HCL 5 MG PO TABS
10.0000 mg | ORAL_TABLET | Freq: Three times a day (TID) | ORAL | Status: DC
  Administered 2017-12-27 – 2018-01-06 (×28): 10 mg via ORAL
  Filled 2017-12-26 (×34): qty 2

## 2017-12-26 MED ORDER — ORAL CARE MOUTH RINSE
15.0000 mL | Freq: Two times a day (BID) | OROMUCOSAL | Status: DC
  Administered 2017-12-26 – 2018-01-06 (×15): 15 mL via OROMUCOSAL

## 2017-12-26 MED ORDER — ACETAMINOPHEN 650 MG RE SUPP: 650.0000 mg | Freq: Four times a day (QID) | RECTAL | Status: DC | PRN

## 2017-12-26 MED ORDER — ACETAMINOPHEN 325 MG PO TABS: 650.0000 mg | ORAL_TABLET | Freq: Four times a day (QID) | ORAL | Status: DC | PRN

## 2017-12-26 MED ORDER — ONDANSETRON HCL 4 MG PO TABS
4.0000 mg | ORAL_TABLET | Freq: Four times a day (QID) | ORAL | Status: DC | PRN
  Administered 2018-01-01: 09:00:00 4 mg via ORAL
  Filled 2017-12-26: qty 1

## 2017-12-26 MED ORDER — RIFAXIMIN 550 MG PO TABS
550.0000 mg | ORAL_TABLET | Freq: Two times a day (BID) | ORAL | Status: DC
  Administered 2017-12-26 – 2018-01-06 (×21): 550 mg via ORAL
  Filled 2017-12-26 (×23): qty 1

## 2017-12-26 MED ORDER — HEPARIN SODIUM (PORCINE) 5000 UNIT/ML IJ SOLN
5000.0000 [IU] | Freq: Three times a day (TID) | INTRAMUSCULAR | Status: DC
  Administered 2017-12-26 – 2018-01-02 (×20): 5000 [IU] via SUBCUTANEOUS
  Filled 2017-12-26 (×21): qty 1

## 2017-12-26 MED ORDER — LACTULOSE 10 GM/15ML PO SOLN
30.0000 g | Freq: Three times a day (TID) | ORAL | Status: DC
  Administered 2017-12-26 – 2017-12-28 (×7): 30 g via ORAL
  Filled 2017-12-26 (×8): qty 60

## 2017-12-26 MED ORDER — HYDROCORTISONE 1 % EX CREA
1.0000 "application " | TOPICAL_CREAM | Freq: Four times a day (QID) | CUTANEOUS | Status: DC | PRN
  Filled 2017-12-26: qty 28

## 2017-12-26 MED ORDER — SPIRONOLACTONE 25 MG PO TABS
25.0000 mg | ORAL_TABLET | Freq: Every day | ORAL | Status: DC
  Administered 2017-12-27 – 2018-01-06 (×11): 25 mg via ORAL
  Filled 2017-12-26 (×12): qty 1

## 2017-12-26 MED ORDER — TORSEMIDE 20 MG PO TABS
40.0000 mg | ORAL_TABLET | Freq: Every day | ORAL | Status: DC
  Administered 2017-12-27: 08:00:00 40 mg via ORAL
  Filled 2017-12-26: qty 2

## 2017-12-26 MED ORDER — LACTULOSE 10 GM/15ML PO SOLN
30.0000 g | Freq: Once | ORAL | Status: AC
Start: 2017-12-26 — End: 2017-12-26
  Administered 2017-12-26: 30 g via ORAL
  Filled 2017-12-26: qty 60

## 2017-12-26 MED ORDER — ONDANSETRON HCL 4 MG/2ML IJ SOLN
4.0000 mg | Freq: Four times a day (QID) | INTRAMUSCULAR | Status: DC | PRN
  Administered 2018-01-04 (×2): 4 mg via INTRAVENOUS
  Filled 2017-12-26: qty 2

## 2017-12-26 NOTE — H&P (Signed)
[employer] - [place] [facility]   PATIENT NAME: Antonio York    MR#:  [id number]  DATE OF BIRTH:  10/21/1960  DATE OF ADMISSION:  12/26/2017  PRIMARY CARE PHYSICIAN: Dimple Casey, MD   REQUESTING/REFERRING PHYSICIAN: Dr. Willy Eddy  CHIEF COMPLAINT:   Chief Complaint  Patient presents with  . Abdominal Pain  Altered mental status  HISTORY OF PRESENT ILLNESS:  Antonio York  is a 57 y.o. male with a known history of liver cirrhosis, COPD, history of cerebral palsy, asthma, chronic diastolic CHF, nonalcoholic fatty liver disease who presents to the hospital from a skilled nursing facility due to altered mental status.  Patient himself cannot give any good history as he is quite encephalopathic.  Patient was noted to have a significantly elevated ammonia at over 100 and noted to be encephalopathic secondary to hepatic encephalopathy.  Hospitalist services were contacted for admission.  Patient denies any chest pains, shortness of breath, nausea, vomiting, fever, chills or any other associated symptoms presently.  PAST MEDICAL HISTORY:   Past Medical History:  Diagnosis Date  . Asthma   . Cerebral palsy (HCC)   . CHF (congestive heart failure) (HCC)   . COPD (chronic obstructive pulmonary disease) (HCC)   . Liver cirrhosis (HCC)     PAST SURGICAL HISTORY:   Past Surgical History:  Procedure Laterality Date  . CHOLECYSTECTOMY    . ESOPHAGOGASTRODUODENOSCOPY N/A 12/20/2017   Procedure: ESOPHAGOGASTRODUODENOSCOPY (EGD);  Surgeon: Toledo, Boykin Nearing, MD;  Location: [facility] ENDOSCOPY;  Service: Gastroenterology;  Laterality: N/A;  . LEG SURGERY Bilateral     SOCIAL HISTORY:   Social History   Tobacco Use  . Smoking status: Never Smoker  . Smokeless tobacco: Never Used  Substance Use Topics  . Alcohol use: Not Currently    FAMILY HISTORY:  No family history on file.  DRUG ALLERGIES:  No Known Allergies  REVIEW OF SYSTEMS:   Review of Systems   Unable to perform ROS: Mental acuity    MEDICATIONS AT HOME:   Prior to Admission medications   Medication Sig Start Date End Date Taking? Authorizing Provider  Diphenhyd-Hydrocort-Nystatin (FIRST-DUKES MOUTHWASH) SUSP Swish and swallow 5 mLs 3 (three) times daily before meals.    [provider]  hydrocortisone cream 1 % Apply 1 application topically every 6 (six) hours as needed for itching. Apply to abdomen.    [provider]  lactulose (CHRONULAC) 10 GM/15ML solution Take 45 mLs (30 g total) by mouth 3 (three) times daily. 12/21/17 01/20/18  Houston Siren, MD  midodrine (PROAMATINE) 10 MG tablet Take 1 tablet (10 mg total) by mouth 3 (three) times daily with meals. 12/21/17   Houston Siren, MD  pantoprazole (PROTONIX) 40 MG tablet Take 1 tablet (40 mg total) by mouth daily. 12/21/17   Houston Siren, MD  promethazine (PHENERGAN) 12.5 MG tablet Take 12.5 mg by mouth every 6 (six) hours as needed for nausea/vomiting.    [provider]  spironolactone (ALDACTONE) 25 MG tablet Take 1 tablet (25 mg total) by mouth daily. 12/21/17   Houston Siren, MD  torsemide (DEMADEX) 20 MG tablet Take 2 tablets (40 mg total) by mouth daily. 12/21/17   Shareta Fishbaugh, Rolly Pancake, MD  XIFAXAN 550 MG TABS tablet Take 550 mg by mouth 2 (two) times daily. 10/16/17   [provider]      VITAL SIGNS:  Blood pressure (!) 117/52, pulse (!) 110, temperature 98.3 F (36.8 C), temperature source  Oral, resp. rate (!) 21, height  (1.676 m), weight (!) 158.8 kg (350 lb), SpO2 98 %.  PHYSICAL EXAMINATION:  Physical Exam  GENERAL:  57 y.o.-year-old obese patient lying in the bed lethargic/encephalopathic.  EYES: Pupils equal, round, reactive to light and accommodation. + scleral icterus. Extraocular muscles intact.  HEENT: Head atraumatic, normocephalic. Oropharynx and nasopharynx clear. No oropharyngeal erythema, moist oral mucosa  NECK:  Supple, no jugular venous distention. No  thyroid enlargement, no tenderness.  LUNGS: Normal breath sounds bilaterally, no wheezing, rales, rhonchi. No use of accessory muscles of respiration.  CARDIOVASCULAR: S1, S2 RRR. No murmurs, rubs, gallops, clicks.  ABDOMEN: Soft, nontender, nondistended. Bowel sounds present. No organomegaly or mass.  EXTREMITIES: No pedal edema, cyanosis, or clubbing. + 2 pedal & radial pulses b/l.   NEUROLOGIC: Cranial nerves II through XII are intact. No focal Motor or sensory deficits appreciated b/l.  Globally weak and encephalopathic.  Positive flapping tremor. PSYCHIATRIC: The patient is alert and oriented x 1.  SKIN: No obvious rash, lesion, or ulcer.   LABORATORY PANEL:   CBC Recent Labs  Lab 12/26/17 1501  WBC 8.5  HGB 9.0*  HCT 25.8*  PLT 139*   ------------------------------------------------------------------------------------------------------------------  Chemistries  Recent Labs  Lab 12/26/17 1501  NA 132*  K 3.3*  CL 99*  CO2 26  GLUCOSE 159*  BUN 16  CREATININE 0.88  CALCIUM 8.8*  AST 83*  ALT 39  ALKPHOS 115  BILITOT 8.5*   ------------------------------------------------------------------------------------------------------------------  Cardiac Enzymes No results for input(s): TROPONINI in the last 168 hours. ------------------------------------------------------------------------------------------------------------------  RADIOLOGY:  US Abdomen Limited Ruq  Result Date: 12/26/2017 CLINICAL DATA:  Elevated bilirubin EXAM: ULTRASOUND ABDOMEN LIMITED RIGHT UPPER QUADRANT COMPARISON:  None. FINDINGS: Gallbladder: The gallbladder has previously been resected Common bile duct: Diameter: There is a large amount of bowel gas and with the patient's body habitus, assessment of the common bile duct is difficult. The best measurement shows a common bile duct measures 4.8 mm in diameter which is within normal limits. Liver: The liver also is somewhat poorly visualized. The  liver appears echogenic and inhomogeneous consistent with hepatic steatosis. No focal hepatic abnormality is seen. Portal vein is patent on color Doppler imaging with normal direction of blood flow towards the liver. IMPRESSION: 1. Echogenic and inhomogeneous liver parenchyma most consistent with hepatic steatosis. No focal abnormality. 2. Prior cholecystectomy. Electronically Signed   By: Dwyane Dee M.D.   On: 12/26/2017 16:42     IMPRESSION AND PLAN:   57 year old male with past medical history of nonalcoholic fatty liver disease, liver cirrhosis, history of hyponatremia, recent admission for altered mental status secondary to encephalopathy/hyponatremia, history of hepatic encephalopathy, COPD, CHF who presents to the hospital due to altered mental status.  1.  Altered mental status-this is secondary to hepatic encephalopathy.  Patient apparently was not getting his lactulose at the skilled nursing facility as he supposed to. - His ammonia level is elevated to greater than 100. -We will resume the patient's lactulose at 30 mg 3 times daily along with his Xifaxan.  Follow ammonia level and follow him clinically.  2.  Hepatic encephalopathy-this is a cause of patient's altered mental status. -We will resume patient's lactulose, Xifaxan.  Follow ammonia and follow the patient clinically.  3.  Chronic hypotension-secondary to chronic liver disease-continue midodrine.  4.  History of chronic diastolic CHF-continue torsemide, Aldactone.  5.  GERD-continue Protonix.   All the records are reviewed and case discussed with ED provider. Management  plans discussed with the patient, family and they are in agreement.  CODE STATUS: DNR  TOTAL TIME TAKING CARE OF THIS PATIENT: 45 minutes.    Houston Siren M.D on 12/26/2017 at 4:49 PM  Between 7am to 6pm - Pager - [phone]  After 6pm go to [email or web address] - password [email or web address] [facility]  [facility]  Office   [phone]  CC: Primary care physician; Dimple Casey, MD

## 2017-12-26 NOTE — ED Provider Notes (Signed)
[facility] Emergency Department Provider Note    First MD Initiated Contact with Patient 12/26/17 1506     (approximate)  I have reviewed the triage vital signs and the nursing notes.   HISTORY  Chief Complaint Abdominal Pain  Level V Caveat:  Acute encephalopathy - presumed hepatic  HPI Antonio York is a 57 y.o. male with a history of liver cirrhosis presents from [place] [facility] facility with worsening confusion generalized malaise and nausea.  Denies any pain at this time.  Did have routine blood work that showed of elevation of ammonia levels as well as bilirubin.  Apparently has not been compliant with his lactulose.  Due to his deterioration patient was brought to the ER for further medical evaluation.  Past Medical History:  Diagnosis Date  . Asthma   . Cerebral palsy (HCC)   . CHF (congestive heart failure) (HCC)   . COPD (chronic obstructive pulmonary disease) (HCC)   . Liver cirrhosis (HCC)    No family history on file. Past Surgical History:  Procedure Laterality Date  . CHOLECYSTECTOMY    . ESOPHAGOGASTRODUODENOSCOPY N/A 12/20/2017   Procedure: ESOPHAGOGASTRODUODENOSCOPY (EGD);  Surgeon: Toledo, Boykin Nearing, MD;  Location: [facility] ENDOSCOPY;  Service: Gastroenterology;  Laterality: N/A;  . LEG SURGERY Bilateral    Patient Active Problem List   Diagnosis Date Noted  . Hepatic cirrhosis (HCC) 12/18/2017  . Hyponatremia 12/14/2017      Prior to Admission medications   Medication Sig Start Date End Date Taking? Authorizing Provider  Diphenhyd-Hydrocort-Nystatin (FIRST-DUKES MOUTHWASH) SUSP Swish and swallow 5 mLs 3 (three) times daily before meals.    [provider]  hydrocortisone cream 1 % Apply 1 application topically every 6 (six) hours as needed for itching. Apply to abdomen.    [provider]  lactulose (CHRONULAC) 10 GM/15ML solution Take 45 mLs (30 g total) by mouth 3 (three) times daily. 12/21/17 01/20/18   Houston Siren, MD  midodrine (PROAMATINE) 10 MG tablet Take 1 tablet (10 mg total) by mouth 3 (three) times daily with meals. 12/21/17   Houston Siren, MD  pantoprazole (PROTONIX) 40 MG tablet Take 1 tablet (40 mg total) by mouth daily. 12/21/17   Houston Siren, MD  promethazine (PHENERGAN) 12.5 MG tablet Take 12.5 mg by mouth every 6 (six) hours as needed for nausea/vomiting.    [provider]  spironolactone (ALDACTONE) 25 MG tablet Take 1 tablet (25 mg total) by mouth daily. 12/21/17   Houston Siren, MD  torsemide (DEMADEX) 20 MG tablet Take 2 tablets (40 mg total) by mouth daily. 12/21/17   Sainani, Rolly Pancake, MD  XIFAXAN 550 MG TABS tablet Take 550 mg by mouth 2 (two) times daily. 10/16/17   [provider]    Allergies Patient has no known allergies.    Social History Social History   Tobacco Use  . Smoking status: Never Smoker  . Smokeless tobacco: Never Used  Substance Use Topics  . Alcohol use: Not Currently  . Drug use: Not Currently    Review of Systems Patient denies headaches, rhinorrhea, blurry vision, numbness, shortness of breath, chest pain, edema, cough, abdominal pain, nausea, vomiting, diarrhea, dysuria, fevers, rashes or hallucinations unless otherwise stated above in HPI. ____________________________________________   PHYSICAL EXAM:  VITAL SIGNS: Vitals:   12/26/17 1448  BP: (!) 117/52  Pulse: (!) 110  Resp: (!) 21  Temp: 98.3 F (36.8 C)  SpO2: 98%    Constitutional:chronically ill appearing  but in no acute distress. Eyes: Conjunctivae are normal.  Scleral icterus  Head: Atraumatic. Nose: No congestion/rhinnorhea. Mouth/Throat: Mucous membranes are moist.   Neck: No stridor. Painless ROM.  Cardiovascular: Normal rate, regular rhythm. Grossly normal heart sounds.  Good peripheral circulation. Respiratory: Normal respiratory effort.  No retractions. Lungs CTAB. Gastrointestinal: obese Soft and nontender. No distention. No  abdominal bruits. No CVA tenderness. Genitourinary:  Musculoskeletal: No lower extremity tenderness BLE 2+ pitting edema.  No joint effusions. Neurologic:  + asterixis, no facial droop. MAE spontaenously  Skin:  Skin is warm, dry and intact.jaundiced appearing  ____________________________________________   LABS (all labs ordered are listed, but only abnormal results are displayed)  Results for orders placed or performed during the hospital encounter of 12/26/17 (from the past 24 hour(s))  CBC with Differential/Platelet     Status: Abnormal   Collection Time: 12/26/17  3:01 PM  Result Value Ref Range   WBC 8.5 3.8 - 10.6 K/uL   RBC 2.50 (L) 4.40 - 5.90 MIL/uL   Hemoglobin 9.0 (L) 13.0 - 18.0 g/dL   HCT 40.9 (L) 81.1 - 91.4 %   MCV 103.3 (H) 80.0 - 100.0 fL   MCH 36.0 (H) 26.0 - 34.0 pg   MCHC 34.9 32.0 - 36.0 g/dL   RDW 78.2 (H) 95.6 - 21.3 %   Platelets 139 (L) 150 - 440 K/uL   Neutrophils Relative % 78 %   Neutro Abs 6.6 (H) 1.4 - 6.5 K/uL   Lymphocytes Relative 9 %   Lymphs Abs 0.7 (L) 1.0 - 3.6 K/uL   Monocytes Relative 13 %   Monocytes Absolute 1.1 (H) 0.2 - 1.0 K/uL   Eosinophils Relative 0 %   Eosinophils Absolute 0.0 0 - 0.7 K/uL   Basophils Relative 0 %   Basophils Absolute 0.0 0 - 0.1 K/uL  Protime-INR     Status: Abnormal   Collection Time: 12/26/17  3:01 PM  Result Value Ref Range   Prothrombin Time 25.4 (H) 11.4 - 15.2 seconds   INR 2.33    ____________________________________________  EKG My review and personal interpretation at Time: 15:25   Indication: ams  Rate: 105  Rhythm: sinus Axis: normal Other: normal intervals, no normal ____________________________________________  RADIOLOGY  I personally reviewed all radiographic images ordered to evaluate for the above acute complaints and reviewed radiology reports and findings.  These findings were personally discussed with the patient.  Please see medical record for radiology  report.   ____________________________________________   PROCEDURES  Procedure(s) performed:  Procedures    Critical Care performed: no ____________________________________________   INITIAL IMPRESSION / ASSESSMENT AND PLAN / ED COURSE  Pertinent labs & imaging results that were available during my care of the patient were reviewed by me and considered in my medical decision making (see chart for details).  DDX: Dehydration, sepsis, pna, uti, hypoglycemia, cva, drug effect, withdrawal, encephalitis   Antonio York is a 57 y.o. who presents to the ED with altered mental status with elevation of ammonia levels from baseline.  Patient clearly worsening hepatic encephalopathy.  No evidence of infectious process.  Doubt SBP.  Anticipate elevation of bilirubin is secondary to worsening liver disease.  Ultrasound ordered to evaluate for obstructive pattern..  Patient will be given lactulose as well as rifaximin.  Patient will require hospitalization to ensure adequate lactulose titration as well as further medical management for clearance of his acute encephalopathy.  Have discussed with the patient and available family all diagnostics and treatments performed  thus far and all questions were answered to the best of my ability. The patient demonstrates understanding and agreement with plan.       As part of my medical decision making, I reviewed the following data within the electronic MEDICAL RECORD NUMBER Nursing notes reviewed and incorporated, Labs reviewed, notes from prior ED visits.   ____________________________________________   FINAL CLINICAL IMPRESSION(S) / ED DIAGNOSES  Final diagnoses:  Elevated bilirubin  Hepatic encephalopathy (HCC)      NEW MEDICATIONS STARTED DURING THIS VISIT:  New Prescriptions   No medications on file     Note:  This document was prepared using Dragon voice recognition software and may include unintentional dictation errors.    Willy Eddy, MD 12/26/17 1630

## 2017-12-26 NOTE — ED Notes (Signed)
Wife, Konrad Penta [phone]

## 2017-12-26 NOTE — ED Notes (Signed)
PT called out to be cleaned. Pt had bowel movement and was cleaned and sheets changed

## 2017-12-26 NOTE — ED Triage Notes (Signed)
Pt arrived from [place] [facility] with complaints of abdominal pain. Pt states "my stomach has been bothering my for 4 days." Pt denies any emesis or nausea. PT denies shortness of breath of chest pain.

## 2017-12-26 NOTE — ED Notes (Signed)
Floor unable to take report at this time.

## 2017-12-26 NOTE — ED Notes (Signed)
Patient transported to Ultrasound 

## 2017-12-27 ENCOUNTER — Inpatient Hospital Stay: Payer: Medicare Other

## 2017-12-27 DIAGNOSIS — L8990 Pressure ulcer of unspecified site, unspecified stage: Secondary | ICD-10-CM

## 2017-12-27 LAB — COMPREHENSIVE METABOLIC PANEL
ALBUMIN: 2.8 g/dL — AB (ref 3.5–5.0)
ALT: 39 U/L (ref 17–63)
ANION GAP: 8 (ref 5–15)
AST: 70 U/L — ABNORMAL HIGH (ref 15–41)
Alkaline Phosphatase: 101 U/L (ref 38–126)
BUN: 15 mg/dL (ref 6–20)
CALCIUM: 8.8 mg/dL — AB (ref 8.9–10.3)
CO2: 26 mmol/L (ref 22–32)
Chloride: 97 mmol/L — ABNORMAL LOW (ref 101–111)
Creatinine, Ser: 0.92 mg/dL (ref 0.61–1.24)
Glucose, Bld: 143 mg/dL — ABNORMAL HIGH (ref 65–99)
Potassium: 3.2 mmol/L — ABNORMAL LOW (ref 3.5–5.1)
SODIUM: 131 mmol/L — AB (ref 135–145)
TOTAL PROTEIN: 6.1 g/dL — AB (ref 6.5–8.1)
Total Bilirubin: 9.4 mg/dL — ABNORMAL HIGH (ref 0.3–1.2)

## 2017-12-27 LAB — CBC
HEMATOCRIT: 25.6 % — AB (ref 40.0–52.0)
Hemoglobin: 8.9 g/dL — ABNORMAL LOW (ref 13.0–18.0)
MCH: 36 pg — ABNORMAL HIGH (ref 26.0–34.0)
MCHC: 34.6 g/dL (ref 32.0–36.0)
MCV: 103.9 fL — ABNORMAL HIGH (ref 80.0–100.0)
PLATELETS: 119 10*3/uL — AB (ref 150–440)
RBC: 2.47 MIL/uL — ABNORMAL LOW (ref 4.40–5.90)
RDW: 17.2 % — AB (ref 11.5–14.5)
WBC: 9 10*3/uL (ref 3.8–10.6)

## 2017-12-27 LAB — AMMONIA: AMMONIA: 73 umol/L — AB (ref 9–35)

## 2017-12-27 MED ORDER — OXYCODONE HCL 5 MG PO TABS
5.0000 mg | ORAL_TABLET | Freq: Four times a day (QID) | ORAL | Status: DC | PRN
Start: 2017-12-27 — End: 2017-12-30
  Administered 2017-12-27 – 2017-12-30 (×5): 5 mg via ORAL
  Filled 2017-12-27 (×5): qty 1

## 2017-12-27 MED ORDER — METHYLPREDNISOLONE SODIUM SUCC 125 MG IJ SOLR
60.0000 mg | INTRAMUSCULAR | Status: DC
Start: 2017-12-27 — End: 2017-12-28
  Administered 2017-12-27 – 2017-12-28 (×2): 60 mg via INTRAVENOUS
  Filled 2017-12-27 (×2): qty 2

## 2017-12-27 MED ORDER — FUROSEMIDE 10 MG/ML IJ SOLN
40.0000 mg | Freq: Two times a day (BID) | INTRAMUSCULAR | Status: DC
  Administered 2017-12-27: 12:00:00 40 mg via INTRAVENOUS
  Filled 2017-12-27: qty 4

## 2017-12-27 MED ORDER — FUROSEMIDE 10 MG/ML IJ SOLN
40.0000 mg | Freq: Two times a day (BID) | INTRAMUSCULAR | Status: DC
  Administered 2017-12-28 – 2017-12-29 (×4): 40 mg via INTRAVENOUS
  Filled 2017-12-27 (×4): qty 4

## 2017-12-27 MED ORDER — BLISTEX MEDICATED EX OINT
TOPICAL_OINTMENT | CUTANEOUS | Status: DC | PRN
Start: 2017-12-27 — End: 2018-01-06
  Administered 2017-12-27: 08:00:00 via TOPICAL
  Filled 2017-12-27: qty 6.3

## 2017-12-27 MED ORDER — POTASSIUM CHLORIDE CRYS ER 20 MEQ PO TBCR
40.0000 meq | EXTENDED_RELEASE_TABLET | ORAL | Status: AC
  Administered 2017-12-27 (×2): 40 meq via ORAL
  Filled 2017-12-27 (×2): qty 2

## 2017-12-27 MED ORDER — IPRATROPIUM-ALBUTEROL 0.5-2.5 (3) MG/3ML IN SOLN
3.0000 mL | Freq: Four times a day (QID) | RESPIRATORY_TRACT | Status: DC
  Administered 2017-12-27 (×2): 3 mL via RESPIRATORY_TRACT
  Filled 2017-12-27 (×2): qty 3

## 2017-12-27 MED ORDER — IPRATROPIUM-ALBUTEROL 0.5-2.5 (3) MG/3ML IN SOLN
3.0000 mL | Freq: Three times a day (TID) | RESPIRATORY_TRACT | Status: DC
  Administered 2017-12-28 – 2017-12-29 (×3): 3 mL via RESPIRATORY_TRACT
  Filled 2017-12-27 (×4): qty 3

## 2017-12-27 MED ORDER — NYSTATIN 100000 UNIT/GM EX POWD
Freq: Two times a day (BID) | CUTANEOUS | Status: DC
  Administered 2017-12-27 – 2018-01-06 (×18): via TOPICAL
  Filled 2017-12-27: qty 15

## 2017-12-27 MED ORDER — GUAIFENESIN-DM 100-10 MG/5ML PO SYRP
5.0000 mL | ORAL_SOLUTION | ORAL | Status: DC | PRN
  Administered 2018-01-03: 5 mL via ORAL
  Filled 2017-12-27 (×2): qty 5

## 2017-12-27 NOTE — Progress Notes (Signed)
MD notified of increased drowsiness

## 2017-12-27 NOTE — NC FL2 (Signed)
[place] MEDICAID FL2 LEVEL OF CARE SCREENING TOOL     IDENTIFICATION  Patient Name: Antonio York Birthdate: 11/30/1960 Sex: male Admission Date (Current Location): 12/26/2017  [place] and [place] Number:  Chiropodist and Address:  [facility], [address] [place], [place] [address]      Provider Number: [id number]  Attending Physician Name and Address:  Milagros Loll, MD  Relative Name and Phone Number:       Current Level of Care: SNF Recommended Level of Care: Skilled Nursing Facility Prior Approval Number:    Date Approved/Denied:   PASRR Number: [id number]  Discharge Plan: SNF    Current Diagnoses: Patient Active Problem List   Diagnosis Date Noted  . Pressure injury of skin 12/27/2017  . Hepatic encephalopathy (HCC) 12/26/2017  . Hepatic cirrhosis (HCC) 12/18/2017  . Hyponatremia 12/14/2017    Orientation RESPIRATION BLADDER Height & Weight     Self, Time, Situation, Place  Normal Incontinent Weight: (!) 350 lb (158.8 kg) Height:   (167.6 cm)  BEHAVIORAL SYMPTOMS/MOOD NEUROLOGICAL BOWEL NUTRITION STATUS      Incontinent Diet(Low sodium 2g diet)  AMBULATORY STATUS COMMUNICATION OF NEEDS Skin   Extensive Assist Verbally PU Stage and Appropriate Care PU Stage 1 Dressing: (PRN dressing change)                     Personal Care Assistance Level of Assistance  Bathing, Feeding, Dressing, Total care Bathing Assistance: Limited assistance Feeding assistance: Independent Dressing Assistance: Limited assistance Total Care Assistance: Limited assistance   Functional Limitations Info  Sight, Hearing, Speech Sight Info: Adequate Hearing Info: Adequate Speech Info: Adequate    SPECIAL CARE FACTORS FREQUENCY  PT (By licensed PT), OT (By licensed OT)     PT Frequency: 5x a week OT Frequency: 5x a week            Contractures Contractures Info: Not present    Additional Factors Info  Code Status,  Allergies Code Status Info: DNR Allergies Info: NKA           Current Medications (12/27/2017):  This is the current hospital active medication list Current Facility-Administered Medications  Medication Dose Route Frequency Provider Last Rate Last Dose  . acetaminophen (TYLENOL) tablet 650 mg  650 mg Oral Q6H PRN Houston Siren, MD       Or  . acetaminophen (TYLENOL) suppository 650 mg  650 mg Rectal Q6H PRN Houston Siren, MD      . Melene Muller ON 12/28/2017] furosemide (LASIX) injection 40 mg  40 mg Intravenous BID Sudini, Srikar, MD      . guaiFENesin-dextromethorphan (ROBITUSSIN DM) 100-10 MG/5ML syrup 5 mL  5 mL Oral Q4H PRN Houston Siren, MD      . heparin injection 5,000 Units  5,000 Units Subcutaneous Q8H Houston Siren, MD   5,000 Units at 12/27/17 1251  . hydrocortisone cream 1 % 1 application  1 application Topical Q6H PRN Houston Siren, MD      . ipratropium-albuterol (DUONEB) 0.5-2.5 (3) MG/3ML nebulizer solution 3 mL  3 mL Nebulization Q6H Sudini, Srikar, MD   3 mL at 12/27/17 1320  . lactulose (CHRONULAC) 10 GM/15ML solution 30 g  30 g Oral TID Houston Siren, MD   30 g at 12/27/17 1512  . lip balm (BLISTEX) ointment   Topical PRN Cammy Copa, MD      . MEDLINE mouth rinse  15 mL Mouth Rinse BID  Houston Siren, MD   15 mL at 12/27/17 1610  . methylPREDNISolone sodium succinate (SOLU-MEDROL) 125 mg/2 mL injection 60 mg  60 mg Intravenous Q24H Milagros Loll, MD   60 mg at 12/27/17 1129  . midodrine (PROAMATINE) tablet 10 mg  10 mg Oral TID WC Houston Siren, MD   10 mg at 12/27/17 1512  . nystatin (MYCOSTATIN/NYSTOP) topical powder   Topical BID Milagros Loll, MD      . ondansetron [facility]) tablet 4 mg  4 mg Oral Q6H PRN Houston Siren, MD       Or  . ondansetron (ZOFRAN) injection 4 mg  4 mg Intravenous Q6H PRN Houston Siren, MD      . oxyCODONE (Oxy IR/ROXICODONE) immediate release tablet 5 mg  5 mg Oral Q6H PRN Barbaraann Rondo, MD   5 mg at 12/27/17  0232  . pantoprazole (PROTONIX) EC tablet 40 mg  40 mg Oral Daily Houston Siren, MD   40 mg at 12/27/17 0807  . rifaximin (XIFAXAN) tablet 550 mg  550 mg Oral BID Willy Eddy, MD   550 mg at 12/27/17 0806  . spironolactone (ALDACTONE) tablet 25 mg  25 mg Oral Daily Houston Siren, MD   25 mg at 12/27/17 9604     Discharge Medications: Please see discharge summary for a list of discharge medications.  Relevant Imaging Results:  Relevant Lab Results:   Additional Information SSN [id number]  Darleene Cleaver, [place]

## 2017-12-27 NOTE — Progress Notes (Signed)
Pt c/o pain. On-call MD paged due to patient only having tylenol ordered and needing something different since patient has cirrhosis. Awaiting call back.

## 2017-12-27 NOTE — Progress Notes (Signed)
[employer] [facility] - [place] at [facility]   PATIENT NAME: Antonio York    MR#:  [id number]  DATE OF BIRTH:  10-21-1960  SUBJECTIVE:  CHIEF COMPLAINT:   Chief Complaint  Patient presents with  . Abdominal Pain   patient still confused. Seems visibly short of breath and coughing.  REVIEW OF SYSTEMS:    Review of Systems  Unable to perform ROS: Mental status change    DRUG ALLERGIES:  No Known Allergies  VITALS:  Blood pressure (!) 133/50, pulse (!) 108, temperature 99 F (37.2 C), temperature source Oral, resp. rate 19, height  (1.676 m), weight (!) 158.8 kg (350 lb), SpO2 95 %.  PHYSICAL EXAMINATION:   Physical Exam  GENERAL:  57 y.o.-year-old patient lying in the bed , mild respiratory distress EYES: Pupils equal, round, reactive to light and accommodation. No scleral icterus. Extraocular muscles intact.  HEENT: Head atraumatic, normocephalic. Oropharynx and nasopharynx clear.  NECK:  Supple, no jugular venous distention. No thyroid enlargement, no tenderness.  LUNGS: bilateral wheezing and coarse crackles CARDIOVASCULAR: S1, S2 normal. No murmurs, rubs, or gallops.  ABDOMEN: Soft, nontender, nondistended. Bowel sounds present. No organomegaly or mass.  EXTREMITIES: No cyanosis, clubbing or edema b/l.    NEUROLOGIC: Cranial nerves II through XII are intact. No focal Motor or sensory deficits b/l.   PSYCHIATRIC: The patient is drowsy SKIN: No obvious rash, lesion, or ulcer.   LABORATORY PANEL:   CBC Recent Labs  Lab 12/27/17 0906  WBC 9.0  HGB 8.9*  HCT 25.6*  PLT 119*   ------------------------------------------------------------------------------------------------------------------ Chemistries  Recent Labs  Lab 12/27/17 0906  NA 131*  K 3.2*  CL 97*  CO2 26  GLUCOSE 143*  BUN 15  CREATININE 0.92  CALCIUM 8.8*  AST 70*  ALT 39  ALKPHOS 101  BILITOT 9.4*    ------------------------------------------------------------------------------------------------------------------  Cardiac Enzymes No results for input(s): TROPONINI in the last 168 hours. ------------------------------------------------------------------------------------------------------------------  RADIOLOGY:  Dg Chest 2 View  Result Date: 12/27/2017 CLINICAL DATA:  Wheezing EXAM: CHEST - 2 VIEW COMPARISON:  12/14/2017 FINDINGS: Cardiac shadow is stable. The lungs are well aerated bilaterally. Right chest wall port is noted with catheter tip in the mid superior vena cava. No focal infiltrate or effusion is seen. No bony abnormality is noted. IMPRESSION: No acute abnormality noted. Electronically Signed   By: Alcide Clever M.D.   On: 12/27/2017 11:12   US Abdomen Limited Ruq  Result Date: 12/26/2017 CLINICAL DATA:  Elevated bilirubin EXAM: ULTRASOUND ABDOMEN LIMITED RIGHT UPPER QUADRANT COMPARISON:  None. FINDINGS: Gallbladder: The gallbladder has previously been resected Common bile duct: Diameter: There is a large amount of bowel gas and with the patient's body habitus, assessment of the common bile duct is difficult. The best measurement shows a common bile duct measures 4.8 mm in diameter which is within normal limits. Liver: The liver also is somewhat poorly visualized. The liver appears echogenic and inhomogeneous consistent with hepatic steatosis. No focal hepatic abnormality is seen. Portal vein is patent on color Doppler imaging with normal direction of blood flow towards the liver. IMPRESSION: 1. Echogenic and inhomogeneous liver parenchyma most consistent with hepatic steatosis. No focal abnormality. 2. Prior cholecystectomy. Electronically Signed   By: Dwyane Dee M.D.   On: 12/26/2017 16:42     ASSESSMENT AND PLAN:   57 year old male with past medical history of nonalcoholic fatty liver disease, liver cirrhosis, history of hyponatremia, recent admission for altered mental  status secondary to encephalopathy/hyponatremia, history of  hepatic encephalopathy, COPD, CHF  presened to the hospital due to altered mental status.  * Shortness of breath patient given standard dose of Lasix, donate and Solu-Medrol. His crackles and wheezing on exam. Stat chest x-ray was ordered which shows no pulmonary edema. But he does have significant lower extremity edema and anasarca. Oxygen as needed.  * Acute hepatic encephalopathy Patient apparently was not getting his lactulose at the skilled nursing facility as he supposed to. Continue lactulose. Monitor mental status. Rifaximin.  * Chronic hypotension secondary to chronic liver disease continue midodrine.  * History of chronic diastolic CHF-continue torsemide, Aldactone.  * GERD-continue Protonix.  All the records are reviewed and case discussed with Care Management/Social Worker Management plans discussed with the patient, family and they are in agreement.  CODE STATUS: FULL CODE  DVT Prophylaxis: SCDs  TOTAL CC TIME TAKING CARE OF THIS PATIENT: 35 minutes.   POSSIBLE D/C IN 1-2 DAYS, DEPENDING ON CLINICAL CONDITION.  Molinda Bailiff Candid Bovey M.D on 12/27/2017 at 12:57 PM  Between 7am to 6pm - Pager - [phone]  After 6pm go to [email or web address] - password [email or web address] [facility]  [facility]  Office  [phone]  CC: Primary care physician; Dimple Casey, MD  Note: This dictation was prepared with Dragon dictation along with smaller phrase technology. Any transcriptional errors that result from this process are unintentional.

## 2017-12-27 NOTE — Progress Notes (Addendum)
MD notified of Left abdomen greater than right, moisture associated skin damage with open areas to abdomen. See new orders.  Kinnie Scales, LPN

## 2017-12-27 NOTE — Clinical Social Work Note (Signed)
CSW was informed that patient is from [facility] as a short term rehab patient.  CSW attempted to see patient but he was sleeping and did not wake up, will try at a later time.  2:30pm  CSW attempted to see patient again to complete assessment, but he did not want to wake up.  4:15pm  CSW attempted again to see patient to complete assessment and he was still sleeping, will try to complete assessment at a later time.  Ervin Knack. Nayda Riesen, MSW, Theresia Majors [phone]  12/27/2017 6:25 PM

## 2017-12-28 ENCOUNTER — Inpatient Hospital Stay: Payer: Medicare Other

## 2017-12-28 LAB — BASIC METABOLIC PANEL
ANION GAP: 8 (ref 5–15)
Anion gap: 8 (ref 5–15)
BUN: 19 mg/dL (ref 6–20)
BUN: 22 mg/dL — AB (ref 6–20)
CALCIUM: 8.3 mg/dL — AB (ref 8.9–10.3)
CHLORIDE: 94 mmol/L — AB (ref 101–111)
CO2: 24 mmol/L (ref 22–32)
CO2: 23 mmol/L (ref 22–32)
CREATININE: 1.18 mg/dL (ref 0.61–1.24)
Calcium: 8.3 mg/dL — ABNORMAL LOW (ref 8.9–10.3)
Chloride: 93 mmol/L — ABNORMAL LOW (ref 101–111)
Creatinine, Ser: 1.16 mg/dL (ref 0.61–1.24)
GFR calc Af Amer: >60 mL/min (ref >60)
GFR calc non Af Amer: >60 mL/min (ref >60)
GFR calc non Af Amer: >60 mL/min (ref >60)
GLUCOSE: 155 mg/dL — AB (ref 65–99)
Glucose, Bld: 131 mg/dL — ABNORMAL HIGH (ref 65–99)
Potassium: 4.5 mmol/L (ref 3.5–5.1)
Potassium: 4.2 mmol/L (ref 3.5–5.1)
SODIUM: 126 mmol/L — AB (ref 135–145)
Sodium: 124 mmol/L — ABNORMAL LOW (ref 135–145)

## 2017-12-28 LAB — CBC WITH DIFFERENTIAL/PLATELET
BASOS ABS: 0 10*3/uL (ref 0–0.1)
BASOS PCT: 0 %
Eosinophils Absolute: 0 10*3/uL (ref 0–0.7)
Eosinophils Relative: 0 %
HEMATOCRIT: 24.3 % — AB (ref 40.0–52.0)
Hemoglobin: 8.3 g/dL — ABNORMAL LOW (ref 13.0–18.0)
Lymphocytes Relative: 6 %
Lymphs Abs: 0.6 10*3/uL — ABNORMAL LOW (ref 1.0–3.6)
MCH: 35.4 pg — ABNORMAL HIGH (ref 26.0–34.0)
MCHC: 34.4 g/dL (ref 32.0–36.0)
MCV: 103.1 fL — ABNORMAL HIGH (ref 80.0–100.0)
MONO ABS: 0.7 10*3/uL (ref 0.2–1.0)
MONOS PCT: 6 %
Neutro Abs: 9.8 10*3/uL — ABNORMAL HIGH (ref 1.4–6.5)
Neutrophils Relative %: 88 %
PLATELETS: 116 10*3/uL — AB (ref 150–440)
RBC: 2.35 MIL/uL — ABNORMAL LOW (ref 4.40–5.90)
RDW: 17.2 % — ABNORMAL HIGH (ref 11.5–14.5)
WBC: 11.1 10*3/uL — ABNORMAL HIGH (ref 3.8–10.6)

## 2017-12-28 MED ORDER — PREDNISONE 50 MG PO TABS
50.0000 mg | ORAL_TABLET | Freq: Every day | ORAL | Status: DC
  Administered 2017-12-29 – 2018-01-06 (×9): 50 mg via ORAL
  Filled 2017-12-28 (×9): qty 1

## 2017-12-28 MED ORDER — TOLVAPTAN 15 MG PO TABS
15.0000 mg | ORAL_TABLET | Freq: Once | ORAL | Status: DC
  Filled 2017-12-28: qty 1

## 2017-12-28 NOTE — Progress Notes (Addendum)
Pt requested to speak to this nurse . Upon entering pt room he told this nurse that he wanted to leave. I informed him that the MD did not have and D/C orders for him. He asked that I contact MD to let her know. MD contacted. She stated she would not be D/Cing him. Pt was informed of this , but stated he "wants to leave anyway." He said "I will sign one of those papers saying I am relieving you all of my care." This nurse talked with pt about AMA and what it entails, that we would not help with transportation, and that EMS would not pick him up for transport without D/C orders. Pt said he understood and that he had a brother that would pick him up. This nurse stated that she would need to speak to brother to make sure he understood pt would be leaving AMA and would need help getting dressed, in wheelchair, and into the car. Pt handed this nurse his personal phone after dialing his brother Orvilla Fus. Tommy stated "I cannot help him get dressed or any of that. I have been helping him for years, and my back is bad. My doctor told me I could become paralyzed if I keep pushing and pulling him." He also stated "His wife is disabled and unable to care for him, and the lady he wants to pick him up is wheelchair bound herself." I agreed with pt brother that it was not appropriate for them to get him, and that pt needs to stay.   At approximately 1745 I received a call from [facility] stating that they received a call from [facility] stating that pt had called them wanting to be picked up from the hospital and taken home. [place] contacted [facility] because it was out of district. [facility] called this nurse to clarify because it sounded off to them. I clarified pt was not leaving. I spoke with pt again about EMS and AMA guidelines.    @ approximately 1930 this nurse and oncoming night nurse entered room to give handout report. Upon entering room pt was dressed, in wheelchair, with two brothers, and two nephews in  the room ready to take him home. This nurse, and RN Cherylynn Ridges tried to talk pt out of leaving, but he is adamant. He is willing to let RN remove staples if an order can be received, but refuses to stay past that.

## 2017-12-28 NOTE — Evaluation (Signed)
Physical Therapy Evaluation Patient Details Name: Antonio York MRN: [id number] DOB: Jan 01, 1961 Today's Date: 12/28/2017   History of Present Illness  57 yo male with 3rd admission this month has new AMS with encephalopathy of hepatic origin due to not receiving lactulose in SNF.  Recent fall with sutured LLE laceration, low Na+  and disuse atrophy of mm's.  PMHx:  encephalopathy, NASH cirrhosis, CP, CHF, morbid obesity, asthma, COPD, hemochromatosis  Clinical Impression  Pt was seen for evaluation of mobility and strength, and noted dependent nature of transfers and bed mobility.  He is quite weak and has anasarca with significant LE edema.  Anticipate continued therapy to increase his tolerance for activity, and will progress with mobility to sit and stand up as able.  Follow acutely for same goals.    Follow Up Recommendations SNF    Equipment Recommendations  None recommended by PT    Recommendations for Other Services       Precautions / Restrictions Precautions Precautions: Fall Precaution Comments: recent syncopal episode Restrictions Weight Bearing Restrictions: No Other Position/Activity Restrictions: painful with LLE laceration      Mobility  Bed Mobility Overal bed mobility: Needs Assistance Bed Mobility: Supine to Sit;Sit to Supine     Supine to sit: Mod assist Sit to supine: Max assist   General bed mobility comments: used bed rails and bed pad to scoot up in bed  Transfers Overall transfer level: Needs assistance Equipment used: Rolling walker (2 wheeled);2 person hand held assist Transfers: Sit to/from Stand Sit to Stand: Max assist;+2 physical assistance;+2 safety/equipment;From elevated surface            Ambulation/Gait             General Gait Details: cannot get pt to stand well enough to attempt  Administrator mobility: (pt reports he used wc at baseline at home)  Modified  Rankin (Stroke Patients Only)       Balance Overall balance assessment: Needs assistance;History of Falls Sitting-balance support: Feet supported;Bilateral upper extremity supported Sitting balance-Leahy Scale: Fair     Standing balance support: Bilateral upper extremity supported;During functional activity Standing balance-Leahy Scale: Zero                               Pertinent Vitals/Pain Pain Assessment: Faces Faces Pain Scale: Hurts even more Pain Location: LLE laceration Pain Descriptors / Indicators: Operative site guarding Pain Intervention(s): Limited activity within patient's tolerance;Monitored during session;Repositioned    Home Living Family/patient expects to be discharged to:: Skilled nursing facility                 Additional Comments: pt was using hoyer and walking short trips on RW at SNF    Prior Function Level of Independence: Needs assistance   Gait / Transfers Assistance Needed: RW for short trips,   ADL's / Homemaking Assistance Needed: brothers come by to assist pt and his wife is disabled        Hand Dominance   Dominant Hand: Right    Extremity/Trunk Assessment   Upper Extremity Assessment Upper Extremity Assessment: Generalized weakness    Lower Extremity Assessment Lower Extremity Assessment: Generalized weakness    Cervical / Trunk Assessment Cervical / Trunk Assessment: Normal  Communication   Communication: No difficulties  Cognition Arousal/Alertness: Awake/alert Behavior During Therapy: WFL for tasks assessed/performed Overall Cognitive  Status: No family/caregiver present to determine baseline cognitive functioning                                 General Comments: requires instructions to be repeated including expectations for therapy session      General Comments      Exercises     Assessment/Plan    PT Assessment Patient needs continued PT services  PT Problem List Decreased  strength;Decreased range of motion;Decreased activity tolerance;Decreased balance;Decreased mobility;Decreased coordination;Decreased cognition;Decreased knowledge of use of DME;Decreased safety awareness;Obesity;Decreased skin integrity;Pain       PT Treatment Interventions DME instruction;Gait training;Functional mobility training;Therapeutic activities;Therapeutic exercise;Balance training;Neuromuscular re-education;Patient/family education    PT Goals (Current goals can be found in the Care Plan section)  Acute Rehab PT Goals Patient Stated Goal: walk again and go home PT Goal Formulation: With patient Time For Goal Achievement: 01/11/18 Potential to Achieve Goals: Fair    Frequency Min 2X/week   Barriers to discharge Inaccessible home environment;Decreased caregiver support home with disabled wife    Co-evaluation               AM-PAC PT "6 Clicks" Daily Activity  Outcome Measure Difficulty turning over in bed (including adjusting bedclothes, sheets and blankets)?: Unable Difficulty moving from lying on back to sitting on the side of the bed? : Unable Difficulty sitting down on and standing up from a chair with arms (e.g., wheelchair, bedside commode, etc,.)?: Unable Help needed moving to and from a bed to chair (including a wheelchair)?: Total Help needed walking in hospital room?: Total Help needed climbing 3-5 steps with a railing? : Total 6 Click Score: 6    End of Session   Activity Tolerance: Patient limited by fatigue;Other (comment)(LE strength, cognition) Patient left: in bed;with call bell/phone within reach;with nursing/sitter in room Nurse Communication: Mobility status PT Visit Diagnosis: Unsteadiness on feet (R26.81);Other abnormalities of gait and mobility (R26.89);Muscle weakness (generalized) (M62.81);History of falling (Z91.81);Pain Pain - Right/Left: Left Pain - part of body: Leg    Time: 1203-1233 PT Time Calculation (min) (ACUTE ONLY): 30  min   Charges:   PT Evaluation $PT Eval Moderate Complexity: 1 Mod PT Treatments $Therapeutic Activity: 8-22 mins   PT G Codes:   PT G-Codes **NOT FOR INPATIENT CLASS** Functional Assessment Tool Used: AM-PAC 6 Clicks Basic Mobility    Ivar Drape 12/28/2017, 12:57 PM   Samul Dada, PT MS Acute Rehab Dept. Number: [facility] [id number] and [facility] [phone]

## 2017-12-28 NOTE — Progress Notes (Signed)
[employer] - [place] at [facility]   PATIENT NAME: Antonio York    MR#:  [id number]  DATE OF BIRTH:  [birth year]  SUBJECTIVE: Patient is alert, awake, oriented, admitted for hepatic encephalopathy with ammonia more than 100 on admission.  No ammonia is down to 70.  He is alert and oriented, wants to go home and have physical therapy every day.  Patient is from [place] [facility] care  CHIEF COMPLAINT:   Chief Complaint  Patient presents with  . Abdominal Pain   No cough or shortness of breath no wheezing.  REVIEW OF SYSTEMS:  , Awake, oriented, Cardiovascular no chest pain Pulmonary no shortness of breath. Neurological patient alert, awake, oriented GI abdomen is soft and denies any abdominal pain.   DRUG ALLERGIES:  No Known Allergies  VITALS:  Blood pressure 93/65, pulse (!) 108, temperature 98.5 F (36.9 C), temperature source Oral, resp. rate 19, height  (1.676 m), weight (!) 158.8 kg (350 lb), SpO2 96 %.  PHYSICAL EXAMINATION:   Physical Exam  GENERAL:  56 y.o.-year-old patient lying in the bed , mild respiratory distress EYES: Pupils equal, round, reactive to light and accommodation.  Jaundiced extraocular muscles intact.  HEENT: Head atraumatic, normocephalic. Oropharynx and nasopharynx clear.  NECK:  Supple, no jugular venous distention. No thyroid enlargement, no tenderness.  LUNGS: no wheezing today. CARDIOVASCULAR: S1, S2 normal. No murmurs, rubs, or gallops.  ABDOMEN: Soft, nontender, nondistended. Bowel sounds present. No organomegaly or mass.  EXTREMITIES: No cyanosis, clubbing or edema patient has bilateral Unna boots.    NEUROLOGIC: Cranial nerves II through XII are intact. No focal Motor or sensory deficits b/l.   PSYCHIATRIC:  alert, awake, oriented.  Eating well. SKIN: No obvious rash, lesion, or ulcer.  Appears jaundiced. LABORATORY PANEL:   CBC Recent Labs  Lab 12/28/17 0511  WBC 11.1*  HGB 8.3*  HCT 24.3*  PLT 116*    ------------------------------------------------------------------------------------------------------------------ Chemistries  Recent Labs  Lab 12/27/17 0906 12/28/17 0511  NA 131* 126*  K 3.2* 4.5  CL 97* 94*  CO2 26 24  GLUCOSE 143* 131*  BUN 15 19  CREATININE 0.92 1.16  CALCIUM 8.8* 8.3*  AST 70*  --   ALT 39  --   ALKPHOS 101  --   BILITOT 9.4*  --    ------------------------------------------------------------------------------------------------------------------  Cardiac Enzymes No results for input(s): TROPONINI in the last 168 hours. ------------------------------------------------------------------------------------------------------------------  RADIOLOGY:  Dg Chest 2 View  Result Date: 12/27/2017 CLINICAL DATA:  Wheezing EXAM: CHEST - 2 VIEW COMPARISON:  12/14/2017 FINDINGS: Cardiac shadow is stable. The lungs are well aerated bilaterally. Right chest wall port is noted with catheter tip in the mid superior vena cava. No focal infiltrate or effusion is seen. No bony abnormality is noted. IMPRESSION: No acute abnormality noted. Electronically Signed   By: Alcide Clever M.D.   On: 12/27/2017 11:12   US Abdomen Limited Ruq  Result Date: 12/26/2017 CLINICAL DATA:  Elevated bilirubin EXAM: ULTRASOUND ABDOMEN LIMITED RIGHT UPPER QUADRANT COMPARISON:  None. FINDINGS: Gallbladder: The gallbladder has previously been resected Common bile duct: Diameter: There is a large amount of bowel gas and with the patient's body habitus, assessment of the common bile duct is difficult. The best measurement shows a common bile duct measures 4.8 mm in diameter which is within normal limits. Liver: The liver also is somewhat poorly visualized. The liver appears echogenic and inhomogeneous consistent with hepatic steatosis. No focal hepatic abnormality is seen. Portal vein is patent  on color Doppler imaging with normal direction of blood flow towards the liver. IMPRESSION: 1. Echogenic and  inhomogeneous liver parenchyma most consistent with hepatic steatosis. No focal abnormality. 2. Prior cholecystectomy. Electronically Signed   By: Dwyane Dee M.D.   On: 12/26/2017 16:42     ASSESSMENT AND PLAN:   57 year old male with past medical history of nonalcoholic fatty liver disease, liver cirrhosis, history of hyponatremia, recent admission for altered mental status secondary to encephalopathy/hyponatremia, history of hepatic encephalopathy, COPD, CHF  presened to the hospital due to altered mental status.  * Shortness of breath Improved, continue Lasix, change IV Solu-Medrol to prednisone.  * Acute hepatic encephalopathy Patient apparently was not getting his lactulose at the skilled nursing facility as he supposed to. Continue lactulose.  Mental status improved, ammonia level is also decreased to 73. * Chronic hypotension secondary to chronic liver disease continue midodrine.  * History of chronic diastolic CHF-continue t Aldactone.,  Now has hyponatremia with sodium 126 this morning, recheck sodium this afternoon, continue Lasix  * GERD-continue Protonix. Liver cirrhosis, due to an NASH, hereditary hemochromatosis, by [facility]  liver clinic also received phlebotomy before.  Worsening jaundice, patient bilirubin is 9.3, looking at his previous labs patient bilirubin is around 6.4.  Consult gastroenterology.   all the records are reviewed and case discussed with Care Management/Social Worker Management plans discussed with the patient, family and they are in agreement.  CODE STATUS: FULL CODE  DVT Prophylaxis: SCDs  TOTAL CC TIME TAKING CARE OF THIS PATIENT: 35 minutes.   POSSIBLE D/C IN 1-2 DAYS, DEPENDING ON CLINICAL CONDITION.  Katha Hamming M.D on 12/28/2017 at 10:55 AM  Between 7am to 6pm - Pager - [phone]  After 6pm go to [email or web address] - password [email or web address] [facility]  [facility] [place] [facility]  Office  [phone]  CC: Primary care physician; Dimple Casey, MD  Note: This dictation was prepared with Dragon dictation along with smaller phrase technology. Any transcriptional errors that result from this process are unintentional.

## 2017-12-28 NOTE — Clinical Social Work Note (Signed)
Clinical Social Work Assessment  Patient Details  Name: Antonio York MRN: [id number] Date of Birth: [birth year]  Date of referral:  12/28/17               Reason for consult:  Facility Placement                Permission sought to share information with:  Chartered certified accountant granted to share information::  Yes, Verbal Permission Granted  Name::        Agency::  [facility], [place] area SNFs, [place] area SNFs  Relationship::     Contact Information:     Housing/Transportation Living arrangements for the past 2 months:  [place] of Information:  Patient, Scientist, water quality, Spouse Patient Interpreter Needed:  None Criminal Activity/Legal Involvement Pertinent to Current Situation/Hospitalization:  No - Comment as needed Significant Relationships:  Adult Children, Siblings Lives with:  Spouse Do you feel safe going back to the place where you live?  Yes Need for family participation in patient care:  Yes (Comment)(Patient has significantly limited insight regarding his needs. Patient may not be capable of making medical decisions.)  Care giving concerns:  Patient admitted from [facility]   Social Worker assessment / plan:  The CSW met with the patient at bedside to discuss discharge planning. The patient stated that he refuses to go to any SNF and has taken it upon himself to arrange home health services. The CSW explained to the patient that home health services would require a medical practitioner's orders. The patient became confused and asked the CSW to give him his composition book. The patient was not able to find the name of the agency. The CSW explained that PT would evaluate the patient. The patient indicated that the CSW should call his wife.  The CSW phoned the patient's wife who was tearful throughout the first conversation. The patient's wife shared that she misses her husband; however, she is  unable to care for him due to his mobility limitations. The patient's wife asked that the CSW attempt to locate a SNF closer to their home in [place]. The CSW sent requests to [facility] as well as [facility] is willing to have the patient return.  PT recommended SNF. The CSW visited the patient who continued to adamantly refuse any SNF, regardless of location. The CSW contacted the patient's wife to update her, and she was adamant that she cannot care for the patient. The CSW explained the patient's right to choose his medical care unless deemed unable to make medical decisions. The CSW explained that the recommendation at this time is for a psychiatric consult to assess for capacity. The CSW explained to the patient's wife that if the patient is deemed incapable at this time of making decisions, then we can defer to her decision. Until that time, the patient has the right to make his own decisions.  The CSW has advised the medical team of the issue. The patient does show signs of intermittent confusion, and he has highly limited insight into his abilities. He believes that he can ambulate to and from a bathroom (which is what his wife needs him to do in order to return home). However, he was unable to even lift his feet with PT. CSW will continue to follow for continued discharge planning.  Employment status:  Retired Nurse, adult PT Recommendations:  [place] /  Referral to community resources:  [place]  Patient/Family's Response to care:  The patient and his family are not in agreement about discharge planning.  Patient/Family's Understanding of and Emotional Response to Diagnosis, Current Treatment, and Prognosis:  The family understands that the patient needs a higher level of care. However, the patient's insight is limited, and he believes that he can return home safely.  Emotional  Assessment Appearance:  Appears stated age Attitude/Demeanor/Rapport:  Angry, Inconsistent Affect (typically observed):  Defensive, Agitated Orientation:  Oriented to Self, Oriented to Place, Oriented to  Time Alcohol / Substance use:  Never Used Psych involvement (Current and /or in the community):  Yes (Comment)(Recommendation for psychiatric consult for capacity)  Discharge Needs  Concerns to be addressed:  Adjustment to Illness, Discharge Planning Concerns, Decision making concerns, Care Coordination Readmission within the last 30 days:  Yes Current discharge risk:  Chronically ill Barriers to Discharge:  Continued Medical Work up   [employer], LCSW 12/28/2017, 3:06 PM

## 2017-12-28 NOTE — Progress Notes (Signed)
Spoke to patient's wife Mindi Junker, requested physical therapy evaluation, she said patient does not like [facility].  She wanted to see if patient continues to be transferred I told her that patient medical problems are chronic and we are getting gastroenterology opinion for worsening jaundice, told her that there is no indication to transfer the patient to [facility] at this time as there is no need for him to get extra services than we can provide.  Patient is alert, awake, oriented today.  Wife also concerned about changes in his leg that they removed or not.  I will talk to patient's nurse.

## 2017-12-28 NOTE — Consult Note (Signed)
WOC Nurse wound consult note Reason for Consult: Moisture associated skin damage, specifically IAD and ITD at the sub pannus and bilateral inguinal folds. Also traumatic skin tear to left LE with edges approximated by staples, but open area in center that is red, moist and clean. Will need order for nursing staff to remove staples.  If you agree, please Order. Wound type: Moisture and trauma Pressure Injury POA: NA Measurement: LLE with  0.6cm x 15cm x 0.1cm laceration to left lateral LE Wound bed:red, moist Drainage (amount, consistency, odor) scant serous Periwound:with hemosiderin staining  Sub pannicular and bilateral inguinal skin folds with dermatitis and scattered pinpoint open areas with serous exudate. Macerated periwound tissues.   Dressing procedure/placement/frequency: I have provided a mattress replacement for microclimate management and guidance via the orders for care of the intertriginous dermatitis using our house antimicrobial wicking textile. Topical care for the skin tear using xeroform gauze with mild compression using ACE bandages is recommended. Patient with urinary incontinence and retracted penis, staff instructed that PurWick device is not to be used on male patients.  WOC nursing team will not follow, but will remain available to this patient, the nursing and medical teams.  Please re-consult if needed. Thanks, Ladona Mow, MSN, RN, GNP, [place]  Pager# [phone]

## 2017-12-28 NOTE — Progress Notes (Signed)
At 1930, this nurse was receiving report from the off-going nurse Morrie Sheldon) at the bedside. Patient was in his regular clothes and personal wheelchair with family at bedside. When patient was asked what was he doing, patient informed this RN that he was going home. This RN and off-going RN explained to patient the many benefits of staying and continuing care while also explaining the risks of leaving Against Medical Advice (AMA). Patient family member (brother) had also discussed with patient the benefits of staying and continuing treatment. Patient verbalized understanding of the benefits of staying and continuing treatment as well as the risks involve with leaving AMA, but was adament about wanting to leave. This RN asked that patient allow her to get an order to remove staples from left leg, and discontinue saline locked IV prior to patient leaving, and he agreed. Orders received to remove staples and IV, staples and IV removed, site cleansed with benzoin, Mepitel, gauze and abd pad were applied and reinforced with gauze wrap. Patient was given AMA paper work and paperwork was reviewed with patient. Patient verbalized understanding of AMA paper work and signed paperwork with this RN as the witness. Patient left with family and states that he will be going home (to his private residence).

## 2017-12-28 NOTE — Progress Notes (Signed)
Spoke with Dr. Maximino Greenland from gastroenterology recommended MRCP.  I will order MRCP, she will follow the results and make further recommendation.  Order ferritin level, iron level also to his blood work.  May be worsening of his liver disease rather than acute problem.

## 2017-12-29 ENCOUNTER — Inpatient Hospital Stay: Payer: Medicare Other

## 2017-12-29 DIAGNOSIS — K7581 Nonalcoholic steatohepatitis (NASH): Secondary | ICD-10-CM

## 2017-12-29 DIAGNOSIS — R17 Unspecified jaundice: Secondary | ICD-10-CM

## 2017-12-29 DIAGNOSIS — F4329 Adjustment disorder with other symptoms: Secondary | ICD-10-CM

## 2017-12-29 LAB — COMPREHENSIVE METABOLIC PANEL
ALBUMIN: 2.8 g/dL — AB (ref 3.5–5.0)
ALK PHOS: 101 U/L (ref 38–126)
ALT: 45 U/L (ref 17–63)
ANION GAP: 10 (ref 5–15)
AST: 82 U/L — ABNORMAL HIGH (ref 15–41)
BUN: 27 mg/dL — ABNORMAL HIGH (ref 6–20)
CALCIUM: 8.6 mg/dL — AB (ref 8.9–10.3)
CO2: 21 mmol/L — AB (ref 22–32)
CREATININE: 1.29 mg/dL — AB (ref 0.61–1.24)
Chloride: 91 mmol/L — ABNORMAL LOW (ref 101–111)
GFR calc Af Amer: >60 mL/min (ref >60)
GFR calc non Af Amer: >60 mL/min (ref >60)
GLUCOSE: 114 mg/dL — AB (ref 65–99)
Potassium: 4.4 mmol/L (ref 3.5–5.1)
SODIUM: 122 mmol/L — AB (ref 135–145)
Total Bilirubin: 6.8 mg/dL — ABNORMAL HIGH (ref 0.3–1.2)
Total Protein: 6.1 g/dL — ABNORMAL LOW (ref 6.5–8.1)

## 2017-12-29 LAB — CBC
HCT: 23.6 % — ABNORMAL LOW (ref 40.0–52.0)
HEMOGLOBIN: 8.1 g/dL — AB (ref 13.0–18.0)
MCH: 35.5 pg — AB (ref 26.0–34.0)
MCHC: 34.2 g/dL (ref 32.0–36.0)
MCV: 103.9 fL — ABNORMAL HIGH (ref 80.0–100.0)
Platelets: 139 10*3/uL — ABNORMAL LOW (ref 150–440)
RBC: 2.27 MIL/uL — AB (ref 4.40–5.90)
RDW: 17.1 % — ABNORMAL HIGH (ref 11.5–14.5)
WBC: 15.8 10*3/uL — ABNORMAL HIGH (ref 3.8–10.6)

## 2017-12-29 LAB — SODIUM: Sodium: 124 mmol/L — ABNORMAL LOW (ref 135–145)

## 2017-12-29 LAB — BILIRUBIN, DIRECT: BILIRUBIN DIRECT: 2.2 mg/dL — AB (ref 0.1–0.5)

## 2017-12-29 MED ORDER — ALBUMIN HUMAN 25 % IV SOLN
12.5000 g | Freq: Three times a day (TID) | INTRAVENOUS | Status: DC
  Administered 2017-12-29 – 2018-01-06 (×23): 12.5 g via INTRAVENOUS
  Filled 2017-12-29 (×26): qty 50

## 2017-12-29 MED ORDER — IPRATROPIUM-ALBUTEROL 0.5-2.5 (3) MG/3ML IN SOLN: 3.0000 mL | RESPIRATORY_TRACT | Status: DC | PRN

## 2017-12-29 MED ORDER — DOCUSATE SODIUM 100 MG PO CAPS
100.0000 mg | ORAL_CAPSULE | Freq: Two times a day (BID) | ORAL | Status: DC
  Administered 2017-12-30 – 2018-01-06 (×14): 100 mg via ORAL
  Filled 2017-12-29 (×14): qty 1

## 2017-12-29 MED ORDER — GUAIFENESIN ER 600 MG PO TB12
600.0000 mg | ORAL_TABLET | Freq: Two times a day (BID) | ORAL | Status: DC
  Administered 2017-12-30 – 2018-01-06 (×15): 600 mg via ORAL
  Filled 2017-12-29 (×16): qty 1

## 2017-12-29 MED ORDER — SENNA 8.6 MG PO TABS
2.0000 | ORAL_TABLET | Freq: Two times a day (BID) | ORAL | Status: DC
  Administered 2017-12-30 – 2018-01-06 (×14): 17.2 mg via ORAL
  Filled 2017-12-29 (×14): qty 2

## 2017-12-29 MED ORDER — DEXTROMETHORPHAN POLISTIREX ER 30 MG/5ML PO SUER
30.0000 mg | Freq: Two times a day (BID) | ORAL | Status: DC
  Administered 2017-12-30 – 2018-01-06 (×15): 30 mg via ORAL
  Filled 2017-12-29 (×17): qty 5

## 2017-12-29 MED ORDER — LACTULOSE 10 GM/15ML PO SOLN
30.0000 g | Freq: Three times a day (TID) | ORAL | Status: DC
Start: 2017-12-29 — End: 2018-01-06
  Administered 2017-12-29 – 2018-01-06 (×23): 30 g via ORAL
  Filled 2017-12-29 (×23): qty 60

## 2017-12-29 MED ORDER — DM-GUAIFENESIN ER 30-600 MG PO TB12
1.0000 | ORAL_TABLET | Freq: Two times a day (BID) | ORAL | Status: DC
  Filled 2017-12-29: qty 1

## 2017-12-29 MED ORDER — SODIUM CHLORIDE 0.9% FLUSH
10.0000 mL | INTRAVENOUS | Status: DC | PRN
  Administered 2017-12-31 (×3): 10 mL
  Filled 2017-12-29 (×3): qty 40

## 2017-12-29 MED ORDER — BISACODYL 5 MG PO TBEC: 5.0000 mg | DELAYED_RELEASE_TABLET | Freq: Every day | ORAL | Status: DC | PRN

## 2017-12-29 MED ORDER — HYDROCODONE-ACETAMINOPHEN 5-325 MG PO TABS: 1.0000 | ORAL_TABLET | ORAL | Status: DC | PRN
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
## 2017-12-29 NOTE — Clinical Social Work Note (Signed)
CSW is aware of the happenings from last evening. CSW will visit the patient to continue discussion of discharge planning when able today. The patient can return to [facility] at discharge should he choose.  Argentina Ponder, MSW, Theresia Majors [phone]

## 2017-12-29 NOTE — Consult Note (Signed)
[facility] Consult   Reason for Consult: Consult for 57 year old man with cirrhosis and multiple medical problems.  Concern about "capacity" Referring Physician:  Vianne Bulls  Patient Identification: Antonio York MRN:  [id number] Principal Diagnosis: Adjustment disorder with disturbance of emotion Diagnosis:   Patient Active Problem List   Diagnosis Date Noted  . Adjustment disorder with disturbance of emotion [F43.29] 12/29/2017  . Pressure injury of skin [L89.90] 12/27/2017  . Hepatic encephalopathy ([place]) [K72.90] 12/26/2017  . Hepatic cirrhosis ([place]) [K74.60] 12/18/2017  . Hyponatremia [E87.1] 12/14/2017    Total Time spent with patient: 1 hour  Subjective:   Antonio York is a 57 y.o. male patient admitted with "to get the fluid down off my legs".  HPI: Patient seen chart reviewed.  57 year old man with cirrhosis congestive heart failure.  Brought over here from [facility] because of worsening symptoms of his cirrhosis and CHF.  Concern was apparently raised because the patient was described as being "adamant" about returning home.  Patient is alert and oriented.  Able to tell me his basic medical problems and what brought him in the hospital.  Patient says his mood currently is a little down but not hopeless or discouraged and overall he feels optimistic about his care.  Some chronic difficulty sleeping.  Appetite okay.  Denies any psychotic symptoms or hallucinations.  Denies any suicidal or homicidal thoughts.  Patient is alert and oriented x4.  Able to remember 2 out of 3 objects at 3 minutes.  Patient tells me that he has been frustrated at [facility] because he feels like all he does there is a lie in bed all day and may be for a few minutes a day of physical therapist will come by.  He feels like it has really not been worth his time being there.  He says he would prefer to go back home.  I asked the patient whether he was able to ambulate and do some  self-care at home before initially coming into the hospital and winding up at rehab and he says that he was.  He admits however that in the past week he has only been able to stand up with the assistance of the physical therapist and does not know whether he would be able to ambulate at home.  He also knows that his wife told him that he can only come home if he is able to walk to the bathroom and back.  He acknowledges that his wife is not able to do all of his physical care because of his size and his needs.  Patient understands that the alternative option that has been floated is that he could get home physical therapy but probably only 2 or 3 times a week and just for physical therapy.  I asked him whether he would be willing to go back to rehab if he were not able to ambulate by the time he was ready for discharge from the hospital here and the patient told me that he would be willing to do that because it would be necessary.  Social history: Has a home with his wife.  Not working.  Chronic illness.  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  Denies recent alcohol or drug use.  Nothing in the old chart about substance abuse.  Medical history: Morbidly obese.  Cirrhosis.  Congestive heart failure COPD.  Past Psychiatric History: Patient says that he saw a counselor several years ago after his daughter died.  Has never been in a psychiatric hospital.  No history of suicide attempts.  Does not remember being prescribed any psychiatric medicine  Risk to Self: Is patient at risk for suicide?: No Risk to Others:   Prior Inpatient Therapy:   Prior Outpatient Therapy:    Past Medical History:  Past Medical History:  Diagnosis Date  . Asthma   . Cerebral palsy ([place])   . CHF (congestive heart failure) ([place])   . COPD (chronic obstructive pulmonary disease) ([place])   . Liver cirrhosis [facility])     Past Surgical History:  Procedure Laterality Date  .  CHOLECYSTECTOMY    . ESOPHAGOGASTRODUODENOSCOPY N/A 12/20/2017   Procedure: ESOPHAGOGASTRODUODENOSCOPY (EGD);  Surgeon: Toledo, Benay Pike, MD;  Location: [facility] ENDOSCOPY;  Service: Gastroenterology;  Laterality: N/A;  . LEG SURGERY Bilateral    Family History: History reviewed. No pertinent family history. Family Psychiatric  History: He says his mother had depression Social History:  Social History   Substance and Sexual Activity  Alcohol Use Not Currently     Social History   Substance and Sexual Activity  Drug Use Not Currently    Social History   Socioeconomic History  . Marital status: Married    Spouse name: Not on file  . Number of children: Not on file  . Years of education: Not on file  . Highest education level: Not on file  Occupational History  . Not on file  Social Needs  . Financial resource strain: Not hard at all  . Food insecurity:    Worry: Never true    Inability: Never true  . Transportation needs:    Medical: No    Non-medical: No  Tobacco Use  . Smoking status: Never Smoker  . Smokeless tobacco: Never Used  Substance and Sexual Activity  . Alcohol use: Not Currently  . Drug use: Not Currently  . Sexual activity: Not Currently  Lifestyle  . Physical activity:    Days per week: 0 days    Minutes per session: 0 min  . Stress: Not at all  Relationships  . Social connections:    Talks on phone: More than three times a week    Gets together: Once a week    Attends religious service: Never    Active member of club or organization: No    Attends meetings of clubs or organizations: Never    Relationship status: Married  Other Topics Concern  . Not on file  Social History Narrative   Lives at [facility] care center, married.   Additional Social History:    Allergies:  No Known Allergies  Labs:  Results for orders placed or performed during the hospital encounter of 12/26/17 (from the past 48 hour(s))  CBC with Differential/Platelet      Status: Abnormal   Collection Time: 12/28/17  5:11 AM  Result Value Ref Range   WBC 11.1 (H) 3.8 - 10.6 K/uL   RBC 2.35 (L) 4.40 - 5.90 MIL/uL   Hemoglobin 8.3 (L) 13.0 - 18.0 g/dL   HCT 24.3 (L) 40.0 - 52.0 %   MCV 103.1 (H) 80.0 - 100.0 fL   MCH 35.4 (H) 26.0 - 34.0 pg   MCHC 34.4 32.0 - 36.0 g/dL   RDW 17.2 (H) 11.5 - 14.5 %   Platelets 116 (L) 150 - 440 K/uL   Neutrophils Relative % 88 %   Neutro Abs 9.8 (H) 1.4 - 6.5 K/uL   Lymphocytes Relative 6 %   Lymphs Abs  0.6 (L) 1.0 - 3.6 K/uL   Monocytes Relative 6 %   Monocytes Absolute 0.7 0.2 - 1.0 K/uL   Eosinophils Relative 0 %   Eosinophils Absolute 0.0 0 - 0.7 K/uL   Basophils Relative 0 %   Basophils Absolute 0.0 0 - 0.1 K/uL    Comment: Performed at [facility], [address] [place], [place] [address]  Basic metabolic panel     Status: Abnormal   Collection Time: 12/28/17  5:11 AM  Result Value Ref Range   Sodium 126 (L) 135 - 145 mmol/L   Potassium 4.5 3.5 - 5.1 mmol/L   Chloride 94 (L) 101 - 111 mmol/L   CO2 24 22 - 32 mmol/L   Glucose, Bld 131 (H) 65 - 99 mg/dL   BUN 19 6 - 20 mg/dL   Creatinine, Ser 1.16 0.61 - 1.24 mg/dL   Calcium 8.3 (L) 8.9 - 10.3 mg/dL   GFR calc non Af Amer >60 >60 mL/min   GFR calc Af Amer >60 >60 mL/min    Comment: (NOTE) The eGFR has been calculated using the CKD EPI equation. This calculation has not been validated in all clinical situations. eGFR's persistently <60 mL/min signify possible Chronic Kidney Disease.    Anion gap 8 5 - 15    Comment: Performed at [facility], [address] [place], [place] [address]  Basic metabolic panel     Status: Abnormal   Collection Time: 12/28/17 11:21 AM  Result Value Ref Range   Sodium 124 (L) 135 - 145 mmol/L   Potassium 4.2 3.5 - 5.1 mmol/L   Chloride 93 (L) 101 - 111 mmol/L   CO2 23 22 - 32 mmol/L   Glucose, Bld 155 (H) 65 - 99 mg/dL   BUN 22 (H) 6 - 20 mg/dL   Creatinine, Ser 1.18 0.61 - 1.24 mg/dL   Calcium  8.3 (L) 8.9 - 10.3 mg/dL   GFR calc non Af Amer >60 >60 mL/min   GFR calc Af Amer >60 >60 mL/min    Comment: (NOTE) The eGFR has been calculated using the CKD EPI equation. This calculation has not been validated in all clinical situations. eGFR's persistently <60 mL/min signify possible Chronic Kidney Disease.    Anion gap 8 5 - 15    Comment: Performed at [facility], [address] [place], [place] [address]  serum sodium     Status: Abnormal   Collection Time: 12/29/17  4:50 AM  Result Value Ref Range   Sodium 124 (L) 135 - 145 mmol/L    Comment: Performed at [facility], [address] [place],  [address]  Comprehensive metabolic panel     Status: Abnormal   Collection Time: 12/29/17  9:24 AM  Result Value Ref Range   Sodium 122 (L) 135 - 145 mmol/L   Potassium 4.4 3.5 - 5.1 mmol/L   Chloride 91 (L) 101 - 111 mmol/L   CO2 21 (L) 22 - 32 mmol/L   Glucose, Bld 114 (H) 65 - 99 mg/dL   BUN 27 (H) 6 - 20 mg/dL   Creatinine, Ser 1.29 (H) 0.61 - 1.24 mg/dL   Calcium 8.6 (L) 8.9 - 10.3 mg/dL   Total Protein 6.1 (L) 6.5 - 8.1 g/dL   Albumin 2.8 (L) 3.5 - 5.0 g/dL   AST 82 (H) 15 - 41 U/L   ALT 45 17 - 63 U/L   Alkaline Phosphatase 101 38 - 126 U/L  Total Bilirubin 6.8 (H) 0.3 - 1.2 mg/dL   GFR calc non Af Amer >60 >60 mL/min   GFR calc Af Amer >60 >60 mL/min    Comment: (NOTE) The eGFR has been calculated using the CKD EPI equation. This calculation has not been validated in all clinical situations. eGFR's persistently <60 mL/min signify possible Chronic Kidney Disease.    Anion gap 10 5 - 15    Comment: Performed at [facility], [address] [place], [place] [address]  CBC     Status: Abnormal   Collection Time: 12/29/17  9:24 AM  Result Value Ref Range   WBC 15.8 (H) 3.8 - 10.6 K/uL   RBC 2.27 (L) 4.40 - 5.90 MIL/uL   Hemoglobin 8.1 (L) 13.0 - 18.0 g/dL   HCT 23.6 (L) 40.0 - 52.0 %   MCV 103.9 (H) 80.0 - 100.0 fL   MCH  35.5 (H) 26.0 - 34.0 pg   MCHC 34.2 32.0 - 36.0 g/dL   RDW 17.1 (H) 11.5 - 14.5 %   Platelets 139 (L) 150 - 440 K/uL    Comment: Performed at [facility], [address] [place], [place] [address]  Bilirubin, direct     Status: Abnormal   Collection Time: 12/29/17  9:30 AM  Result Value Ref Range   Bilirubin, Direct 2.2 (H) 0.1 - 0.5 mg/dL    Comment: Performed at [facility], [address] [place], [place] [address]    Current Facility-Administered Medications  Medication Dose Route Frequency Provider Last Rate Last Dose  . acetaminophen (TYLENOL) tablet 650 mg  650 mg Oral Q6H PRN Henreitta Leber, MD       Or  . acetaminophen (TYLENOL) suppository 650 mg  650 mg Rectal Q6H PRN Henreitta Leber, MD      . albumin human 25 % solution 12.5 g  12.5 g Intravenous Q8H Singh, Harmeet, MD      . bisacodyl (DULCOLAX) EC tablet 5 mg  5 mg Oral Daily PRN Epifanio Lesches, MD      . docusate sodium (COLACE) capsule 100 mg  100 mg Oral BID Epifanio Lesches, MD      . furosemide (LASIX) injection 40 mg  40 mg Intravenous BID Hillary Bow, MD   40 mg at 12/29/17 1130  . guaiFENesin-dextromethorphan (ROBITUSSIN DM) 100-10 MG/5ML syrup 5 mL  5 mL Oral Q4H PRN Henreitta Leber, MD      . heparin injection 5,000 Units  5,000 Units Subcutaneous Q8H Henreitta Leber, MD   5,000 Units at 12/29/17 0515  . HYDROcodone-acetaminophen (NORCO/VICODIN) 5-325 MG per tablet 1-2 tablet  1-2 tablet Oral Q4H PRN Epifanio Lesches, MD      . hydrocortisone cream 1 % 1 application  1 application Topical F6E PRN Sainani, Belia Heman, MD      . ipratropium-albuterol (DUONEB) 0.5-2.5 (3) MG/3ML nebulizer solution 3 mL  3 mL Nebulization Q4H PRN Epifanio Lesches, MD      . lactulose (CHRONULAC) 10 GM/15ML solution 30 g  30 g Oral TID Epifanio Lesches, MD   30 g at 12/29/17 1014  . lip balm (BLISTEX) ointment   Topical PRN Amelia Jo, MD      . MEDLINE mouth rinse  15 mL Mouth  Rinse BID Henreitta Leber, MD   15 mL at 12/28/17 2338  . midodrine (PROAMATINE) tablet 10 mg  10 mg Oral TID WC Henreitta Leber, MD   10 mg at 12/29/17 1015  .  nystatin (MYCOSTATIN/NYSTOP) topical powder   Topical BID Sudini, Alveta Heimlich, MD      . ondansetron [facility]) tablet 4 mg  4 mg Oral Q6H PRN Henreitta Leber, MD       Or  . ondansetron (ZOFRAN) injection 4 mg  4 mg Intravenous Q6H PRN Henreitta Leber, MD      . oxyCODONE (Oxy IR/ROXICODONE) immediate release tablet 5 mg  5 mg Oral Q6H PRN Arta Silence, MD   5 mg at 12/28/17 2338  . pantoprazole (PROTONIX) EC tablet 40 mg  40 mg Oral Daily Henreitta Leber, MD   40 mg at 12/29/17 1015  . predniSONE (DELTASONE) tablet 50 mg  50 mg Oral Q breakfast Epifanio Lesches, MD   50 mg at 12/29/17 1015  . rifaximin (XIFAXAN) tablet 550 mg  550 mg Oral BID Merlyn Lot, MD   550 mg at 12/29/17 1015  . senna (SENOKOT) tablet 17.2 mg  2 tablet Oral BID Epifanio Lesches, MD      . sodium chloride flush (NS) 0.9 % injection 10-40 mL  10-40 mL Intracatheter PRN Epifanio Lesches, MD      . spironolactone (ALDACTONE) tablet 25 mg  25 mg Oral Daily Henreitta Leber, MD   25 mg at 12/29/17 1015    Musculoskeletal: Strength & Muscle Tone: decreased Gait & Station: unable to stand Patient leans: Backward  Psychiatric Specialty Exam: Physical Exam  Nursing note and vitals reviewed. Constitutional: He appears well-developed and well-nourished.  HENT:  Head: Normocephalic and atraumatic.  Eyes: Pupils are equal, round, and reactive to light. Conjunctivae are normal.  Neck: Normal range of motion.  Cardiovascular: Regular rhythm and normal heart sounds.  Respiratory: Effort normal. No respiratory distress.  GI: Soft. He exhibits distension.  Musculoskeletal: Normal range of motion.       Legs: Neurological: He is alert.  Skin: Skin is warm and dry.  Psychiatric: He has a normal mood and affect. His speech is normal and behavior  is normal. Judgment and thought content normal. Cognition and memory are normal.    Review of Systems  Constitutional: Negative.   HENT: Negative.   Eyes: Negative.   Respiratory: Negative.   Cardiovascular: Negative.   Gastrointestinal: Negative.   Musculoskeletal: Negative.   Skin: Negative.   Neurological: Negative.   Psychiatric/Behavioral: Negative for depression, hallucinations, memory loss, substance abuse and suicidal ideas. The patient is nervous/anxious. The patient does not have insomnia.     Blood pressure (!) 126/54, pulse (!) 107, temperature 98.7 F (37.1 C), temperature source Oral, resp. rate 19, height 5' 6"  (1.676 m), weight (!) 158.8 kg (350 lb), SpO2 94 %.Body mass index is 56.49 kg/m.  General Appearance: Casual  Eye Contact:  Good  Speech:  Clear and Coherent  Volume:  Normal  Mood:  Dysphoric  Affect:  Appropriate  Thought Process:  Goal Directed  Orientation:  Full (Time, Place, and Person)  Thought Content:  Logical  Suicidal Thoughts:  No  Homicidal Thoughts:  No  Memory:  Immediate;   Fair Recent;   Fair Remote;   Fair  Judgement:  Fair  Insight:  Fair  Psychomotor Activity:  Decreased  Concentration:  Concentration: Fair  Recall:  [employer] of Knowledge:  Fair  Language:  Fair  Akathisia:  No  Handed:  Right  AIMS (if indicated):     Assets:  Desire for Improvement Housing Social Support  ADL's:  Impaired  Cognition:  WNL  Sleep:  Treatment Plan Summary: Plan 57 year old man with multiple medical problems.  Frustrated about the limited progress he made at rehab.  Frustrated at not being able to return home sooner.  However, to my interview today the patient has full capacity to make decisions in this area.  He understands his medical problems and he understands the options that have been proposed.  He understands the risks of trying to go home when he is still unstable on his feet and is able to think this through rationally.  No  need for any medication no specific new psychiatric diagnosis.  I can follow-up if needed.  Disposition: No evidence of imminent risk to self or others at present.   Patient does not meet criteria for psychiatric inpatient admission. Supportive therapy provided about ongoing stressors.  Alethia Berthold, MD 12/29/2017 1:46 PM

## 2017-12-29 NOTE — Progress Notes (Signed)
[facility], [place] 12/29/17  Subjective:   Patient known to our practice from previous admissions Patient left AMA yesterday but fell before getting into his right and therefore was readmitted.  Reports that he fell on his back but does not have any pain etc. related to it Sodium level is down to 122 this morning.  Nephrology consult for evaluation Denies any nausea or vomiting   Objective:  Vital signs in last 24 hours:  Temp:  [98.5 F (36.9 C)-98.8 F (37.1 C)] 98.5 F (36.9 C) (05/26 0532) Pulse Rate:  [102-109] 109 (05/26 0532) Resp:  [18-20] 18 (05/26 0532) BP: (95-132)/(54-63) 132/54 (05/26 0532) SpO2:  [94 %-97 %] 94 % (05/26 0709)  Weight change:  Filed Weights   12/26/17 1448  Weight: (!) 158.8 kg (350 lb)    Intake/Output:    Intake/Output Summary (Last 24 hours) at 12/29/2017 1037 Last data filed at 12/29/2017 1013 Gross per 24 hour  Intake 125 ml  Output 450 ml  Net -325 ml     Physical Exam: General:  No acute distress, laying in the bed, morbidly obese  HEENT  anicteric, dry oral mucous membranes  Neck  supple  Pulm/lungs  normal breathing effort, limited exam but clear to auscultation  CVS/Heart  regular, no rub  Abdomen:   Soft, nontender  Extremities:  2-3+ pitting edema  Neurologic:  Alert, oriented  Skin:  Scattered bruises over arms from the fall          Basic Metabolic Panel:  Recent Labs  Lab 12/26/17 1501 12/27/17 0906 12/28/17 0511 12/28/17 1121 12/29/17 0450 12/29/17 0924  NA 132* 131* 126* 124* 124* 122*  K 3.3* 3.2* 4.5 4.2  --  4.4  CL 99* 97* 94* 93*  --  91*  CO2 --  21*  GLUCOSE 159* 143* 131* 155*  --  114*  BUN 22*  --  27*  CREATININE 0.88 0.92 1.16 1.18  --  1.29*  CALCIUM 8.8* 8.8* 8.3* 8.3*  --  8.6*     CBC: Recent Labs  Lab 12/26/17 1501 12/27/17 0906 12/28/17 0511 12/29/17 0924  WBC 8.5 9.0 11.1* 15.8*  NEUTROABS 6.6*  --  9.8*  --   HGB 9.0*  8.9* 8.3* 8.1*  HCT 25.8* 25.6* 24.3* 23.6*  MCV 103.3* 103.9* 103.1* 103.9*  PLT 139* 119* 116* 139*     No results found for: HEPBSAG, HEPBSAB, HEPBIGM    Microbiology:  No results found for this or any previous visit (from the past 240 hour(s)).  Coagulation Studies: Recent Labs    12/26/17 1501  LABPROT 25.4*  INR 2.33    Urinalysis: No results for input(s): COLORURINE, LABSPEC, PHURINE, GLUCOSEU, HGBUR, BILIRUBINUR, KETONESUR, PROTEINUR, UROBILINOGEN, NITRITE, LEUKOCYTESUR in the last 72 hours.  Invalid input(s): APPERANCEUR    Imaging: Dg Chest 2 View  Result Date: 12/27/2017 CLINICAL DATA:  Wheezing EXAM: CHEST - 2 VIEW COMPARISON:  12/14/2017 FINDINGS: Cardiac shadow is stable. The lungs are well aerated bilaterally. Right chest wall port is noted with catheter tip in the mid superior vena cava. No focal infiltrate or effusion is seen. No bony abnormality is noted. IMPRESSION: No acute abnormality noted. Electronically Signed   By: Alcide Clever M.D.   On: 12/27/2017 11:12   Dg Lumbar Spine 2-3 Views  Result Date: 12/29/2017 CLINICAL DATA:  Low back pain, worse following a fall last night. EXAM: LUMBAR SPINE - 2-3 VIEW  COMPARISON:  None. FINDINGS: The examination is limited by the large size of the patient. 5 non-rib-bearing lumbar vertebrae. Minimal scoliosis. Approximately 25% L4 superior endplate compression deformity. No visible fracture lines, limited by the size of the patient. Mid and lower lumbar spine facet degenerative changes. Mild to moderate anterior spur formation at multiple levels. No pars defects. IMPRESSION: 1. Approximately 25% L4 superior endplate compression fracture, age indeterminate. 2. Multilevel degenerative changes. Electronically Signed   By: Beckie Salts M.D.   On: 12/29/2017 10:18     Medications:    . docusate sodium  100 mg Oral BID  . furosemide  40 mg Intravenous BID  . heparin  5,000 Units Subcutaneous Q8H  . lactulose  30 g Oral  TID  . mouth rinse  15 mL Mouth Rinse BID  . midodrine  10 mg Oral TID WC  . nystatin   Topical BID  . pantoprazole  40 mg Oral Daily  . predniSONE  50 mg Oral Q breakfast  . rifaximin  550 mg Oral BID  . senna  2 tablet Oral BID  . spironolactone  25 mg Oral Daily   acetaminophen **OR** acetaminophen, bisacodyl, guaiFENesin-dextromethorphan, HYDROcodone-acetaminophen, hydrocortisone cream, ipratropium-albuterol, lip balm, ondansetron **OR** ondansetron (ZOFRAN) IV, oxyCODONE  Assessment/ Plan:  57 y.o. male  withcerebral palsy, diastolic heart failure, liver cirrhosis secondary to nonalcoholic fatty liver disease/hemochromatosis, lower extremity edema, generalized debility,  1.  Hyponatremia 2.  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  Followed at [facility] 3.  Generalized edema/anasarca  Patient likely has hyponatremia secondary to liver cirrhosis.  Recent worsening with diuresis.  Patient also noted to have low albumin and generalized edema from hypoalbuminemia/third spacing Plan: Add IV albumin to patient's regimen Continue Lasix, spironolactone, Midrin We will follow   LOS: 3 Shaneca Orne Thedore Mins 5/26/201910:37 AM  [facility] [place], [place] [phone]  Note: This note was prepared with Dragon dictation. Any transcription errors are unintentional

## 2017-12-29 NOTE — Progress Notes (Signed)
Patient arrived back on the floor at approximately 2130 on 12/28/2017.

## 2017-12-29 NOTE — Consult Note (Signed)
Antonio Antigua, MD [address] [place], [place], [place], [address] [address], [place], [place], [place], [address] Phone: [phone]  Fax: [phone]  Consultation  Referring Provider:     Dr. Vianne Bulls  Primary Care Physician:  Donato Schultz, MD Reason for Consultation:     Elevated bilirubin  Date of Admission:  12/26/2017 Date of Consultation:  12/29/2017         HPI:   Antonio York is a 57 y.o. male with history of cirrhosis, secondary to Antonio York, follows with [facility] liver clinic, with self-reported hemochromatosis diagnosed in 2010 with GI consulted for elevated liver enzymes.  Patient was admitted with altered mental status, reportedly due to not taking his lactulose as prescribed.  Lactulose was resumed on this admission, and patient is now alert and oriented x3.  patient's bilirubin is chronically elevated, usually around 4-6, and has been elevated to 9 on this admission.  Transaminases, alk phos are normal.  [facility] notes reviewed in care everywhere.  Patient reports being diagnosed with the hemochromatosis in 2010 and receiving phlebotomy since then.  [facility] evaluation diagnosed him as likely Antonio York cirrhosis with secondary iron overload.  "Per chart review, he initially had elevated liver enzymes in 2005, was seen at [facility] at which time they thought the clinical picture was most c/w NAFLD. He had a fairly exhaustive lab w/u including ceruloplasmin, ASMA, ANA, AMA, A1AT and hepatitis serologies. He never had a liver biopsy at that time. He later had iron studies checked in 2013 and was found to have a ferritin of 1108 and iron sat of 72%. 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 The patient received phlebotomy every 6-8 weeks. He now has a ferritin level <100.   EGD 07/2016 showed no varices and mild PHG.  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 His ferritin remains low (<50). Since he is not C282Y homozygous, he may not re-accumulate significant iron. His cirrhosis is c/b edema and encephalopathy with no h/o GIB. It sounds like his recent hospitalization was prompted by an infection and then marked fluid overload but not really ascites as he does not describe needing paracentesis. None the less, his MELD score 1 year ago was 14 and is 19-22 now. He and his brother are asking about liver transplant. He has several barriers, most notably his size and functional status as well as possible pulmonary HTN. His BMI would need to be < 40. I acknowledged that exercise will be very difficult given physical limitations but patient is interested in referral to nutrition for dietary guidance. We discussed importance of protein in the diet and good carbohydrates.  In regards to his encephalopathy, we discussed lactulose titration and avoidance of large doses of narcotics or scheduled ativan.  Plan: -- labs today to calculate MELD score -- 2000 mg sodium diet and trip to nutritionist; no fluid restriction for now -- increase spironolactone to 128m (full pill, not 1/2 pill) -- Lactulose titration discussed and avoidance of scheduled ativan -- No phlebotomy indicated currently -- No varices on EGD so agree with stopping propranolol -- Needs [employer] vaccination series -- HCC and varices screens current -- Consider repeat Echo out of acute setting and OSA evaluation -- Follow-up in 3 months  Jama M. DDrue Novel MD [facility]"  Patient had  an EGD on May 15 by Dr. Alice Reichert, due to anemia, which showed portal hypertensive gastropathy, and no active bleeding.  Past Medical History:  Diagnosis Date  . Asthma   . Cerebral palsy ([place])   . CHF (congestive heart failure) ([place])   . COPD (chronic obstructive pulmonary disease) ([place])   . Liver cirrhosis [facility])     Past Surgical History:    Procedure Laterality Date  . CHOLECYSTECTOMY    . ESOPHAGOGASTRODUODENOSCOPY N/A 12/20/2017   Procedure: ESOPHAGOGASTRODUODENOSCOPY (EGD);  Surgeon: Toledo, Benay Pike, MD;  Location: [facility] ENDOSCOPY;  Service: Gastroenterology;  Laterality: N/A;  . LEG SURGERY Bilateral     Prior to Admission medications   Medication Sig Start Date End Date Taking? Authorizing Provider  diclofenac sodium (VOLTAREN) 1 % GEL Apply 4 g topically 4 (four) times daily as needed (back pain).   Yes [provider]  hydrocortisone cream 1 % Apply 1 application topically every 6 (six) hours as needed for itching. Apply to abdomen.   Yes [provider]  lactulose (CHRONULAC) 10 GM/15ML solution Take 45 mLs (30 g total) by mouth 3 (three) times daily. 12/21/17 01/20/18 Yes Sainani, Belia Heman, MD  lactulose (CHRONULAC) 10 GM/15ML solution Take 45 g by mouth 4 (four) times daily.   Yes [provider]  midodrine (PROAMATINE) 10 MG tablet Take 1 tablet (10 mg total) by mouth 3 (three) times daily with meals. Patient taking differently: Take 10 mg by mouth 2 (two) times daily.  12/21/17  Yes Sainani, Belia Heman, MD  senna (SENOKOT) 8.6 MG TABS tablet Take 2 tablets by mouth 2 (two) times daily.   Yes [provider]  spironolactone (ALDACTONE) 25 MG tablet Take 1 tablet (25 mg total) by mouth daily. 12/21/17  Yes Henreitta Leber, MD  torsemide (DEMADEX) 20 MG tablet Take 2 tablets (40 mg total) by mouth daily. 12/21/17  Yes Sainani, Belia Heman, MD  XIFAXAN 550 MG TABS tablet Take 550 mg by mouth 2 (two) times daily. 10/16/17  Yes [provider]  promethazine (PHENERGAN) 12.5 MG tablet Take 12.5 mg by mouth every 6 (six) hours as needed for nausea/vomiting.    [provider]  tuberculin (TUBERSOL) 5 UNIT/0.1ML injection Inject 0.1 mLs into the skin every 7 (seven) days.    [provider]    History reviewed. No pertinent family history.   Social History   Tobacco Use   . Smoking status: Never Smoker  . Smokeless tobacco: Never Used  Substance Use Topics  . Alcohol use: Not Currently  . Drug use: Not Currently    Allergies as of 12/26/2017  . (No Known Allergies)    Review of Systems:    All systems reviewed and negative except where noted in HPI.   Physical Exam:  Vital signs in last 24 hours: Vitals:   12/28/17 1344 12/28/17 2012 12/29/17 0532 12/29/17 0709  BP:  (!) 127/55 (!) 132/54   Pulse:  (!) 102 (!) 109   Resp:  18 18   Temp:  98.8 F (37.1 C) 98.5 F (36.9 C)   TempSrc:  Oral Oral   SpO2: 97% 97% 94% 94%  Weight:      Height:       Last BM Date: 12/28/17 General:   Pleasant, cooperative in NAD Head:  Normocephalic and atraumatic. Eyes:   No icterus.   Conjunctiva pink. PERRLA. Ears:  Normal auditory acuity. Neck:  Supple; no masses or thyroidomegaly Lungs: Respirations even and unlabored.  Lungs clear to auscultation bilaterally.   No wheezes, crackles, or rhonchi.  Abdomen:  Soft, nondistended, nontender. Normal bowel sounds. No appreciable masses or hepatomegaly.  No rebound or guarding.  Neurologic:  Alert and oriented x3;  grossly normal neurologically. Skin:  Intact without significant lesions or rashes. Cervical Nodes:  No significant cervical adenopathy. Psych:  Alert and cooperative. Normal affect.  LAB RESULTS: Recent Labs    12/26/17 1501 12/27/17 0906 12/28/17 0511  WBC 8.5 9.0 11.1*  HGB 9.0* 8.9* 8.3*  HCT 25.8* 25.6* 24.3*  PLT 139* 119* 116*   BMET Recent Labs    12/27/17 0906 12/28/17 0511 12/28/17 1121 12/29/17 0450  NA 131* 126* 124* 124*  K 3.2* 4.5 4.2  --   CL 97* 94* 93*  --   CO2 26 24 23   --   GLUCOSE 143* 131* 155*  --   BUN 15 19 22*  --   CREATININE 0.92 1.16 1.18  --   CALCIUM 8.8* 8.3* 8.3*  --    LFT Recent Labs    12/26/17 1501 12/27/17 0906  PROT 6.2* 6.1*  ALBUMIN 2.9* 2.8*  AST 83* 70*  ALT 39 39  ALKPHOS 115 101  BILITOT 8.5* 9.4*  BILIDIR 2.3*  --   IBILI  6.2*  --    PT/INR Recent Labs    12/26/17 1501  LABPROT 25.4*  INR 2.33    STUDIES: Dg Chest 2 View  Result Date: 12/27/2017 CLINICAL DATA:  Wheezing EXAM: CHEST - 2 VIEW COMPARISON:  12/14/2017 FINDINGS: Cardiac shadow is stable. The lungs are well aerated bilaterally. Right chest wall port is noted with catheter tip in the mid superior vena cava. No focal infiltrate or effusion is seen. No bony abnormality is noted. IMPRESSION: No acute abnormality noted. Electronically Signed   By: Inez Catalina M.D.   On: 12/27/2017 11:12      Impression / Plan:   Yunus Stoklosa is a 57 y.o. y/o male with history of cirrhosis likely from [place], with secondary iron overload, as evaluated by [facility] liver clinic (notes reviewed), admitted with altered mental status due to noncompliance to lactulose outpatient, with GI consulted for elevated liver enzymes  MRCP was recommended due to elevated bilirubin However, patient is unable to fit in the MRI machine His alk phos is normal, thus elevated bilirubin by itself with no abdominal pain, is not likely  due to biliary duct obstruction In addition, patient has also had a cholecystectomy years ago  I will add direct bilirubin to patient's morning's labs today, to fractionate total bilirubin Continue to avoid hepatotoxic medication Continue daily CMP  As per [facility] liver clinic notes, patient has had extensive work-up by [facility] for cirrhosis, and autoimmune work-up, thus no indication for repeat testing at this time, given chronic elevation of bilirubin, with acute worsening of bilirubin, likely due to worsening liver disease  Patient states, [facility] MRI machine is able to do his MRIs An outpatient MRI at [facility] can be considered if it is needed, if bilirubin does not improve.  No clinical evidence of cholangitis at this time  His ferritin on May 15 was above 200 Typically, for iron overload, phlebotomy is done with goal of maintaining ferritin less than  100 Recommend hematology consult to evaluate for phlebotomy  Prior to discharge, an appointment with [facility] liver clinic should be made for the patient, so he can follow-up with him closely, as he prefers to follow-up with their team in regard  to his cirrhosis  No evidence of GI bleeding at this time No indication for endoscopy  Hemoglobin is stable on this admission EGD was last on Dec 18, 2017, see endoscopy report Outpatient colonoscopy can be done for colorectal cancer screening by patient's primary GI at [facility]  Continue lactulose and rifaximin No acute confusion at this time   Thank you for involving me in the care of this patient.      LOS: 3 days   Virgel Manifold, MD  12/29/2017, 9:19 AM

## 2017-12-30 DIAGNOSIS — K7469 Other cirrhosis of liver: Secondary | ICD-10-CM

## 2017-12-30 DIAGNOSIS — D649 Anemia, unspecified: Secondary | ICD-10-CM

## 2017-12-30 DIAGNOSIS — E871 Hypo-osmolality and hyponatremia: Secondary | ICD-10-CM

## 2017-12-30 DIAGNOSIS — D696 Thrombocytopenia, unspecified: Secondary | ICD-10-CM

## 2017-12-30 LAB — CBC
HCT: 21.8 % — ABNORMAL LOW (ref 40.0–52.0)
HEMOGLOBIN: 7.6 g/dL — AB (ref 13.0–18.0)
MCH: 35.9 pg — AB (ref 26.0–34.0)
MCHC: 34.6 g/dL (ref 32.0–36.0)
MCV: 103.8 fL — ABNORMAL HIGH (ref 80.0–100.0)
PLATELETS: 115 10*3/uL — AB (ref 150–440)
RBC: 2.11 MIL/uL — ABNORMAL LOW (ref 4.40–5.90)
RDW: 17.2 % — AB (ref 11.5–14.5)
WBC: 12.1 10*3/uL — ABNORMAL HIGH (ref 3.8–10.6)

## 2017-12-30 LAB — SEDIMENTATION RATE: SED RATE: 29 mm/h — AB (ref 0–20)

## 2017-12-30 LAB — BASIC METABOLIC PANEL
ANION GAP: 9 (ref 5–15)
BUN: 30 mg/dL — AB (ref 6–20)
CALCIUM: 8.6 mg/dL — AB (ref 8.9–10.3)
CO2: 24 mmol/L (ref 22–32)
CREATININE: 1.28 mg/dL — AB (ref 0.61–1.24)
Chloride: 91 mmol/L — ABNORMAL LOW (ref 101–111)
GFR calc Af Amer: >60 mL/min (ref >60)
GLUCOSE: 133 mg/dL — AB (ref 65–99)
Potassium: 4.5 mmol/L (ref 3.5–5.1)
Sodium: 124 mmol/L — ABNORMAL LOW (ref 135–145)

## 2017-12-30 LAB — URINE CULTURE

## 2017-12-30 MED ORDER — DEXTROSE 5 % IV SOLN
10.0000 mg/h | INTRAVENOUS | Status: AC
  Administered 2017-12-30: 10 mg/h via INTRAVENOUS
  Filled 2017-12-30 (×2): qty 25

## 2017-12-30 MED ORDER — MORPHINE SULFATE (PF) 2 MG/ML IV SOLN
2.0000 mg | Freq: Three times a day (TID) | INTRAVENOUS | Status: DC | PRN
  Administered 2018-01-03 – 2018-01-06 (×3): 2 mg via INTRAVENOUS
  Filled 2017-12-30 (×4): qty 1
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
## 2017-12-30 NOTE — Progress Notes (Signed)
1        [employer] [facility]   PATIENT NAME: Antonio York    MR#:  [id number]  DATE OF BIRTH:  [birth year]  SUBJECTIVE:  CHIEF COMPLAINT:   Chief Complaint  Patient presents with  . Abdominal Pain  Very emotional not wanting to go back to [facility] and prefers to go home with home health REVIEW OF SYSTEMS:  Review of Systems  Constitutional: Negative for chills, fever and weight loss.  HENT: Negative for nosebleeds and sore throat.   Eyes: Negative for blurred vision.  Respiratory: Negative for cough, shortness of breath and wheezing.   Cardiovascular: Negative for chest pain, orthopnea, leg swelling and PND.  Gastrointestinal: Negative for abdominal pain, constipation, diarrhea, heartburn, nausea and vomiting.  Genitourinary: Negative for dysuria and urgency.  Musculoskeletal: Positive for back pain and falls.  Skin: Negative for rash.  Neurological: Negative for dizziness, speech change, focal weakness and headaches.  Endo/Heme/Allergies: Does not bruise/bleed easily.  Psychiatric/Behavioral: Negative for depression.    DRUG ALLERGIES:  No Known Allergies VITALS:  Blood pressure (!) 116/58, pulse 99, temperature 97.8 F (36.6 C), temperature source Oral, resp. rate (!) 22, height  (1.676 m), weight (!) 158.4 kg (349 lb 4.8 oz), SpO2 96 %. PHYSICAL EXAMINATION:  Physical Exam  Constitutional: He is oriented to person, place, and time. He appears well-developed.  HENT:  Head: Normocephalic and atraumatic.  Eyes: Pupils are equal, round, and reactive to light. Conjunctivae and EOM are normal.  Neck: Normal range of motion. Neck supple. No tracheal deviation present. No thyromegaly present.  Cardiovascular: Normal rate, regular rhythm and normal heart sounds.  Pulmonary/Chest: Effort normal and breath sounds normal. No respiratory distress. He has no wheezes. He exhibits no tenderness.  Abdominal: Soft. Bowel sounds are normal.  He exhibits no distension. There is no tenderness.  Musculoskeletal: Normal range of motion. He exhibits edema.  Neurological: He is alert and oriented to person, place, and time. No cranial nerve deficit.  Skin: Skin is warm and dry. No rash noted.   LABORATORY PANEL:  Male CBC Recent Labs  Lab 12/30/17 0506  WBC 12.1*  HGB 7.6*  HCT 21.8*  PLT 115*   ------------------------------------------------------------------------------------------------------------------ Chemistries  Recent Labs  Lab 12/29/17 0924 12/30/17 0506  NA 122* 124*  K 4.4 4.5  CL 91* 91*  CO2 21* 24  GLUCOSE 114* 133*  BUN 27* 30*  CREATININE 1.29* 1.28*  CALCIUM 8.6* 8.6*  AST 82*  --   ALT 45  --   ALKPHOS 101  --   BILITOT 6.8*  --    RADIOLOGY:  No results found. ASSESSMENT AND PLAN:   57 year old male patient with multiple medical problems of all nonalcoholic liver cirrhosis, hereditary hemochromatosis, morbid obesity, COPD, multiple recurrent admissions admitted third time this month, signed out AMA last night, brought back to the room in wheelchair without readmitting , now being admitted.  1.  Acute hyponatremia in the context of liver cirrhosis: We will start Lasix drip, add Midodrin for better blood pressure support, also on albumin.  Monitor sodium  2.  Nonalcoholic liver cirrhosis, hereditary hemochromatosis, slight worsening of jaundice, Dr. Maximino Greenland recommendeds MRCP as an outpatient [facility] at [facility] but because patient is obese he is not able to fit in the MRI machine here.  Reorder liver function labs.  Patient can follow-up with [facility] liver clinic for routine follow-ups.  Patient baseline bilirubin is around 6.  3.  Anasarca: start Lasix drip, add Midodrin for better blood pressure support, also on albumin  4. L4 superior endplate compression fracture status post fall, patient fell in parking lot and landed landed on his back but he denies acute back pain.  Consult  neurosurgery  5.  Deconditioning, patient is from [facility],, physical therapy saw the patient, recommended SNF placement.  Patient does not want to go to [facility].  Prefers to go home with home health.  I requested physical therapy to reevaluate the patient and ambulate him daily.  Talked with patient's wife who is not very excited about taking him back home as he is difficult to take care of home with his morbid obesity/ significant weight gain.  Per Physical therapy if he chooses to return home, he will require a hospital bed, Lift system (hoyer type), bariatric bedside commode, bariatric walker and gait belt for progression of mobility. He will need +2 -+3 at this time for safe transfers with lift system. He is unable to stand with a walker at this time. Pt was unrealistic in his mobility status at start of session but had a better understanding as session progressed. Family/friends in upon arrival and did not support him returning home so it is unclear how much assist they would be to him (Wife not in attendance). HHPT services would be limited to typically 2-3 times per week but given assist level, their ability to progress standing mobility may be limited as he requires extensive assist currently.  6.  GERD, continue Protonix  7.  Chronic hypotension: Patient is on low-dose Midodrin. Blood pressure stable.       All the records are reviewed and case discussed with Care Management/Social Worker. Management plans discussed with the patient, family (wife over phone) and they are in agreement.  CODE STATUS: DNR  TOTAL TIME TAKING CARE OF THIS PATIENT: 55 minutes.   More than 50% of the time was spent in counseling/coordination of care: YES  POSSIBLE D/C IN 2-3 DAYS, DEPENDING ON CLINICAL CONDITION.   Delfino Lovett M.D on 12/30/2017 at 2:29 PM  Between 7am to 6pm - Pager - [phone]  After 6pm go to [email or web address] - Social research officer, government  [facility]  [facility]  Office  [phone]  CC: Primary care physician; Dimple Casey, MD  Note: This dictation was prepared with Dragon dictation along with smaller phrase technology. Any transcriptional errors that result from this process are unintentional.

## 2017-12-30 NOTE — Progress Notes (Signed)
[facility]  ROUNDING NOTE   Subjective:  Patient well-known to [place] from prior admission. 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 He previously left AGAINST MEDICAL ADVICE and fell in the parking lot. We are consulted for recurrent hyponatremia.   Objective:  Vital signs in last 24 hours:  Temp:  [97.7 F (36.5 C)-98.7 F (37.1 C)] 97.8 F (36.6 C) (05/27 0457) Pulse Rate:  [99-107] 99 (05/27 0457) Resp:  [19-24] 22 (05/27 0457) BP: (106-126)/(46-58) 116/58 (05/27 0457) SpO2:  [94 %-96 %] 96 % (05/27 0457) Weight:  [158.4 kg (349 lb 4.8 oz)] 158.4 kg (349 lb 4.8 oz) (05/27 0457)  Weight change:  Filed Weights   12/26/17 1448 12/30/17 0457  Weight: (!) 158.8 kg (350 lb) (!) 158.4 kg (349 lb 4.8 oz)    Intake/Output: I/O last 3 completed shifts: In: 345 [P.O.:245; IV Piggyback:100] Out: 600 [Urine:600]   Intake/Output this shift:  Total I/O In: -  Out: 375 [Urine:375]  Physical Exam: General: NAD, laying in bed  Head: Normocephalic, atraumatic. Moist oral mucosal membranes  Eyes: Icterus noted  Neck: Supple, trachea midline  Lungs:  Clear to auscultation, normal effort  Heart: Regular rate and rhythm  Abdomen:  Obese   Extremities: trace peripheral edema.  Neurologic: Nonfocal, moving all four extremities  Skin: No lesions       Basic Metabolic Panel: Recent Labs  Lab 12/27/17 0906 12/28/17 0511 12/28/17 1121 12/29/17 0450 12/29/17 0924 12/30/17 0506  NA 131* 126* 124* 124* 122* 124*  K 3.2* 4.5 4.2  --  4.4 4.5  CL 97* 94* 93*  --  91* 91*  CO2 --  21* 24  GLUCOSE 143* 131* 155*  --  114* 133*  BUN 15 19 22*  --  27* 30*  CREATININE 0.92 1.16 1.18  --  1.29* 1.28*  CALCIUM 8.8* 8.3* 8.3*  --  8.6* 8.6*    Liver Function Tests: Recent Labs  Lab 12/26/17 1501 12/27/17 0906 12/29/17 0924  AST 83* 70* 82*  ALT 39 39 45  ALKPHOS 115 101 101  BILITOT 8.5* 9.4* 6.8*  PROT 6.2* 6.1* 6.1*  ALBUMIN  2.9* 2.8* 2.8*   Recent Labs  Lab 12/26/17 1501  LIPASE 30   Recent Labs  Lab 12/26/17 1501 12/27/17 0906  AMMONIA 109* 73*    CBC: Recent Labs  Lab 12/26/17 1501 12/27/17 0906 12/28/17 0511 12/29/17 0924 12/30/17 0506  WBC 8.5 9.0 11.1* 15.8* 12.1*  NEUTROABS 6.6*  --  9.8*  --   --   HGB 9.0* 8.9* 8.3* 8.1* 7.6*  HCT 25.8* 25.6* 24.3* 23.6* 21.8*  MCV 103.3* 103.9* 103.1* 103.9* 103.8*  PLT 139* 119* 116* 139* 115*    Cardiac Enzymes: No results for input(s): CKTOTAL, CKMB, CKMBINDEX, TROPONINI in the last 168 hours.  BNP: Invalid input(s): POCBNP  CBG: No results for input(s): GLUCAP in the last 168 hours.  Microbiology: Results for orders placed or performed during the hospital encounter of 12/14/17  Blood culture (routine x 2)     Status: None   Collection Time: 12/14/17  9:22 AM  Result Value Ref Range Status   Specimen Description BLOOD R PORT  Final   Special Requests   Final    BOTTLES DRAWN AEROBIC AND ANAEROBIC Blood Culture results may not be optimal due to an excessive volume of blood received in culture bottles   Culture   Final    NO GROWTH 5 DAYS Performed  at [facility], [address] [place], [place] [address]    Report Status 12/19/2017 FINAL  Final  Blood culture (routine x 2)     Status: None   Collection Time: 12/14/17 12:58 PM  Result Value Ref Range Status   Specimen Description BLOOD RT [facility]  Final   Special Requests   Final    BOTTLES DRAWN AEROBIC AND ANAEROBIC Blood Culture adequate volume   Culture   Final    NO GROWTH 5 DAYS Performed at [facility], [address] [place], [place] [address]    Report Status 12/19/2017 FINAL  Final  MRSA PCR Screening     Status: None   Collection Time: 12/14/17  3:42 PM  Result Value Ref Range Status   MRSA by PCR NEGATIVE NEGATIVE Final    Comment:        The GeneXpert MRSA Assay (FDA approved for NASAL specimens only), is one component of a comprehensive  MRSA colonization surveillance program. It is not intended to diagnose MRSA infection nor to guide or monitor treatment for MRSA infections. Performed at [facility], [address] [place], [place] [address]   Urine culture     Status: Abnormal   Collection Time: 12/15/17 12:25 AM  Result Value Ref Range Status   Specimen Description   Final    URINE, RANDOM Performed at [facility], [address] [place], [place] [address]    Special Requests   Final    NONE Performed at [facility], [address] [place], [place] [address]    Culture (A)  Final    <10,000 COLONIES/mL INSIGNIFICANT GROWTH Performed at [facility], [address] [place], [place] [address]    Report Status 12/16/2017 FINAL  Final    Coagulation Studies: No results for input(s): LABPROT, INR in the last 72 hours.  Urinalysis: No results for input(s): COLORURINE, LABSPEC, PHURINE, GLUCOSEU, HGBUR, BILIRUBINUR, KETONESUR, PROTEINUR, UROBILINOGEN, NITRITE, LEUKOCYTESUR in the last 72 hours.  Invalid input(s): APPERANCEUR    Imaging: Dg Lumbar Spine 2-3 Views  Result Date: 12/29/2017 CLINICAL DATA:  Low back pain, worse following a fall last night. EXAM: LUMBAR SPINE - 2-3 VIEW COMPARISON:  None. FINDINGS: The examination is limited by the large size of the patient. 5 non-rib-bearing lumbar vertebrae. Minimal scoliosis. Approximately 25% L4 superior endplate compression deformity. No visible fracture lines, limited by the size of the patient. Mid and lower lumbar spine facet degenerative changes. Mild to moderate anterior spur formation at multiple levels. No pars defects. IMPRESSION: 1. Approximately 25% L4 superior endplate compression fracture, age indeterminate. 2. Multilevel degenerative changes. Electronically Signed   By: Beckie Salts M.D.   On: 12/29/2017 10:18     Medications:   . albumin human    . furosemide (LASIX) infusion     . dextromethorphan  30  mg Oral BID  . docusate sodium  100 mg Oral BID  . guaiFENesin  600 mg Oral BID  . heparin  5,000 Units Subcutaneous Q8H  . lactulose  30 g Oral TID  . mouth rinse  15 mL Mouth Rinse BID  . midodrine  10 mg Oral TID WC  . nystatin   Topical BID  . pantoprazole  40 mg Oral Daily  . predniSONE  50 mg Oral Q breakfast  . rifaximin  550 mg Oral BID  . senna  2 tablet Oral BID  . spironolactone  25 mg Oral Daily   acetaminophen **  OR** acetaminophen, bisacodyl, guaiFENesin-dextromethorphan, hydrocortisone cream, ipratropium-albuterol, lip balm, morphine injection, ondansetron **OR** ondansetron (ZOFRAN) IV, sodium chloride flush  Assessment/ Plan:  [name] is a 57 y.o. white male with cerebral palsy, diastolic heart failure, liver cirrhosis secondary to nonalcoholic fatty liver disease/hemochromatosis, lower extremity edema, generalized debility, who was admitted to [facility] on 12/14/2017 for evaluation of confusion.   1.  Hyponatremia. 2.  Hypotension. 3.  Liver cirrhosis secondary to [place]/hemochromatosis. MELD score on 5/14 of 30 4.  Anemia unspecified.  Plan: Consistent with hypervolemic hypo-osmolar hyponatremia secondary to hepatic cirrhosis -Patient has been started on Lasix drip.  Serum sodium up to 124.  He is also on albumin.  Continue midodrine for relative hypotension as well.  Continue to monitor serum sodium periodically.  Overall prognosis guarded.    LOS: 4 Hassani Sliney 5/27/201911:52 AM

## 2017-12-30 NOTE — Consult Note (Signed)
Neurosurgery-New Consultation Evaluation 12/30/2017 Haitham Dolinsky [id number]  Identifying Statement: Antonio York is a 57 y.o. male from [place] [address] with back pain  Physician Requesting Consultation: Delfino Lovett  History of Present Illness: Antonio York is admitted with known liver disease and deconditioning. He does state he had a fall in the parking lot recently but denies any acute back pain. He does have chronic back pain which he has felt getting worse over the years. He does state he will get intermittent numbness in his legs. He states he hasn't been ambulatory in months. He was brought from a rehab facility for his acute medical issues. Xray was obtained after the fall and concerning for a subacute to chronic fracture.   Past Medical History:  Past Medical History:  Diagnosis Date  . Asthma   . Cerebral palsy (HCC)   . CHF (congestive heart failure) (HCC)   . COPD (chronic obstructive pulmonary disease) (HCC)   . Liver cirrhosis (HCC)     Social History: Social History   Socioeconomic History  . Marital status: Married    Spouse name: Not on file  . Number of children: Not on file  . Years of education: Not on file  . Highest education level: Not on file  Occupational History  . Not on file  Social Needs  . Financial resource strain: Not hard at all  . Food insecurity:    Worry: Never true    Inability: Never true  . Transportation needs:    Medical: No    Non-medical: No  Tobacco Use  . Smoking status: Never Smoker  . Smokeless tobacco: Never Used  Substance and Sexual Activity  . Alcohol use: Not Currently  . Drug use: Not Currently  . Sexual activity: Not Currently  Lifestyle  . Physical activity:    Days per week: 0 days    Minutes per session: 0 min  . Stress: Not at all  Relationships  . Social connections:    Talks on phone: More than three times a week    Gets together: Once a week    Attends religious service: Never    Active member of club or  organization: No    Attends meetings of clubs or organizations: Never    Relationship status: Married  . Intimate partner violence:    Fear of current or ex partner: No    Emotionally abused: No    Physically abused: No    Forced sexual activity: No  Other Topics Concern  . Not on file  Social History Narrative   Lives at [facility] care center, married.    Family History: History reviewed. No pertinent family history.  Review of Systems:  Review of Systems - General ROS: Negative Psychological ROS: Negative Ophthalmic ROS: Negative ENT ROS: Negative Hematological and Lymphatic ROS: Negative  Endocrine ROS: Negative Respiratory ROS: Negative Cardiovascular ROS: Negative Gastrointestinal ROS: Negative Genito-Urinary ROS: Negative Musculoskeletal ROS: Positive for back pain Neurological ROS: Negative for persistent numbness, Positive for weakness Dermatological ROS: Negative  Physical Exam: BP (!) 106/49 (BP Location: Left Arm)   Pulse 85   Temp (!) 97.5 F (36.4 C) (Oral)   Resp 20   Ht  (1.676 m)   Wt (!) 158.4 kg (349 lb 4.8 oz)   SpO2 97%   BMI 56.38 kg/m  Body mass index is 56.38 kg/m. Body surface area is 2.72 meters squared. General appearance: Alert, cooperative, in no acute distress, lying supine in bed Head: Normocephalic,  atraumatic Eyes: Normal, EOM intact Oropharynx: Moist without lesions Ext: Severe edema in lower extremities  Neurologic exam:  Mental status: alertness: alert, affect: normal Speech: fluent and clear Motor: Strength is diminished throughout lower extremities, 4-/5 in bilateral hip flexion and knee extension, dorsiflexion appears 3/5 but limited by edema, 4+/5 in plantar flexion Sensory: decreased to light touch throughout lower extremities Gait: not tested    Imaging: Lumbar Spine Xrays: 1. Approximately 25% L4 superior endplate compression fracture, age indeterminate. 2. Multilevel degenerative  changes.   Impression/Plan:  Mr. Wrede is seen to have a L4 endplate fracture of unknown chronicity. He does not have significant back pain or radicular symptoms. I do not see a role for surgical intervention at this time. Recommend PT as needed and pain control. Can consider back brace for comfort if patient desires. Recommend fall precautions.    1.  Diagnosis: L4 fracture of unknown chonicity  2.  Plan - No surgery recommended - Pain control and PT - No follow up needed

## 2017-12-30 NOTE — Care Management Important Message (Signed)
Important Message  Patient Details  Name: Antonio York MRN: [id number] Date of Birth: 1960/09/27   Medicare Important Message Given:  Yes    Olegario Messier A [name] 12/30/2017, 11:57 AM

## 2017-12-30 NOTE — Progress Notes (Signed)
I attempted to see Antonio York today. He was on the phone and I told him I will see him in AM. I do not see any need for surgical intervention on imaging. Can continue with pain control and PT.

## 2017-12-30 NOTE — Progress Notes (Addendum)
Physical Therapy Treatment Patient Details Name: Antonio York MRN: [id number] DOB: 12/31/1960 Today's Date: 12/30/2017    History of Present Illness 57 yo male with 3rd admission this month has new AMS with encephalopathy of hepatic origin due to not receiving lactulose in SNF.  Recent fall with sutured LLE laceration, low Na+  and disuse atrophy of mm's.  PMHx:  encephalopathy, NASH cirrhosis, CP, CHF, morbid obesity, asthma, COPD, hemochromatosis    PT Comments    Pt stating he is hoping to return home vs returning to rehab.  States he needs to walk to bathroom and back in order to do so.  To edge of bed with mod a x 2.  Pt generally unsteady initially in sitting until feet were placed on floor.  He has limited knee flexion bilaterally which makes effective placement difficulty.  Once sitting and steadied on walker, he remained sitting with min guard.  Standing trials with +2 assist and +2 for standby assist.  He was unable to stand from bedside despite attempt.  He required +4 to reposition in supine as he was too far towards end of bed and unable to lateral scoot back towards head of bed.  Participated in exercises as described below.  Pt was encouraged to participate in HEP frequently on his own during stay.  Discussed at length discharge plan.  He voiced his frustration over his failing health and his perception that rehab has been a "waste of time" despite him stating he was able to walk about 130' a few weeks ago at rehab.  Discussed barriers to progress.  Pt stated he was not interested in returning to [facility].  Encouraged pt to consider other facilities for a "fresh start".  Stressed current mobility status and difficulty/barriers for returning home.   If he chooses to return home, he will require a hospital bed, Lift system (hoyer type), bariatric bedside commode, bariatric walker and gait belt for progression of mobility.  He will need +2 -+3 at this time for safe  transfers with lift system.  He is unable to stand with a walker at this time.  Pt was unrealistic in his mobility status at start of session but had a better understanding as session progressed.  Family/friends in upon arrival and did not support him returning home so it is unclear how much assist they would be to him (Wife not in attendance).  HHPT services would be limited to typically 2-3 times per week but given assist level, their ability to progress standing mobility may be limited as he requires extensive assist currently.  Pt voiced his understanding of this.   Follow Up Recommendations  SNF     Equipment Recommendations  Other (comment)    Recommendations for Other Services       Precautions / Restrictions Precautions Precautions: Fall Precaution Comments: recent syncopal episode Restrictions Weight Bearing Restrictions: No Other Position/Activity Restrictions: painful with LLE laceration    Mobility  Bed Mobility Overal bed mobility: Needs Assistance Bed Mobility: Supine to Sit;Sit to Supine     Supine to sit: Mod assist;+2 for physical assistance Sit to supine: Max assist;+2 for physical assistance;+2 for safety/equipment   General bed mobility comments: +4 to scoot fully up in bed as LE's were off foot of bed upon returning to supine.  Transfers Overall transfer level: Needs assistance Equipment used: Rolling walker (2 wheeled)   Sit to Stand: Max assist;+2 physical assistance;+2 safety/equipment;From elevated surface  General transfer comment: unable to stand despite assist   Ambulation/Gait             General Gait Details: cannot get pt to stand well enough to attempt   Stairs             Wheelchair Mobility    Modified Rankin (Stroke Patients Only)       Balance Overall balance assessment: Needs assistance;History of Falls Sitting-balance support: Feet supported;Bilateral upper extremity supported Sitting balance-Leahy  Scale: Fair Sitting balance - Comments: pt with limited knee flexion bialterally preventing functional foot placement for balance and also effective  transfers.     Standing balance-Leahy Scale: Zero                              Cognition Arousal/Alertness: Awake/alert Behavior During Therapy: WFL for tasks assessed/performed Overall Cognitive Status: Within Functional Limits for tasks assessed                                        Exercises Other Exercises Other Exercises: BLE AAROM for ankle pumps, heel slides, ab/add slr 2 x 10    General Comments        Pertinent Vitals/Pain Pain Assessment: 0-10 Pain Score: 6  Pain Location: ribs from coughing Pain Intervention(s): Limited activity within patient's tolerance    Home Living                      Prior Function            PT Goals (current goals can now be found in the care plan section) Progress towards PT goals: Not progressing toward goals - comment    Frequency    Min 2X/week      PT Plan Current plan remains appropriate    Co-evaluation              AM-PAC PT "6 Clicks" Daily Activity  Outcome Measure  Difficulty turning over in bed (including adjusting bedclothes, sheets and blankets)?: Unable Difficulty moving from lying on back to sitting on the side of the bed? : Unable Difficulty sitting down on and standing up from a chair with arms (e.g., wheelchair, bedside commode, etc,.)?: Unable Help needed moving to and from a bed to chair (including a wheelchair)?: Total Help needed walking in hospital room?: Total Help needed climbing 3-5 steps with a railing? : Total 6 Click Score: 6    End of Session Equipment Utilized During Treatment: Gait belt   Patient left: in bed;with bed alarm set;with call bell/phone within reach;with nursing/sitter in room Nurse Communication: Mobility status Pain - Right/Left: Left Pain - part of body: Leg     Time:  1250-1337 PT Time Calculation (min) (ACUTE ONLY): 47 min  Charges:  $Therapeutic Exercise: 8-22 mins $Therapeutic Activity: 23-37 mins                    G Codes:      Danielle Dess, PTA 12/30/17, 1:58 PM

## 2017-12-30 NOTE — Consult Note (Signed)
[facility]  Date of admission:  12/29/2017  Inpatient day:  12/30/2017  Consulting physician:  Dr. Max Sane.  Reason for Consultation:  Ferritin on May 15 was above 200, c/s requested per GI on need for phlebotomy on this patient with hemochromatosis.  Chief Complaint: Antonio York is a 57 y.o. 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  HPI:  The patient has a history of cirrhosis secondary to an [place] followed by the [facility] liver clinic.  He states that he was diagnosed with hemochromatosis in 2013.  At that time he "did not feel well and was tired".  Ferritin was 1108 with an iron saturation of 72%.  He was initially seen by hematology.  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  He was had mild polycythemia with a hematocrit of 50 (Jak 2- on 03/29/2015).  He states that he initially underwent phlebotomy every 6 to 8 weeks.  His physician changed practices in 2016 and he was thus followed at the [facility] liver clinic.  Last phlebotomy was in 2016.  He has had ongoing issues with a cirrhosis.  He describes hepatic encephalopathy requiring lactulose.  Bilirubin has been chronically elevated between 5 - 7 and up to 9.4 on 12/27/2017.  Transaminases and alkaline phosphatase are normal.  He has undergone an extensive work-up at [facility].  He has never had a liver biopsy.  The patient has had anemia of unclear etiology.  EGD on 12/18/2017 by Dr. Alice Reichert revealed portal portal hypertensive gastropathy with no active bleeding.  He states that his diet is really good.  He typically has eats a breakfast burrito,  something grilled for lunch, and possibly a salad for dinner.  He is trying to lose weight.  He has lost a lot of fluid weight.  Limited right upper quadrant ultrasound on 12/26/2017 revealed echogenic and inhomogeneous liver parenchyma consistent with hepatic steatosis.  There is no focal  abnormality.  Abdominal CT scan on 01/15/2017 at [facility] revealed splenic and perigastric varices.  Spleen was normal.  CBC during this admission has included a hematocrit of 21.1 - 25.8, hemoglobin 7.4 - 9.0, platelets 96,000 - 139,000, and WBC 4600 - 12,100.  Differential is unremarkable.  Labs on 12/18/2017 revealed an iron saturation of 85% and TIBC 137.  Ferritin was 221- 245.  Review of outside labs reveals a hemoglobin 7.4 - 9.0 and platelets 96,000 -123,000 in 2019.  Ferritin was 52 on 03/29/2015, 253 on 01/16/2017, 174 on 07/20/2017.  Iron saturation was 34% on 07/20/2017, 91% on 01/16/2017, and 61% on 03/29/2015.   Past Medical History:  Diagnosis Date  . Asthma   . Cerebral palsy ([place])   . CHF (congestive heart failure) ([place])   . COPD (chronic obstructive pulmonary disease) ([place])   . Liver cirrhosis [facility])     Past Surgical History:  Procedure Laterality Date  . CHOLECYSTECTOMY    . ESOPHAGOGASTRODUODENOSCOPY N/A 12/20/2017   Procedure: ESOPHAGOGASTRODUODENOSCOPY (EGD);  Surgeon: Toledo, Benay Pike, MD;  Location: [facility] ENDOSCOPY;  Service: Gastroenterology;  Laterality: N/A;  . LEG SURGERY Bilateral     History reviewed. No pertinent family history.  Social History:  reports that he has never smoked. He has never used smokeless tobacco. He reports that he drank alcohol. He reports that he has current or past drug history.  His daughter died with cystic fibrosis.  He denies any family history of hemochromatosis.  He lives at [facility] facility.  He is alone today.  Allergies: No Known Allergies  Medications Prior to Admission  Medication Sig Dispense Refill  . diclofenac sodium (VOLTAREN) 1 % GEL Apply 4 g topically 4 (four) times daily as needed (back pain).    . hydrocortisone cream 1 % Apply 1 application topically every 6 (six) hours as needed for itching. Apply to abdomen.    . lactulose (CHRONULAC) 10 GM/15ML solution Take 45 mLs (30 g total) by mouth 3 (three) times  daily.  0  . lactulose (CHRONULAC) 10 GM/15ML solution Take 45 g by mouth 4 (four) times daily.    . midodrine (PROAMATINE) 10 MG tablet Take 1 tablet (10 mg total) by mouth 3 (three) times daily with meals. (Patient taking differently: Take 10 mg by mouth 2 (two) times daily. )    . senna (SENOKOT) 8.6 MG TABS tablet Take 2 tablets by mouth 2 (two) times daily.    Marland Kitchen spironolactone (ALDACTONE) 25 MG tablet Take 1 tablet (25 mg total) by mouth daily.    Marland Kitchen torsemide (DEMADEX) 20 MG tablet Take 2 tablets (40 mg total) by mouth daily.    Marland Kitchen XIFAXAN 550 MG TABS tablet Take 550 mg by mouth 2 (two) times daily.  0  . promethazine (PHENERGAN) 12.5 MG tablet Take 12.5 mg by mouth every 6 (six) hours as needed for nausea/vomiting.    . tuberculin (TUBERSOL) 5 UNIT/0.1ML injection Inject 0.1 mLs into the skin every 7 (seven) days.      Review of Systems: GENERAL:  Fatigue.  No fevers, sweats.  Weight loss (fluid weight). PERFORMANCE STATUS (ECOG):  3 HEENT:  Visual changes.  No runny nose, sore throat, mouth sores or tenderness. Lungs:  Shortness of breath with exertion.  No cough.  No hemoptysis. Cardiac:  CHF.  Orthopnea.  No chest pain, palpitations, or PND. GI:  Constipation.  No nausea, vomiting, diarrhea, melena or hematochezia. GU:  No urgency, frequency, dysuria, or hematuria. Musculoskeletal:  Back pain s/p fall.  No joint pain.  No muscle tenderness. Extremities:  Swelling in legs (chronic). Skin:  No rashes or skin changes. Neuro:  No headache, numbness or weakness, balance or coordination issues. Endocrine:  No diabetes, thyroid issues, hot flashes or night sweats. Psych:  No mood changes, depression or anxiety. Tearful when discussing daughter's death (years ago). Pain:  No focal pain. Review of systems:  All other systems reviewed and found to be negative.  Physical Exam:  Blood pressure (!) 116/58, pulse 99, temperature 97.8 F (36.6 C), temperature source Oral, resp. rate (!) 22,  height 5' 6"  (1.676 m), weight (!) 349 lb 4.8 oz (158.4 kg), SpO2 96 %.  GENERAL:  Well developed, well nourished, heavyset gentleman lying comfortably on the medical unit in no acute distress. MENTAL STATUS:  Alert and oriented to person, place and time. HEAD:  Short hair.  Normocephalic, atraumatic, face symmetric, no Cushingoid features. EYES:  Glasses.  Brown eyes.  Pupils equal round and reactive to light and accomodation.  No conjunctivitis or scleral icterus. ENT:  Oropharynx clear without lesion.  Tongue normal. Mucous membranes moist.  RESPIRATORY:  Clear to auscultation without rales, wheezes or rhonchi. CARDIOVASCULAR:  Regular rate and rhythm without murmur, rub or gallop. ABDOMEN:  Large abdomen.  Soft, non-tender, with active bowel sounds, and no appreciable hepatosplenomegaly.  No masses. SKIN:  Multiple upper extremity scrapes and scattered bruises.  Anasarca.  Dense edema in flanks.  No rashes, ulcers or  lesions. EXTREMITIES: Large lower extremities.  Brauny changes.  Left lower leg wrapped. LYMPH NODES: No palpable cervical, supraclavicular, or axillary adenopathy  NEUROLOGICAL: Unremarkable. PSYCH:  Appropriate.   Results for orders placed or performed during the hospital encounter of 12/26/17 (from the past 48 hour(s))  serum sodium     Status: Abnormal   Collection Time: 12/29/17  4:50 AM  Result Value Ref Range   Sodium 124 (L) 135 - 145 mmol/L    Comment: Performed at [facility], [address] [place], [place] [address]  Comprehensive metabolic panel     Status: Abnormal   Collection Time: 12/29/17  9:24 AM  Result Value Ref Range   Sodium 122 (L) 135 - 145 mmol/L   Potassium 4.4 3.5 - 5.1 mmol/L   Chloride 91 (L) 101 - 111 mmol/L   CO2 21 (L) 22 - 32 mmol/L   Glucose, Bld 114 (H) 65 - 99 mg/dL   BUN 27 (H) 6 - 20 mg/dL   Creatinine, Ser 1.29 (H) 0.61 - 1.24 mg/dL   Calcium 8.6 (L) 8.9 - 10.3 mg/dL   Total Protein 6.1 (L) 6.5 - 8.1 g/dL   Albumin  2.8 (L) 3.5 - 5.0 g/dL   AST 82 (H) 15 - 41 U/L   ALT 45 17 - 63 U/L   Alkaline Phosphatase 101 38 - 126 U/L   Total Bilirubin 6.8 (H) 0.3 - 1.2 mg/dL   GFR calc non Af Amer >60 >60 mL/min   GFR calc Af Amer >60 >60 mL/min    Comment: (NOTE) The eGFR has been calculated using the CKD EPI equation. This calculation has not been validated in all clinical situations. eGFR's persistently <60 mL/min signify possible Chronic Kidney Disease.    Anion gap 10 5 - 15    Comment: Performed at [facility], [address] [place], [place] [address]  CBC     Status: Abnormal   Collection Time: 12/29/17  9:24 AM  Result Value Ref Range   WBC 15.8 (H) 3.8 - 10.6 K/uL   RBC 2.27 (L) 4.40 - 5.90 MIL/uL   Hemoglobin 8.1 (L) 13.0 - 18.0 g/dL   HCT 23.6 (L) 40.0 - 52.0 %   MCV 103.9 (H) 80.0 - 100.0 fL   MCH 35.5 (H) 26.0 - 34.0 pg   MCHC 34.2 32.0 - 36.0 g/dL   RDW 17.1 (H) 11.5 - 14.5 %   Platelets 139 (L) 150 - 440 K/uL    Comment: Performed at [facility], [address] [place], [place] [address]  Bilirubin, direct     Status: Abnormal   Collection Time: 12/29/17  9:30 AM  Result Value Ref Range   Bilirubin, Direct 2.2 (H) 0.1 - 0.5 mg/dL    Comment: Performed at [facility], [address] [place], [place] [address]  Basic metabolic panel     Status: Abnormal   Collection Time: 12/30/17  5:06 AM  Result Value Ref Range   Sodium 124 (L) 135 - 145 mmol/L   Potassium 4.5 3.5 - 5.1 mmol/L   Chloride 91 (L) 101 - 111 mmol/L   CO2 24 22 - 32 mmol/L   Glucose, Bld 133 (H) 65 - 99 mg/dL   BUN 30 (H) 6 - 20 mg/dL   Creatinine, Ser 1.28 (H) 0.61 - 1.24 mg/dL   Calcium 8.6 (L) 8.9 - 10.3 mg/dL   GFR calc non Af Amer >60 >60 mL/min   GFR calc Af Amer >  60 >60 mL/min    Comment: (NOTE) The eGFR has been calculated using the CKD EPI equation. This calculation has not been validated in all clinical situations. eGFR's persistently <60 mL/min signify possible  Chronic Kidney Disease.    Anion gap 9 5 - 15    Comment: Performed at [facility], [address] [place], [place] [address]  CBC     Status: Abnormal   Collection Time: 12/30/17  5:06 AM  Result Value Ref Range   WBC 12.1 (H) 3.8 - 10.6 K/uL   RBC 2.11 (L) 4.40 - 5.90 MIL/uL   Hemoglobin 7.6 (L) 13.0 - 18.0 g/dL   HCT 21.8 (L) 40.0 - 52.0 %   MCV 103.8 (H) 80.0 - 100.0 fL   MCH 35.9 (H) 26.0 - 34.0 pg   MCHC 34.6 32.0 - 36.0 g/dL   RDW 17.2 (H) 11.5 - 14.5 %   Platelets 115 (L) 150 - 440 K/uL    Comment: Performed at [facility], [address] [place], [place] [address]   Dg Lumbar Spine 2-3 Views  Result Date: 12/29/2017 CLINICAL DATA:  Low back pain, worse following a fall last night. EXAM: LUMBAR SPINE - 2-3 VIEW COMPARISON:  None. FINDINGS: The examination is limited by the large size of the patient. 5 non-rib-bearing lumbar vertebrae. Minimal scoliosis. Approximately 25% L4 superior endplate compression deformity. No visible fracture lines, limited by the size of the patient. Mid and lower lumbar spine facet degenerative changes. Mild to moderate anterior spur formation at multiple levels. No pars defects. IMPRESSION: 1. Approximately 25% L4 superior endplate compression fracture, age indeterminate. 2. Multilevel degenerative changes. Electronically Signed   By: Claudie Revering M.D.   On: 12/29/2017 10:18    Assessment:  The patient is a 57 y.o.  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  Ferritin was 1108 with an iron saturation of 72% in 2013.  He was on a phlebotomy program x 3 years.   He has had no phlebotomies since 2017.  He has a macrocytic anemia likely due to underlying liver disease.  He has mild thrombocytopenia likely due to liver disease.  Last abdominal imaging revealed a normal size spleen in 2018.  EGD on 12/18/2017  revealed portal portal hypertensive gastropathy with no active bleeding. He has known splenic and perigastric varices.  Symptomatically, he is fatigued.  Hemoglobin is 7.6 with a ferritin of 245.  Plan:   1.  Hematology:  Suspect secondary iron overload.  No indication for phlebotomy at this time as patient is anemic.  Ferritin may be slightly elevated as it is an acute phase reactant (check sed rate and CRP).    Patient has anemia.  B12 was normal and folate 6.6 on 12/18/2017.  TSH was normal on 12/15/2017.  Discuss plans for anemia work-up (repeat folate; retic, DAT, free T4, LDH, haptoglobin, fractionated bilirubin).     Thank you for allowing me to participate in Antonio York 's care.  I will follow him closely with you while hospitalized and after discharge in the outpatient department.   Lequita Asal, MD  12/30/2017, 11:45 AM

## 2017-12-30 NOTE — Progress Notes (Signed)
Patient would like to go home with HHPT. I support that.  He needs daily PT and ambulation while here to get him some stronger. I've asked Nurse as well.

## 2017-12-31 DIAGNOSIS — R17 Unspecified jaundice: Secondary | ICD-10-CM

## 2017-12-31 LAB — CBC
HEMATOCRIT: 22.8 % — AB (ref 40.0–52.0)
Hemoglobin: 7.9 g/dL — ABNORMAL LOW (ref 13.0–18.0)
MCH: 36.2 pg — ABNORMAL HIGH (ref 26.0–34.0)
MCHC: 34.4 g/dL (ref 32.0–36.0)
MCV: 105 fL — ABNORMAL HIGH (ref 80.0–100.0)
PLATELETS: 109 10*3/uL — AB (ref 150–440)
RBC: 2.17 MIL/uL — ABNORMAL LOW (ref 4.40–5.90)
RDW: 17.3 % — AB (ref 11.5–14.5)
WBC: 10.7 10*3/uL — AB (ref 3.8–10.6)

## 2017-12-31 LAB — RETICULOCYTES
RBC.: 2.17 MIL/uL — ABNORMAL LOW (ref 4.40–5.90)
Retic Count, Absolute: 123.7 10*3/uL (ref 19.0–183.0)
Retic Ct Pct: 5.7 % — ABNORMAL HIGH (ref 0.4–3.1)

## 2017-12-31 LAB — BASIC METABOLIC PANEL
ANION GAP: 7 (ref 5–15)
BUN: 27 mg/dL — ABNORMAL HIGH (ref 6–20)
CALCIUM: 8.8 mg/dL — AB (ref 8.9–10.3)
CO2: 25 mmol/L (ref 22–32)
CREATININE: 1.14 mg/dL (ref 0.61–1.24)
Chloride: 94 mmol/L — ABNORMAL LOW (ref 101–111)
GFR calc non Af Amer: >60 mL/min (ref >60)
Glucose, Bld: 136 mg/dL — ABNORMAL HIGH (ref 65–99)
Potassium: 4.2 mmol/L (ref 3.5–5.1)
Sodium: 126 mmol/L — ABNORMAL LOW (ref 135–145)

## 2017-12-31 LAB — HEPATIC FUNCTION PANEL
ALBUMIN: 3.3 g/dL — AB (ref 3.5–5.0)
ALK PHOS: 113 U/L (ref 38–126)
ALT: 71 U/L — AB (ref 17–63)
AST: 103 U/L — ABNORMAL HIGH (ref 15–41)
Bilirubin, Direct: 2.2 mg/dL — ABNORMAL HIGH (ref 0.1–0.5)
Indirect Bilirubin: 4.4 mg/dL — ABNORMAL HIGH (ref 0.3–0.9)
TOTAL PROTEIN: 6.5 g/dL (ref 6.5–8.1)
Total Bilirubin: 6.6 mg/dL — ABNORMAL HIGH (ref 0.3–1.2)

## 2017-12-31 LAB — GLUCOSE, CAPILLARY: Glucose-Capillary: 139 mg/dL — ABNORMAL HIGH (ref 65–99)

## 2017-12-31 LAB — SEDIMENTATION RATE: Sed Rate: 28 mm/hr — ABNORMAL HIGH (ref 0–20)

## 2017-12-31 LAB — DAT, POLYSPECIFIC AHG (ARMC ONLY): Polyspecific AHG test: NEGATIVE

## 2017-12-31 LAB — LACTATE DEHYDROGENASE: LDH: 231 U/L — ABNORMAL HIGH (ref 98–192)

## 2017-12-31 LAB — PATHOLOGIST SMEAR REVIEW

## 2017-12-31 LAB — T4, FREE: Free T4: 0.98 ng/dL (ref 0.82–1.77)

## 2017-12-31 LAB — FOLATE: Folate: 7.4 ng/mL (ref >5.9)

## 2017-12-31 MED ORDER — FUROSEMIDE 10 MG/ML IJ SOLN
80.0000 mg | Freq: Once | INTRAMUSCULAR | Status: AC
  Administered 2017-12-31: 80 mg via INTRAVENOUS
  Filled 2017-12-31: qty 8

## 2017-12-31 MED ORDER — FUROSEMIDE 10 MG/ML IJ SOLN
10.0000 mg/h | INTRAVENOUS | Status: DC
  Administered 2017-12-31: 10 mg/h via INTRAVENOUS
  Filled 2017-12-31: qty 25

## 2017-12-31 NOTE — Progress Notes (Signed)
[facility]  ROUNDING NOTE   Subjective:  Serum sodium slowly improving. Sodium up to 126 this a.m. Patient just taken off lasix gtt.    Objective:  Vital signs in last 24 hours:  Temp:  [97.4 F (36.3 C)-97.8 F (36.6 C)] 97.8 F (36.6 C) (05/28 1226) Pulse Rate:  [85-93] 90 (05/28 1226) Resp:  [19-22] 20 (05/28 1226) BP: (106-128)/(47-65) 128/57 (05/28 1226) SpO2:  [95 %-99 %] 96 % (05/28 1226) Weight:  [156.6 kg (345 lb 4.8 oz)] 156.6 kg (345 lb 4.8 oz) (05/28 0451)  Weight change: -1.814 kg (-4 lb) Filed Weights   12/26/17 1448 12/30/17 0457 12/31/17 0451  Weight: (!) 158.8 kg (350 lb) (!) 158.4 kg (349 lb 4.8 oz) (!) 156.6 kg (345 lb 4.8 oz)    Intake/Output: I/O last 3 completed shifts: In: 248.5 [I.V.:98.5; IV Piggyback:150] Out: 1650 [Urine:1650]   Intake/Output this shift:  Total I/O In: -  Out: 450 [Urine:450]  Physical Exam: General: NAD, laying in bed  Head: Normocephalic, atraumatic. Moist oral mucosal membranes  Eyes: Icterus noted  Neck: Supple, trachea midline  Lungs:  Clear to auscultation, normal effort  Heart: Regular rate and rhythm  Abdomen:  Obese   Extremities: trace peripheral edema.  Neurologic: Nonfocal, moving all four extremities  Skin: No lesions       Basic Metabolic Panel: Recent Labs  Lab 12/28/17 0511 12/28/17 1121 12/29/17 0450 12/29/17 0924 12/30/17 0506 12/31/17 0516  NA 126* 124* 124* 122* 124* 126*  K 4.5 4.2  --  4.4 4.5 4.2  CL 94* 93*  --  91* 91* 94*  CO2 24 23  --  21* 24 25  GLUCOSE 131* 155*  --  114* 133* 136*  BUN 19 22*  --  27* 30* 27*  CREATININE 1.16 1.18  --  1.29* 1.28* 1.14  CALCIUM 8.3* 8.3*  --  8.6* 8.6* 8.8*    Liver Function Tests: Recent Labs  Lab 12/26/17 1501 12/27/17 0906 12/29/17 0924 12/31/17 0516  AST 83* 70* 82* 103*  ALT 39 39 45 71*  ALKPHOS 115 101 101 113  BILITOT 8.5* 9.4* 6.8* 6.6*  PROT 6.2* 6.1* 6.1* 6.5  ALBUMIN 2.9* 2.8* 2.8* 3.3*   Recent Labs   Lab 12/26/17 1501  LIPASE 30   Recent Labs  Lab 12/26/17 1501 12/27/17 0906  AMMONIA 109* 73*    CBC: Recent Labs  Lab 12/26/17 1501 12/27/17 0906 12/28/17 0511 12/29/17 0924 12/30/17 0506 12/31/17 0516  WBC 8.5 9.0 11.1* 15.8* 12.1* 10.7*  NEUTROABS 6.6*  --  9.8*  --   --   --   HGB 9.0* 8.9* 8.3* 8.1* 7.6* 7.9*  HCT 25.8* 25.6* 24.3* 23.6* 21.8* 22.8*  MCV 103.3* 103.9* 103.1* 103.9* 103.8* 105.0*  PLT 139* 119* 116* 139* 115* 109*    Cardiac Enzymes: No results for input(s): CKTOTAL, CKMB, CKMBINDEX, TROPONINI in the last 168 hours.  BNP: Invalid input(s): POCBNP  CBG: Recent Labs  Lab 12/31/17 0737  GLUCAP 139*    Microbiology: Results for orders placed or performed during the hospital encounter of 12/26/17  Urine culture     Status: Abnormal   Collection Time: 12/29/17 11:55 AM  Result Value Ref Range Status   Specimen Description   Final    URINE, CLEAN CATCH Performed at [facility], [address] [place], [place] [address]    Special Requests   Final    NONE Performed at [facility], [address] [place], [place] [address]    Culture MULTIPLE SPECIES PRESENT, SUGGEST RECOLLECTION (A)  Final   Report Status 12/30/2017 FINAL  Final    Coagulation Studies: No results for input(s): LABPROT, INR in the last 72 hours.  Urinalysis: No results for input(s): COLORURINE, LABSPEC, PHURINE, GLUCOSEU, HGBUR, BILIRUBINUR, KETONESUR, PROTEINUR, UROBILINOGEN, NITRITE, LEUKOCYTESUR in the last 72 hours.  Invalid input(s): APPERANCEUR    Imaging: No results found.   Medications:   . albumin human    . furosemide (LASIX) infusion Stopped (12/31/17 1325)   . dextromethorphan  30 mg Oral BID  . docusate sodium  100 mg Oral BID  . guaiFENesin  600 mg Oral BID  . heparin  5,000 Units Subcutaneous Q8H  . lactulose  30 g Oral TID  . mouth rinse  15 mL Mouth Rinse BID  . midodrine  10 mg Oral TID WC  . nystatin   Topical  BID  . pantoprazole  40 mg Oral Daily  . predniSONE  50 mg Oral Q breakfast  . rifaximin  550 mg Oral BID  . senna  2 tablet Oral BID  . spironolactone  25 mg Oral Daily   acetaminophen **OR** acetaminophen, bisacodyl, guaiFENesin-dextromethorphan, hydrocortisone cream, ipratropium-albuterol, lip balm, morphine injection, ondansetron **OR** ondansetron (ZOFRAN) IV, sodium chloride flush  Assessment/ Plan:  [name] is a 57 y.o. white male with cerebral palsy, diastolic heart failure, liver cirrhosis secondary to nonalcoholic fatty liver disease/hemochromatosis, lower extremity edema, generalized debility, who was admitted to [facility] on 12/14/2017 for evaluation of confusion.   1.  Hyponatremia. 2.  Hypotension. 3.  Liver cirrhosis secondary to [place]/hemochromatosis. MELD score on 5/14 of 30 4.  Anemia unspecified.  Plan: Consistent with hypervolemic hypo-osmolar hyponatremia secondary to hepatic cirrhosis - Patient was taken off of Lasix drip.  We will administer furosemide 80 mg IV today and continue to monitor serum sodium.  Otherwise maintain the patient on spironolactone 25 mg daily as well as midodrine 10 mg p.o. 3 times daily.  We will continue to monitor his progress.    LOS: 5 Nayomi Tabron 5/28/20192:06 PM

## 2017-12-31 NOTE — Progress Notes (Signed)
1        [employer] - [place] at [facility]   PATIENT NAME: Antonio York    MR#:  [id number]  DATE OF BIRTH:  09-15-1960  SUBJECTIVE:  CHIEF COMPLAINT:   Chief Complaint  Patient presents with  . Abdominal Pain  agreeable to go to rehab now which his wife really wants him to as she cannot take care of him anymore REVIEW OF SYSTEMS:  Review of Systems  Constitutional: Negative for chills, fever and weight loss.  HENT: Negative for nosebleeds and sore throat.   Eyes: Negative for blurred vision.  Respiratory: Negative for cough, shortness of breath and wheezing.   Cardiovascular: Negative for chest pain, orthopnea, leg swelling and PND.  Gastrointestinal: Negative for abdominal pain, constipation, diarrhea, heartburn, nausea and vomiting.  Genitourinary: Negative for dysuria and urgency.  Musculoskeletal: Positive for back pain and falls.  Skin: Negative for rash.  Neurological: Negative for dizziness, speech change, focal weakness and headaches.  Endo/Heme/Allergies: Does not bruise/bleed easily.  Psychiatric/Behavioral: Negative for depression.    DRUG ALLERGIES:  No Known Allergies VITALS:  Blood pressure (!) 128/57, pulse 90, temperature 97.8 F (36.6 C), temperature source Oral, resp. rate 20, height  (1.676 m), weight (!) 156.6 kg (345 lb 4.8 oz), SpO2 96 %. PHYSICAL EXAMINATION:  Physical Exam  Constitutional: He is oriented to person, place, and time. He appears well-developed.  HENT:  Head: Normocephalic and atraumatic.  Eyes: Pupils are equal, round, and reactive to light. Conjunctivae and EOM are normal.  Neck: Normal range of motion. Neck supple. No tracheal deviation present. No thyromegaly present.  Cardiovascular: Normal rate, regular rhythm and normal heart sounds.  Pulmonary/Chest: Effort normal and breath sounds normal. No respiratory distress. He has no wheezes. He exhibits no tenderness.  Abdominal: Soft. Bowel sounds are normal. He  exhibits no distension. There is no tenderness.  Musculoskeletal: Normal range of motion. He exhibits edema.  Neurological: He is alert and oriented to person, place, and time. No cranial nerve deficit.  Skin: Skin is warm and dry. No rash noted.   LABORATORY PANEL:  Male CBC Recent Labs  Lab 12/31/17 0516  WBC 10.7*  HGB 7.9*  HCT 22.8*  PLT 109*   ------------------------------------------------------------------------------------------------------------------ Chemistries  Recent Labs  Lab 12/31/17 0516  NA 126*  K 4.2  CL 94*  CO2 25  GLUCOSE 136*  BUN 27*  CREATININE 1.14  CALCIUM 8.8*  AST 103*  ALT 71*  ALKPHOS 113  BILITOT 6.6*   RADIOLOGY:  No results found. ASSESSMENT AND PLAN:   57 year old male patient with multiple medical problems of all nonalcoholic liver cirrhosis, hereditary hemochromatosis, morbid obesity, COPD, multiple recurrent admissions admitted third time this month, signed out AMA last night, brought back to the room in wheelchair without readmitting , now being admitted.  1.  Acute hyponatremia in the context of liver cirrhosis: Sodium 126, continue Lasix drip, Midodrin for better blood pressure support, also on albumin.  Monitor sodium  2.  Nonalcoholic liver cirrhosis, hereditary hemochromatosis, slight worsening of jaundice, Dr. Maximino Greenland recommendeds MRCP as an outpatient likely at [facility] but because patient is obese he is not able to fit in the MRI machine here. liver function trending up.  Patient can follow-up with [facility] liver clinic for routine follow-ups.  Patient baseline bilirubin is around 6.  3.  Anasarca:  Lasix drip, add Midodrin for better blood pressure support, also on albumin. -1.5 L  4. L4 superior endplate compression fracture  status post fall, patient fell in parking lot and landed landed on his back but he denies acute back pain.  neurosurgery - conservative management  5.  Deconditioning: Patient is now agreeable to go  to skilled nursing facility as his wife, they chose [facility] in [place]  6.  GERD, continue Protonix  7.  Chronic hypotension: on low-dose Midodrin. Blood pressure stable.       All the records are reviewed and case discussed with Care Management/Social Worker. Management plans discussed with the patient, family (wife over phone) and they are in agreement.  CODE STATUS: DNR  TOTAL TIME TAKING CARE OF THIS PATIENT: 35 minutes.   More than 50% of the time was spent in counseling/coordination of care: YES  POSSIBLE D/C IN 1-2 DAYS, DEPENDING ON CLINICAL CONDITION.   Delfino Lovett M.D on 12/31/2017 at 5:35 PM  Between 7am to 6pm - Pager - [phone]  After 6pm go to [email or web address] - Social research officer, government  [facility] [place] [facility]  Office  [phone]  CC: Primary care physician; Dimple Casey, MD  Note: This dictation was prepared with Dragon dictation along with smaller phrase technology. Any transcriptional errors that result from this process are unintentional.

## 2017-12-31 NOTE — Progress Notes (Signed)
Requested unit coordinator to order Prevalon boots from central supply. Heels elevated.

## 2017-12-31 NOTE — Progress Notes (Signed)
Antonio [name], MD [facility]   [address] [place], [place] [address] Phone: [phone] Fax : [phone]   Subjective: This patient has a history of chronic liver disease with a self reported history of hemachromatosis.  The patient was seen for elevated bilirubin that has that has been chronically elevated.   Objective: Vital signs in last 24 hours: Vitals:   12/30/17 1947 12/31/17 0451 12/31/17 0853 12/31/17 1226  BP: (!) 106/49 119/62 126/65 (!) 128/57  Pulse: 85 93 93 90  Resp: 20 (!) Temp: (!) 97.5 F (36.4 C) 97.7 F (36.5 C) 97.6 F (36.4 C) 97.8 F (36.6 C)  TempSrc: Oral  Axillary Oral  SpO2: 97% 95% 99% 96%  Weight:  (!) 345 lb 4.8 oz (156.6 kg)    Height:       Weight change: -4 lb (-1.814 kg)  Intake/Output Summary (Last 24 hours) at 12/31/2017 1915 Last data filed at 12/31/2017 1854 Gross per 24 hour  Intake 432.17 ml  Output 2250 ml  Net -1817.83 ml     Exam: Heart:: Regular rate and rhythm, S1S2 present or without murmur or extra heart sounds Lungs: normal and clear to auscultation and percussion Abdomen: soft, nontender, normal bowel sounds   Lab Results: @ Micro Results: Recent Results (from the past 240 hour(s))  Urine culture     Status: Abnormal   Collection Time: 12/29/17 11:55 AM  Result Value Ref Range Status   Specimen Description   Final    URINE, CLEAN CATCH Performed at [facility], [address] [place], [place] [address]    Special Requests   Final    NONE Performed at [facility], [address], [place], [place] [address]    Culture MULTIPLE SPECIES PRESENT, SUGGEST RECOLLECTION (A)  Final   Report Status 12/30/2017 FINAL  Final   Studies/Results: No results found. Medications:  Prior to Admission:  Medications Prior to Admission  Medication Sig Dispense Refill Last Dose  . diclofenac sodium (VOLTAREN) 1 % GEL Apply 4 g topically 4 (four) times daily as needed (back  pain).     . hydrocortisone cream 1 % Apply 1 application topically every 6 (six) hours as needed for itching. Apply to abdomen.   12/26/2017 at Unknown time  . lactulose (CHRONULAC) 10 GM/15ML solution Take 45 mLs (30 g total) by mouth 3 (three) times daily.  0 12/26/2017 at Unknown time  . lactulose (CHRONULAC) 10 GM/15ML solution Take 45 g by mouth 4 (four) times daily.   12/26/2017 at Unknown time  . midodrine (PROAMATINE) 10 MG tablet Take 1 tablet (10 mg total) by mouth 3 (three) times daily with meals. (Patient taking differently: Take 10 mg by mouth 2 (two) times daily. )   12/26/2017 at Unknown time  . senna (SENOKOT) 8.6 MG TABS tablet Take 2 tablets by mouth 2 (two) times daily.   12/26/2017 at Unknown time  . spironolactone (ALDACTONE) 25 MG tablet Take 1 tablet (25 mg total) by mouth daily.   12/26/2017 at Unknown time  . torsemide (DEMADEX) 20 MG tablet Take 2 tablets (40 mg total) by mouth daily.   12/26/2017 at Unknown time  . XIFAXAN 550 MG TABS tablet Take 550 mg by mouth 2 (two) times daily.  0 12/26/2017 at Unknown time  . promethazine (PHENERGAN) 12.5 MG tablet Take 12.5 mg by mouth every 6 (six) hours as needed for nausea/vomiting.   prn at prn  . tuberculin (TUBERSOL) 5  UNIT/0.1ML injection Inject 0.1 mLs into the skin every 7 (seven) days.      Scheduled Meds: . dextromethorphan  30 mg Oral BID  . docusate sodium  100 mg Oral BID  . guaiFENesin  600 mg Oral BID  . heparin  5,000 Units Subcutaneous Q8H  . lactulose  30 g Oral TID  . mouth rinse  15 mL Mouth Rinse BID  . midodrine  10 mg Oral TID WC  . nystatin   Topical BID  . pantoprazole  40 mg Oral Daily  . predniSONE  50 mg Oral Q breakfast  . rifaximin  550 mg Oral BID  . senna  2 tablet Oral BID  . spironolactone  25 mg Oral Daily   Continuous Infusions: . albumin human     PRN Meds:.acetaminophen **OR** acetaminophen, bisacodyl, guaiFENesin-dextromethorphan, hydrocortisone cream, ipratropium-albuterol, lip balm,  morphine injection, ondansetron **OR** ondansetron (ZOFRAN) IV, sodium chloride flush   Assessment: Principal Problem:   Adjustment disorder with disturbance of emotion Active Problems:   Hyponatremia   Hepatic encephalopathy (HCC)   Pressure injury of skin    Plan: This patient has chronic liver disease with a self-reported history of hemachromatosis.  The patient's liver enzymes have been elevated chronically.  The patient is followed at [facility].  He had a upper endoscopy by Dr. Norma Fredrickson on the 17th of this month.  Nothing further to do from a GI point of view.  The patient should follow up at [facility] liver clinic.  The patient is followed by Dr. Piedad Climes   LOS: 5 days   Antonio York 12/31/2017, 7:15 PM

## 2017-12-31 NOTE — Clinical Social Work Note (Signed)
CSW notified by RN that patient requested to see CSW. CSW met with patient and he wanted to make sure that everyone is on the same page. Patient states that he is not refusing to go to rehab but wants to go to a rehab that is closer to his home in [place]. CSW explained to patient that we had this issue on his last admission as well and facilities closer to his home did not have availability for him. CSW explained that referrals have been sent to facilities closer to his home and we are waiting for decisions from them. CSW also explained that if we are unable to find a facility closer to home that he would have to choose a facility that is available which includes [facility]. Patient states that he understands but wants to make sure someone is actually looking for facilities closer to his home. CSW assured him that is being done and CSW will follow up with him once we hear back. Patient thanked CSW for assistance. CSW will continue to follow patient for discharge planning needs.   Annamaria Boots MSW, [place] [phone]

## 2017-12-31 NOTE — Clinical Social Work Note (Signed)
Patient's wife contacted CSW to ask about placement for patient. CSW explained that the only two facilities that can take patient are [facility] [place] and [facility]. Wife asked about facilities closer to [place]. CSW explained to wife that due to patient's size and medical needs, no other facilities can take him. Wife asked if patient will be willing to go to a facility. CSW explained that it is patient's decision and he can not be forced to go if he is not willing. Wife thanked CSW for assistance.   CSW spoke with patient regarding bed offers. CSW explained that the only choices for SNF are [facility] [place] or [facility]. Patient again asked about facilities closer to his home. CSW explained that all of the facilities that are closer to his home are not able to take him. Patient states that he understands. Patient states that he would like to go to [facility] [place]. CSW notified Rayfield Citizen, liaison for [facility] [place] that patient would like to accept bed offer. Rayfield Citizen states that they will begin [facility] auth for possible admission tomorrow. CSW will continue to follow for discharge needs.   Ruthe Mannan MSW, [address] [phone]

## 2018-01-01 LAB — CBC
HCT: 22.5 % — ABNORMAL LOW (ref 40.0–52.0)
HEMOGLOBIN: 7.6 g/dL — AB (ref 13.0–18.0)
MCH: 35.5 pg — AB (ref 26.0–34.0)
MCHC: 33.7 g/dL (ref 32.0–36.0)
MCV: 105.5 fL — ABNORMAL HIGH (ref 80.0–100.0)
Platelets: 114 10*3/uL — ABNORMAL LOW (ref 150–440)
RBC: 2.13 MIL/uL — ABNORMAL LOW (ref 4.40–5.90)
RDW: 17.8 % — ABNORMAL HIGH (ref 11.5–14.5)
WBC: 13.6 10*3/uL — ABNORMAL HIGH (ref 3.8–10.6)

## 2018-01-01 LAB — HIGH SENSITIVITY CRP: CRP HIGH SENSITIVITY: 11.59 mg/L — AB (ref 0.00–3.00)

## 2018-01-01 LAB — BASIC METABOLIC PANEL
Anion gap: 9 (ref 5–15)
BUN: 29 mg/dL — AB (ref 6–20)
CHLORIDE: 94 mmol/L — AB (ref 101–111)
CO2: 23 mmol/L (ref 22–32)
CREATININE: 1.19 mg/dL (ref 0.61–1.24)
Calcium: 8.5 mg/dL — ABNORMAL LOW (ref 8.9–10.3)
GFR calc Af Amer: >60 mL/min (ref >60)
GFR calc non Af Amer: >60 mL/min (ref >60)
GLUCOSE: 106 mg/dL — AB (ref 65–99)
Potassium: 3.8 mmol/L (ref 3.5–5.1)
SODIUM: 126 mmol/L — AB (ref 135–145)

## 2018-01-01 LAB — HAPTOGLOBIN: Haptoglobin: <10 mg/dL — ABNORMAL LOW (ref 34–200)

## 2018-01-01 LAB — GLUCOSE, CAPILLARY: GLUCOSE-CAPILLARY: 109 mg/dL — AB (ref 65–99)

## 2018-01-01 MED ORDER — FUROSEMIDE 10 MG/ML IJ SOLN
6.0000 mg/h | INTRAVENOUS | Status: DC
  Administered 2018-01-01 – 2018-01-04 (×3): 6 mg/h via INTRAVENOUS
  Filled 2018-01-01 (×2): qty 25

## 2018-01-01 MED ORDER — OXYCODONE-ACETAMINOPHEN 5-325 MG PO TABS
1.0000 | ORAL_TABLET | Freq: Four times a day (QID) | ORAL | Status: DC | PRN
  Administered 2018-01-01 – 2018-01-06 (×4): 1 via ORAL
  Filled 2018-01-01 (×4): qty 1

## 2018-01-01 MED ORDER — SODIUM CHLORIDE 1 G PO TABS
1.0000 g | ORAL_TABLET | Freq: Two times a day (BID) | ORAL | Status: DC
  Administered 2018-01-01 – 2018-01-06 (×10): 1 g via ORAL
  Filled 2018-01-01 (×11): qty 1

## 2018-01-01 NOTE — Progress Notes (Signed)
[facility]  ROUNDING NOTE   Subjective:  Patient resting comfortably in bed at the moment. Serum sodium remains low and stable at 126 which is not far off from his baseline.    Objective:  Vital signs in last 24 hours:  Temp:  [97.8 F (36.6 C)-98.6 F (37 C)] 98.6 F (37 C) (05/29 0355) Pulse Rate:  [86-92] 92 (05/29 0355) Resp:  [17-20] 18 (05/29 0355) BP: (108-128)/(42-57) 109/52 (05/29 0355) SpO2:  [94 %-98 %] 94 % (05/29 0355)  Weight change:  Filed Weights   12/26/17 1448 12/30/17 0457 12/31/17 0451  Weight: (!) 158.8 kg (350 lb) (!) 158.4 kg (349 lb 4.8 oz) (!) 156.6 kg (345 lb 4.8 oz)    Intake/Output: I/O last 3 completed shifts: In: 432.2 [P.O.:320; I.V.:62.2; IV Piggyback:50] Out: 3450 [Urine:3450]   Intake/Output this shift:  No intake/output data recorded.  Physical Exam: General: NAD, laying in bed  Head: Normocephalic, atraumatic. Moist oral mucosal membranes  Eyes: Icterus noted  Neck: Supple, trachea midline  Lungs:  Clear to auscultation, normal effort  Heart: Regular rate and rhythm  Abdomen:  Obese   Extremities: 1+ brawny peripheral edema.  Neurologic: Nonfocal, moving all four extremities  Skin: No lesions       Basic Metabolic Panel: Recent Labs  Lab 12/28/17 1121 12/29/17 0450 12/29/17 0924 12/30/17 0506 12/31/17 0516 01/01/18 0855  NA 124* 124* 122* 124* 126* 126*  K 4.2  --  4.4 4.5 4.2 3.8  CL 93*  --  91* 91* 94* 94*  CO2 23  --  21* GLUCOSE 155*  --  114* 133* 136* 106*  BUN 22*  --  27* 30* 27* 29*  CREATININE 1.18  --  1.29* 1.28* 1.14 1.19  CALCIUM 8.3*  --  8.6* 8.6* 8.8* 8.5*    Liver Function Tests: Recent Labs  Lab 12/26/17 1501 12/27/17 0906 12/29/17 0924 12/31/17 0516  AST 83* 70* 82* 103*  ALT 39 39 45 71*  ALKPHOS 115 101 101 113  BILITOT 8.5* 9.4* 6.8* 6.6*  PROT 6.2* 6.1* 6.1* 6.5  ALBUMIN 2.9* 2.8* 2.8* 3.3*   Recent Labs  Lab 12/26/17 1501  LIPASE 30   Recent Labs   Lab 12/26/17 1501 12/27/17 0906  AMMONIA 109* 73*    CBC: Recent Labs  Lab 12/26/17 1501  12/28/17 0511 12/29/17 0924 12/30/17 0506 12/31/17 0516 01/01/18 0855  WBC 8.5   < > 11.1* 15.8* 12.1* 10.7* 13.6*  NEUTROABS 6.6*  --  9.8*  --   --   --   --   HGB 9.0*   < > 8.3* 8.1* 7.6* 7.9* 7.6*  HCT 25.8*   < > 24.3* 23.6* 21.8* 22.8* 22.5*  MCV 103.3*   < > 103.1* 103.9* 103.8* 105.0* 105.5*  PLT 139*   < > 116* 139* 115* 109* 114*   < > = values in this interval not displayed.    Cardiac Enzymes: No results for input(s): CKTOTAL, CKMB, CKMBINDEX, TROPONINI in the last 168 hours.  BNP: Invalid input(s): POCBNP  CBG: Recent Labs  Lab 12/31/17 0737 01/01/18 0742  GLUCAP 139* 109*    Microbiology: Results for orders placed or performed during the hospital encounter of 12/26/17  Urine culture     Status: Abnormal   Collection Time: 12/29/17 11:55 AM  Result Value Ref Range Status   Specimen Description   Final    URINE, CLEAN CATCH Performed at [facility], [address] [place], [place] [address]    Special Requests   Final    NONE Performed at [facility], [address] [place], [place] [address]    Culture MULTIPLE SPECIES PRESENT, SUGGEST RECOLLECTION (A)  Final   Report Status 12/30/2017 FINAL  Final    Coagulation Studies: No results for input(s): LABPROT, INR in the last 72 hours.  Urinalysis: No results for input(s): COLORURINE, LABSPEC, PHURINE, GLUCOSEU, HGBUR, BILIRUBINUR, KETONESUR, PROTEINUR, UROBILINOGEN, NITRITE, LEUKOCYTESUR in the last 72 hours.  Invalid input(s): APPERANCEUR    Imaging: No results found.   Medications:   . albumin human 12.5 g (01/01/18 0651)   . dextromethorphan  30 mg Oral BID  . docusate sodium  100 mg Oral BID  . guaiFENesin  600 mg Oral BID  . heparin  5,000 Units Subcutaneous Q8H  . lactulose  30 g Oral TID  . mouth rinse  15 mL Mouth Rinse BID  . midodrine  10 mg Oral TID WC   . nystatin   Topical BID  . pantoprazole  40 mg Oral Daily  . predniSONE  50 mg Oral Q breakfast  . rifaximin  550 mg Oral BID  . senna  2 tablet Oral BID  . spironolactone  25 mg Oral Daily   acetaminophen **OR** acetaminophen, bisacodyl, guaiFENesin-dextromethorphan, hydrocortisone cream, ipratropium-albuterol, lip balm, morphine injection, ondansetron **OR** ondansetron (ZOFRAN) IV, oxyCODONE-acetaminophen, sodium chloride flush  Assessment/ Plan:  Antonio York is a 57 y.o. 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   1.  Hyponatremia.  2.  Hypotension. 3.  Liver cirrhosis secondary to [place]/hemochromatosis. MELD score on 5/14 of 30 4.  Anemia unspecified.  Plan: Consistent with hypervolemic hypo-osmolar hyponatremia secondary to hepatic cirrhosis -Patient continues to have difficult to treat hyponatremia.  We will reinitiate Lasix infusion at this time and also add salt tablets 1 g p.o. twice daily.  Patient will also continue on spironolactone.  Maintain the patient on midodrine for relative hypotension.  This issue will likely be difficult to treat as an outpatient as well.  Further plan as patient progresses.    LOS: 6 Erasmo Vertz 5/29/201912:10 PM

## 2018-01-01 NOTE — Progress Notes (Signed)
1        [employer] - [place] at [facility]   PATIENT NAME: Ewell Benassi    MR#:  [id number]  DATE OF BIRTH:  05/14/1961  SUBJECTIVE:  CHIEF COMPLAINT:   Chief Complaint  Patient presents with  . Abdominal Pain  in bed, no complaints REVIEW OF SYSTEMS:  Review of Systems  Constitutional: Negative for chills, fever and weight loss.  HENT: Negative for nosebleeds and sore throat.   Eyes: Negative for blurred vision.  Respiratory: Negative for cough, shortness of breath and wheezing.   Cardiovascular: Negative for chest pain, orthopnea, leg swelling and PND.  Gastrointestinal: Negative for abdominal pain, constipation, diarrhea, heartburn, nausea and vomiting.  Genitourinary: Negative for dysuria and urgency.  Musculoskeletal: Positive for back pain and falls.  Skin: Negative for rash.  Neurological: Negative for dizziness, speech change, focal weakness and headaches.  Endo/Heme/Allergies: Does not bruise/bleed easily.  Psychiatric/Behavioral: Negative for depression.   DRUG ALLERGIES:  No Known Allergies VITALS:  Blood pressure (!) 109/52, pulse 92, temperature 98.6 F (37 C), temperature source Oral, resp. rate 18, height  (1.676 m), weight (!) 156.6 kg (345 lb 4.8 oz), SpO2 94 %. PHYSICAL EXAMINATION:  Physical Exam  Constitutional: He is oriented to person, place, and time. He appears well-developed.  HENT:  Head: Normocephalic and atraumatic.  Eyes: Pupils are equal, round, and reactive to light. Conjunctivae and EOM are normal.  Neck: Normal range of motion. Neck supple. No tracheal deviation present. No thyromegaly present.  Cardiovascular: Normal rate, regular rhythm and normal heart sounds.  Pulmonary/Chest: Effort normal and breath sounds normal. No respiratory distress. He has no wheezes. He exhibits no tenderness.  Abdominal: Soft. Bowel sounds are normal. He exhibits no distension. There is no tenderness.  Musculoskeletal: Normal range of  motion. He exhibits edema.  Neurological: He is alert and oriented to person, place, and time. No cranial nerve deficit.  Skin: Skin is warm and dry. No rash noted.   LABORATORY PANEL:  Male CBC Recent Labs  Lab 01/01/18 0855  WBC 13.6*  HGB 7.6*  HCT 22.5*  PLT 114*   ------------------------------------------------------------------------------------------------------------------ Chemistries  Recent Labs  Lab 12/31/17 0516 01/01/18 0855  NA 126* 126*  K 4.2 3.8  CL 94* 94*  CO2 25 23  GLUCOSE 136* 106*  BUN 27* 29*  CREATININE 1.14 1.19  CALCIUM 8.8* 8.5*  AST 103*  --   ALT 71*  --   ALKPHOS 113  --   BILITOT 6.6*  --    RADIOLOGY:  No results found. ASSESSMENT AND PLAN:  57 year old male patient with multiple medical problems of all nonalcoholic liver cirrhosis, hereditary hemochromatosis, morbid obesity, COPD, multiple recurrent admissions admitted third time this month, signed out AMA last night, brought back to the room in wheelchair without readmitting , now being admitted.  1. Acute hypervolemic hypo-osmolar hyponatremia secondary to hepatic cirrhosis - Sodium remains at 126, continue Lasix drip, Midodrin for better blood pressure support, also on albumin.  Monitor sodium -Nephrology following  2.  Nonalcoholic liver cirrhosis, hereditary hemochromatosis, slight worsening of jaundice, Dr. Maximino Greenland recommendeds MRCP as an outpatient [facility] at [facility] but because patient is obese he is not able to fit in the MRI machine here. liver function trending up.  Patient can follow-up with [facility] liver clinic for routine follow-ups.  Patient baseline bilirubin is around 6.  3.  Anasarca:  Lasix drip, Midodrin for better blood pressure support, also on albumin. -1.5 L  4. L4 superior endplate compression fracture status post fall, patient fell in parking lot and landed landed on his back but he denies acute back pain.  neurosurgery - conservative management  5.   Deconditioning: Patient is now agreeable to go to skilled nursing facility as his wife, they chose [facility] in [place]  6.  GERD, continue Protonix  7.  Chronic hypotension: on low-dose Midodrin. Blood pressure stable.  8.  Anemia of chronic disease: Hemoglobin 8.9    PerPhysical therapy initial note, if he chooses to return home, he will require a hospital bed, Lift system (hoyer type), bariatric bedside commode, bariatric walker and gait belt for progression of mobility. He will need +2 -+3 at this time for safe transfers with lift system. He is unable to stand with a walker at this time. Pt was unrealistic in his mobility status at start of session but had a better understanding as session progressed. Family/friends in upon arrival and did not support him returning home so it is unclear how much assist they would be to him (Wife not in attendance). HHPT services would be limited to typically 2-3 times per week but given assist level, their ability to progress standing mobility may be limited as he requires extensive assist currently.   He will likely require short-term rehab  All the records are reviewed and case discussed with Care Management/Social Worker. Management plans discussed with the patient and they are in agreement.  CODE STATUS: Full Code  TOTAL TIME TAKING CARE OF THIS PATIENT: 35 minutes.   More than 50% of the time was spent in counseling/coordination of care: YES  POSSIBLE D/C IN 2-3 DAYS, DEPENDING ON CLINICAL CONDITION.   Delfino Lovett M.D on 01/01/2018 at 4:13 PM  Between 7am to 6pm - Pager - [phone]  After 6pm go to [email or web address] - Social research officer, government  [facility] [place] [facility]  Office  [phone]  CC: Primary care physician; Dimple Casey, MD  Note: This dictation was prepared with Dragon dictation along with smaller phrase technology. Any transcriptional errors that result from this process are unintentional.

## 2018-01-01 NOTE — Care Management Important Message (Signed)
Important Message  Patient Details  Name: Antonio York MRN: [id number] Date of Birth: 04/12/1961   Medicare Important Message Given:  Yes    Yolani Vo A, RN 01/01/2018, 10:07 AM

## 2018-01-01 NOTE — Progress Notes (Signed)
Physical Therapy Treatment Patient Details Name: Antonio York MRN: [id number] DOB: [birth year] Today's Date: 01/01/2018    History of Present Illness 57 yo male with 3rd admission this month has new AMS with encephalopathy of hepatic origin due to not receiving lactulose in SNF.  Recent fall with sutured LLE laceration, low Na+  and disuse atrophy of mm's.  PMHx:  encephalopathy, NASH cirrhosis, CP, CHF, morbid obesity, asthma, COPD, hemochromatosis    PT Comments    Pt very lethargic awakening to voice/touch after several attempts. Agreeable to PT. Pt does require constant reawakening throughout exercise to stay on task. Pt reports pain in abdomen; 6/10 via faces scale that worsens with assisted sitting. Pt requires physical assist for movement exercises. Max A of 2 to total assist for bed mobility with brief period of assisted sitting; greater dyspnea with sit and poor tolerance overall. Pt encouraged to perform exercises to the best of his ability often. Continue PT to progress participation, endurance, strength to allow for improved pt assist with functional mobility.    Follow Up Recommendations  SNF     Equipment Recommendations       Recommendations for Other Services       Precautions / Restrictions Precautions Precautions: Fall Restrictions Weight Bearing Restrictions: No    Mobility  Bed Mobility Overal bed mobility: Needs Assistance Bed Mobility: Supine to Sit;Sit to Supine     Supine to sit: Max assist;Total assist;+2 for physical assistance;HOB elevated Sit to supine: Max assist;Total assist;+2 for physical assistance   General bed mobility comments: Pt able to assist with glut squeeze to mobilize hips towards edge of bed with consistent cueing. Overall little to no assist for bed mobility and supine to/from sit transfers. Unable to maintain sit without Max A  Transfers                 General transfer comment: unable to maintain sit; therefore not  tested  Ambulation/Gait                 Stairs             Wheelchair Mobility    Modified Rankin (Stroke Patients Only)       Balance Overall balance assessment: Needs assistance;History of Falls Sitting-balance support: Bilateral upper extremity supported Sitting balance-Leahy Scale: Zero Sitting balance - Comments: Unable to get feet to the floor with assisted scooting. Pt also at risk of sliding out of bed even with air mattress off due to heavy posterior lean, zero trunk control and short statured LEs. Would benefit from bed that transitions to sit position Postural control: Posterior lean                                  Cognition Arousal/Alertness: Lethargic Behavior During Therapy: WFL for tasks assessed/performed Overall Cognitive Status: Within Functional Limits for tasks assessed                                 General Comments: Difficulty remaining awake with exercise; low effort with bed mobility      Exercises General Exercises - Lower Extremity Ankle Circles/Pumps: AROM;Both;10 reps;Supine Quad Sets: Strengthening;Both;10 reps;Supine Gluteal Sets: Strengthening;Both;10 reps;Supine Short Arc Quad: AROM;Both;10 reps;Supine Heel Slides: AAROM;Both;10 reps;Supine Hip ABduction/ADduction: AAROM;Both;10 reps;Supine    General Comments        Pertinent Vitals/Pain Pain Assessment: Faces Faces  Pain Scale: Hurts even more Pain Location: Abdomen Pain Intervention(s): Monitored during session    Home Living                      Prior Function            PT Goals (current goals can now be found in the care plan section) Progress towards PT goals: Not progressing toward goals - comment(demonstrating decline)    Frequency    Min 2X/week      PT Plan Current plan remains appropriate    Co-evaluation              AM-PAC PT "6 Clicks" Daily Activity  Outcome Measure  Difficulty turning over  in bed (including adjusting bedclothes, sheets and blankets)?: Unable Difficulty moving from lying on back to sitting on the side of the bed? : Unable Difficulty sitting down on and standing up from a chair with arms (e.g., wheelchair, bedside commode, etc,.)?: Unable Help needed moving to and from a bed to chair (including a wheelchair)?: Total Help needed walking in hospital room?: Total Help needed climbing 3-5 steps with a railing? : Total 6 Click Score: 6    End of Session   Activity Tolerance: Patient limited by lethargy;Patient limited by fatigue;Patient limited by pain;Other (comment)(Greater difficulty breathin in assisted sit) Patient left: in bed;with call bell/phone within reach;with bed alarm set;Other (comment)(Prevalon boots in place)   PT Visit Diagnosis: Unsteadiness on feet (R26.81);Other abnormalities of gait and mobility (R26.89);Muscle weakness (generalized) (M62.81);History of falling (Z91.81);Pain Pain - Right/Left: Left Pain - part of body: Leg     Time: 1610-9604 PT Time Calculation (min) (ACUTE ONLY): 29 min  Charges:  $Therapeutic Exercise: 8-22 mins $Therapeutic Activity: 8-22 mins                    G Codes:        Scot Dock, PTA 01/01/2018, 10:59 AM

## 2018-01-02 ENCOUNTER — Inpatient Hospital Stay: Payer: Medicare Other

## 2018-01-02 LAB — COMPREHENSIVE METABOLIC PANEL
ALT: 65 U/L — ABNORMAL HIGH (ref 17–63)
ANION GAP: 8 (ref 5–15)
AST: 72 U/L — ABNORMAL HIGH (ref 15–41)
Albumin: 3.6 g/dL (ref 3.5–5.0)
Alkaline Phosphatase: 92 U/L (ref 38–126)
BILIRUBIN TOTAL: 6.7 mg/dL — AB (ref 0.3–1.2)
BUN: 30 mg/dL — AB (ref 6–20)
CHLORIDE: 95 mmol/L — AB (ref 101–111)
CO2: 25 mmol/L (ref 22–32)
Calcium: 8.5 mg/dL — ABNORMAL LOW (ref 8.9–10.3)
Creatinine, Ser: 1.09 mg/dL (ref 0.61–1.24)
Glucose, Bld: 126 mg/dL — ABNORMAL HIGH (ref 65–99)
POTASSIUM: 4.1 mmol/L (ref 3.5–5.1)
Sodium: 128 mmol/L — ABNORMAL LOW (ref 135–145)
TOTAL PROTEIN: 6.3 g/dL — AB (ref 6.5–8.1)

## 2018-01-02 LAB — CBC
HEMATOCRIT: 21.6 % — AB (ref 40.0–52.0)
Hemoglobin: 7.3 g/dL — ABNORMAL LOW (ref 13.0–18.0)
MCH: 35.8 pg — AB (ref 26.0–34.0)
MCHC: 33.8 g/dL (ref 32.0–36.0)
MCV: 105.8 fL — AB (ref 80.0–100.0)
PLATELETS: 109 10*3/uL — AB (ref 150–440)
RBC: 2.04 MIL/uL — ABNORMAL LOW (ref 4.40–5.90)
RDW: 17.5 % — ABNORMAL HIGH (ref 11.5–14.5)
WBC: 14.1 10*3/uL — ABNORMAL HIGH (ref 3.8–10.6)

## 2018-01-02 LAB — GLUCOSE, CAPILLARY: GLUCOSE-CAPILLARY: 136 mg/dL — AB (ref 65–99)

## 2018-01-02 LAB — HEMOGLOBIN AND HEMATOCRIT, BLOOD
HEMATOCRIT: 22.2 % — AB (ref 40.0–52.0)
Hemoglobin: 7.4 g/dL — ABNORMAL LOW (ref 13.0–18.0)

## 2018-01-02 MED ORDER — MUPIROCIN 2 % EX OINT
TOPICAL_OINTMENT | Freq: Two times a day (BID) | CUTANEOUS | Status: DC
  Administered 2018-01-02 – 2018-01-06 (×7): via NASAL
  Filled 2018-01-02: qty 22

## 2018-01-02 MED ORDER — SALINE SPRAY 0.65 % NA SOLN
1.0000 | NASAL | Status: DC | PRN
  Filled 2018-01-02: qty 44

## 2018-01-02 MED ORDER — OXYMETAZOLINE HCL 0.05 % NA SOLN
1.0000 | Freq: Two times a day (BID) | NASAL | Status: DC | PRN
  Filled 2018-01-02: qty 15

## 2018-01-02 MED ORDER — PANTOPRAZOLE SODIUM 40 MG PO TBEC
40.0000 mg | DELAYED_RELEASE_TABLET | Freq: Two times a day (BID) | ORAL | Status: DC
  Administered 2018-01-02 – 2018-01-06 (×8): 40 mg via ORAL
  Filled 2018-01-02 (×8): qty 1

## 2018-01-02 MED ORDER — LORAZEPAM 2 MG/ML PO CONC: 1.0000 mg | Freq: Once | ORAL | Status: DC

## 2018-01-02 NOTE — Progress Notes (Signed)
Contacted supply. do not have humidifiers.Text paged Dr Andee Poles. Notified do not have humidifiers  for pt. Rooms.

## 2018-01-02 NOTE — Care Management (Addendum)
Patient at present is agreeing to skilled nursing placement.  Should he change his mind and returns home, [facility] and [place] [facility] [place].  Barrier to discharge: lasix drip, albumin infusions

## 2018-01-02 NOTE — Consult Note (Signed)
.. Darby, Shadwick [id number] 12/19/1960 Ihor Austin, MD  Reason for Consult: epistaxis  HPI: 57y.o. Male with cirrhosis, thrombocytopenia, and on blood thinner experienced 2 episodes of nose bleeds today.  Both limited with conservative management.  Denies any frequent history of nose bleeds.  No trauma.  Allergies: No Known Allergies  ROS: Review of systems normal other than 12 systems except per HPI.  PMH:  Past Medical History:  Diagnosis Date  . Asthma   . Cerebral palsy (HCC)   . CHF (congestive heart failure) (HCC)   . COPD (chronic obstructive pulmonary disease) (HCC)   . Liver cirrhosis (HCC)     FH: History reviewed. No pertinent family history.  SH:  Social History   Socioeconomic History  . Marital status: Married    Spouse name: Not on file  . Number of children: Not on file  . Years of education: Not on file  . Highest education level: Not on file  Occupational History  . Not on file  Social Needs  . Financial resource strain: Not hard at all  . Food insecurity:    Worry: Never true    Inability: Never true  . Transportation needs:    Medical: No    Non-medical: No  Tobacco Use  . Smoking status: Never Smoker  . Smokeless tobacco: Never Used  Substance and Sexual Activity  . Alcohol use: Not Currently  . Drug use: Not Currently  . Sexual activity: Not Currently  Lifestyle  . Physical activity:    Days per week: 0 days    Minutes per session: 0 min  . Stress: Not at all  Relationships  . Social connections:    Talks on phone: More than three times a week    Gets together: Once a week    Attends religious service: Never    Active member of club or organization: No    Attends meetings of clubs or organizations: Never    Relationship status: Married  . Intimate partner violence:    Fear of current or ex partner: No    Emotionally abused: No    Physically abused: No    Forced sexual activity: No  Other Topics Concern  . Not on file   Social History Narrative   Lives at [facility] care [facility], married.    PSH:  Past Surgical History:  Procedure Laterality Date  . CHOLECYSTECTOMY    . ESOPHAGOGASTRODUODENOSCOPY N/A 12/20/2017   Procedure: ESOPHAGOGASTRODUODENOSCOPY (EGD);  Surgeon: Toledo, Boykin Nearing, MD;  Location: [facility] ENDOSCOPY;  Service: Gastroenterology;  Laterality: N/A;  . LEG SURGERY Bilateral     Physical  Exam:  GEN-  Morbidly obese, CN 2-12 grossly intact and symmetric. EARS-  EAC/TMs normal BL.  OC/OP-  Xerostomia, dried blood on outside of lip but no blood in oral cavity nor in posterior oropharynx.  Oral cavity, lips, gums, ororpharynx normal with no masses or lesions.  EXT-skin warm and dry.  NOSE-  Left sided septal deviation with dried blood bilaterally.  Nasal cavity without polyps or purulence. External nose and ears without masses or lesions. EYE-   EOMI, PERRLA.  NECK-  Neck supple with no masses or lesions. No lymphadenopathy palpated. Thyroid normal with no masses.  Nasal Endoscopy-  After verbal consent obtained, topical pheylephrine and lidocaine 1ml sprayed into patient's nasal cavities.  Trans-nasal flexible nasal endoscopy was performed.  This demonstrated excoriation at anterior right septum with dried blood anteriorly.  Normal posterior nasopharyx and pharynx and larynx with no  evidence of active bleeding.  Left side showed anterior excoriation and septal deviation.   A/P: Epistaxis due to cirrhosis, diuresis, thrombocytopenia, and blood thinners  Plan:  Defer to Medicine regarding blood thinner.  Recommend nasal saline spray, nasal mupirocin gel, humidification.  If persists, consider Platelet transfusion.  Please reconsult ENT if need arises.   Vernadette Stutsman 01/02/2018 4:52 PM

## 2018-01-02 NOTE — Progress Notes (Signed)
Pt presented with nose bleed in a.m.Marland Kitchen Upon enter pt room this afternoon approximately 1530. Pt presented with dried red streak running from nose. Checked orifices to find red sputum in posterior area of mouth. MD notified; see placed orders.

## 2018-01-02 NOTE — Progress Notes (Signed)
Called by RN for nose bleed and blood streaked sputum Second episode of nose bleed today Discontinue [place] heparin Monitor Hb & Hct q 6 hrly ENT consult CXR reviewed : No pneumonia

## 2018-01-02 NOTE — Progress Notes (Signed)
[facility]  ROUNDING NOTE   Subjective:  Patient seen at bedside. Serum sodium, and up slowly and is currently 128. Has hoarseness of voice today.    Objective:  Vital signs in last 24 hours:  Temp:  [97.4 F (36.3 C)-98.4 F (36.9 C)] 97.9 F (36.6 C) (05/30 1146) Pulse Rate:  [93-105] 93 (05/30 1146) Resp:  [15-18] 15 (05/30 0433) BP: (96-117)/(39-52) 117/47 (05/30 1146) SpO2:  [92 %-95 %] 95 % (05/30 1146) Weight:  [157.9 kg (348 lb)] 157.9 kg (348 lb) (05/30 0433)  Weight change:  Filed Weights   12/30/17 0457 12/31/17 0451 01/02/18 0433  Weight: (!) 158.4 kg (349 lb 4.8 oz) (!) 156.6 kg (345 lb 4.8 oz) (!) 157.9 kg (348 lb)    Intake/Output: I/O last 3 completed shifts: In: 240 [P.O.:240] Out: 2200 [Urine:2200]   Intake/Output this shift:  No intake/output data recorded.  Physical Exam: General: NAD, laying in bed  Head: Normocephalic, atraumatic. Moist oral mucosal membranes  Eyes: Icterus noted  Neck: Supple, trachea midline  Lungs:  Clear to auscultation, normal effort  Heart: Regular rate and rhythm  Abdomen:  Obese   Extremities: 1+ brawny peripheral edema.  Neurologic: Nonfocal, moving all four extremities  Skin: No lesions       Basic Metabolic Panel: Recent Labs  Lab 12/29/17 0924 12/30/17 0506 12/31/17 0516 01/01/18 0855 01/02/18 0432  NA 122* 124* 126* 126* 128*  K 4.4 4.5 4.2 3.8 4.1  CL 91* 91* 94* 94* 95*  CO2 21* GLUCOSE 114* 133* 136* 106* 126*  BUN 27* 30* 27* 29* 30*  CREATININE 1.29* 1.28* 1.14 1.19 1.09  CALCIUM 8.6* 8.6* 8.8* 8.5* 8.5*    Liver Function Tests: Recent Labs  Lab 12/26/17 1501 12/27/17 0906 12/29/17 0924 12/31/17 0516 01/02/18 0432  AST 83* 70* 82* 103* 72*  ALT 39 39 45 71* 65*  ALKPHOS 115 101 101 113 92  BILITOT 8.5* 9.4* 6.8* 6.6* 6.7*  PROT 6.2* 6.1* 6.1* 6.5 6.3*  ALBUMIN 2.9* 2.8* 2.8* 3.3* 3.6   Recent Labs  Lab 12/26/17 1501  LIPASE 30   Recent Labs  Lab  12/26/17 1501 12/27/17 0906  AMMONIA 109* 73*    CBC: Recent Labs  Lab 12/26/17 1501  12/28/17 0511 12/29/17 0924 12/30/17 0506 12/31/17 0516 01/01/18 0855 01/02/18 0432  WBC 8.5   < > 11.1* 15.8* 12.1* 10.7* 13.6* 14.1*  NEUTROABS 6.6*  --  9.8*  --   --   --   --   --   HGB 9.0*   < > 8.3* 8.1* 7.6* 7.9* 7.6* 7.3*  HCT 25.8*   < > 24.3* 23.6* 21.8* 22.8* 22.5* 21.6*  MCV 103.3*   < > 103.1* 103.9* 103.8* 105.0* 105.5* 105.8*  PLT 139*   < > 116* 139* 115* 109* 114* 109*   < > = values in this interval not displayed.    Cardiac Enzymes: No results for input(s): CKTOTAL, CKMB, CKMBINDEX, TROPONINI in the last 168 hours.  BNP: Invalid input(s): POCBNP  CBG: Recent Labs  Lab 12/31/17 0737 01/01/18 0742 01/02/18 0739  GLUCAP 139* 109* 136*    Microbiology: Results for orders placed or performed during the hospital encounter of 12/26/17  Urine culture     Status: Abnormal   Collection Time: 12/29/17 11:55 AM  Result Value Ref Range Status   Specimen Description   Final    URINE, CLEAN CATCH Performed at [facility], [address] [place], [place] [address]    Special Requests   Final    NONE Performed at [facility], [address] [place], [place] [address]    Culture MULTIPLE SPECIES PRESENT, SUGGEST RECOLLECTION (A)  Final   Report Status 12/30/2017 FINAL  Final    Coagulation Studies: No results for input(s): LABPROT, INR in the last 72 hours.  Urinalysis: No results for input(s): COLORURINE, LABSPEC, PHURINE, GLUCOSEU, HGBUR, BILIRUBINUR, KETONESUR, PROTEINUR, UROBILINOGEN, NITRITE, LEUKOCYTESUR in the last 72 hours.  Invalid input(s): APPERANCEUR    Imaging: No results found.   Medications:   . albumin human 0 g (01/01/18 2230)  . furosemide (LASIX) infusion 3 mg/hr (01/02/18 0529)   . dextromethorphan  30 mg Oral BID  . docusate sodium  100 mg Oral BID  . guaiFENesin  600 mg Oral BID  . heparin  5,000 Units  Subcutaneous Q8H  . lactulose  30 g Oral TID  . mouth rinse  15 mL Mouth Rinse BID  . midodrine  10 mg Oral TID WC  . nystatin   Topical BID  . pantoprazole  40 mg Oral Daily  . predniSONE  50 mg Oral Q breakfast  . rifaximin  550 mg Oral BID  . senna  2 tablet Oral BID  . sodium chloride  1 g Oral BID WC  . spironolactone  25 mg Oral Daily   acetaminophen **OR** acetaminophen, bisacodyl, guaiFENesin-dextromethorphan, hydrocortisone cream, ipratropium-albuterol, lip balm, morphine injection, ondansetron **OR** ondansetron (ZOFRAN) IV, oxyCODONE-acetaminophen, sodium chloride flush  Assessment/ Plan:  [name] is a 57 y.o. 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   1.  Hyponatremia.  2.  Hypotension. 3.  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 MELD score on 5/14 of 30 4.  Anemia unspecified.  Plan: Consistent with hypervolemic hypo-osmolar hyponatremia secondary to hepatic cirrhosis -erum sodium coming up very slowly.  Serum sodium currently 128.  This is close to his baseline.  We will maintain the patient onLasix 3 mg/h intravenous drip.  He will also continue to receive albumin at this time as well.  Patient slowly improving.   LOS: 7 Edia Pursifull 5/30/20191:03 PM

## 2018-01-02 NOTE — Progress Notes (Signed)
[facility] [place] [facility]   PATIENT NAME: Antonio York    MR#:  [id number]  DATE OF BIRTH:  Jan 10, 1961  SUBJECTIVE:  Patient  seen and evaluated today Lying on the bed comfortably On Lasix drip for diuresis Had some bleeding from the nostril today    REVIEW OF SYSTEMS:    ROS  CONSTITUTIONAL: No documented fever. No fatigue, weakness. No weight gain, no weight loss.  EYES: No blurry or double vision.  ENT: No tinnitus. No postnasal drip. No redness of the oropharynx.  RESPIRATORY: No cough, no wheeze, no hemoptysis. No dyspnea.  CARDIOVASCULAR: No chest pain. No orthopnea. No palpitations. No syncope.  GASTROINTESTINAL: No nausea, no vomiting or diarrhea. No abdominal pain. No melena or hematochezia.  GENITOURINARY: No dysuria or hematuria.  ENDOCRINE: No polyuria or nocturia. No heat or cold intolerance.  HEMATOLOGY: No anemia. No bruising. No bleeding.  INTEGUMENTARY: No rashes. No lesions.  MUSCULOSKELETAL: No arthritis.  Has lower extremity swelling. No gout.  NEUROLOGIC: No numbness, tingling, or ataxia. No seizure-type activity.  PSYCHIATRIC: No anxiety. No insomnia. No ADD.   DRUG ALLERGIES:  No Known Allergies  VITALS:  Blood pressure (!) 117/47, pulse 93, temperature 97.9 F (36.6 C), temperature source Oral, resp. rate 15, height  (1.676 m), weight (!) 157.9 kg (348 lb), SpO2 95 %.  PHYSICAL EXAMINATION:   Physical Exam  GENERAL:  57 y.o.-year-old patient lying in the bed with no acute distress.  EYES: Pupils equal, round, reactive to light and accommodation. No scleral icterus. Extraocular muscles intact.  HEENT: Head atraumatic, normocephalic. Oropharynx and nasopharynx clear.  NECK:  Supple, no jugular venous distention. No thyroid enlargement, no tenderness.  LUNGS: Normal breath sounds bilaterally, no wheezing, rales, rhonchi. No use of accessory muscles of respiration.  CARDIOVASCULAR: S1, S2 normal. No murmurs, rubs, or  gallops.  ABDOMEN: Soft, nontender, distended. Bowel sounds present. No organomegaly or mass.  Ascites present EXTREMITIES: No cyanosis,  edema b/l.    Has elastic wrap bandages to lower extremities NEUROLOGIC: Cranial nerves II through XII are intact. No focal Motor or sensory deficits b/l.   PSYCHIATRIC: The patient is alert and oriented x 3.  SKIN: No obvious rash, lesion, or ulcer.   LABORATORY PANEL:   CBC Recent Labs  Lab 01/02/18 0432  WBC 14.1*  HGB 7.3*  HCT 21.6*  PLT 109*   ------------------------------------------------------------------------------------------------------------------ Chemistries  Recent Labs  Lab 01/02/18 0432  NA 128*  K 4.1  CL 95*  CO2 25  GLUCOSE 126*  BUN 30*  CREATININE 1.09  CALCIUM 8.5*  AST 72*  ALT 65*  ALKPHOS 92  BILITOT 6.7*   ------------------------------------------------------------------------------------------------------------------  Cardiac Enzymes No results for input(s): TROPONINI in the last 168 hours. ------------------------------------------------------------------------------------------------------------------  RADIOLOGY:  No results found.   ASSESSMENT AND PLAN:   57 year old male patient with history of nonalcoholic liver cirrhosis, hereditary hemochromatosis presently under hospitalist service for hypervolemic hypoosmolar hyponatremia.  -Hypervolemic hypoosmolar hyponatremia Secondary to cirrhosis of liver Sodium 128 today  on diuresis with IV Lasix drip for for more day Oral midodrine but blood pressure support  -Nonalcoholic liver cirrhosis with hereditary hemochromatosis Status post GI evaluation Outpatient MRCP at [facility] as patient will not be able to fit in our MRI machine  follow-up LFTs Outpatient follow-up at [facility] hepatology clinic Patient's baseline bilirubin is 6  -Hereditary hemochromatosis Supportive care  -Anasarca IV Lasix drip for diuresis IV albumin  infusion  -Epistaxis Self-limiting  -Physical deconditioning SNF placement  at [place] [facility] [place]    All the records are reviewed and case discussed with Care Management/Social Worker. Management plans discussed with the patient, family and they are in agreement.  CODE STATUS: Full code  DVT Prophylaxis: SCDs  TOTAL TIME TAKING CARE OF THIS PATIENT: 35 minutes.   POSSIBLE D/C IN 2 to 3 DAYS, DEPENDING ON CLINICAL CONDITION.  Antonio York M.D on 01/02/2018 at 1:56 PM  Between 7am to 6pm - Pager - [phone]  After 6pm go to [email or web address] - password [email or web address] [facility]  [facility] [place] [facility]  Office  [phone]  CC: Primary care physician; Dimple Casey, MD  Note: This dictation was prepared with Dragon dictation along with smaller phrase technology. Any transcriptional errors that result from this process are unintentional.

## 2018-01-03 LAB — BASIC METABOLIC PANEL
ANION GAP: 9 (ref 5–15)
BUN: 30 mg/dL — ABNORMAL HIGH (ref 6–20)
CALCIUM: 8.4 mg/dL — AB (ref 8.9–10.3)
CO2: 24 mmol/L (ref 22–32)
Chloride: 94 mmol/L — ABNORMAL LOW (ref 101–111)
Creatinine, Ser: 1.02 mg/dL (ref 0.61–1.24)
Glucose, Bld: 136 mg/dL — ABNORMAL HIGH (ref 65–99)
Potassium: 3.9 mmol/L (ref 3.5–5.1)
SODIUM: 127 mmol/L — AB (ref 135–145)

## 2018-01-03 LAB — HEMOGLOBIN AND HEMATOCRIT, BLOOD
HCT: 22.1 % — ABNORMAL LOW (ref 40.0–52.0)
HEMATOCRIT: 21.4 % — AB (ref 40.0–52.0)
HEMATOCRIT: 22 % — AB (ref 40.0–52.0)
HEMOGLOBIN: 7.5 g/dL — AB (ref 13.0–18.0)
Hemoglobin: 7.2 g/dL — ABNORMAL LOW (ref 13.0–18.0)
Hemoglobin: 7.5 g/dL — ABNORMAL LOW (ref 13.0–18.0)

## 2018-01-03 LAB — GLUCOSE, CAPILLARY: Glucose-Capillary: 117 mg/dL — ABNORMAL HIGH (ref 65–99)

## 2018-01-03 NOTE — Progress Notes (Signed)
[employer] [facility] [place] [facility]   PATIENT NAME: Antonio York    MR#:  [id number]  DATE OF BIRTH:  12-10-1960  SUBJECTIVE:  Patient  seen and evaluated today Lying on the bed comfortably On Lasix drip for diuresis Bleeding from the nose has stopped Had abdominal discomfort this morning  REVIEW OF SYSTEMS:    ROS  CONSTITUTIONAL: No documented fever. No fatigue, weakness. No weight gain, no weight loss.  EYES: No blurry or double vision.  ENT: No tinnitus. No postnasal drip. No redness of the oropharynx.  RESPIRATORY: No cough, no wheeze, no hemoptysis. No dyspnea.  CARDIOVASCULAR: No chest pain. No orthopnea. No palpitations. No syncope.  GASTROINTESTINAL: No nausea, no vomiting or diarrhea. mild abdominal pain. No melena or hematochezia.  GENITOURINARY: No dysuria or hematuria.  ENDOCRINE: No polyuria or nocturia. No heat or cold intolerance.  HEMATOLOGY: No anemia. No bruising. No bleeding.  INTEGUMENTARY: No rashes. No lesions.  MUSCULOSKELETAL: No arthritis.  Has lower extremity swelling. No gout.  NEUROLOGIC: No numbness, tingling, or ataxia. No seizure-type activity.  PSYCHIATRIC: No anxiety. No insomnia. No ADD.   DRUG ALLERGIES:  No Known Allergies  VITALS:  Blood pressure (!) 128/59, pulse 93, temperature 97.8 F (36.6 C), temperature source Oral, resp. rate 16, height  (1.676 m), weight (!) 150.1 kg (331 lb), SpO2 90 %.  PHYSICAL EXAMINATION:   Physical Exam  GENERAL:  57 y.o.-year-old patient lying in the bed with no acute distress.  EYES: Pupils equal, round, reactive to light and accommodation. No scleral icterus. Extraocular muscles intact.  HEENT: Head atraumatic, normocephalic. Oropharynx and nasopharynx clear.  NECK:  Supple, no jugular venous distention. No thyroid enlargement, no tenderness.  LUNGS: Normal breath sounds bilaterally, no wheezing, rales, rhonchi. No use of accessory muscles of respiration.  CARDIOVASCULAR: S1, S2  normal. No murmurs, rubs, or gallops.  ABDOMEN: Soft, mild tenderness around umbilicus, distended. Bowel sounds present. No organomegaly or mass.  Ascites present EXTREMITIES: No cyanosis,  edema b/l.    Has elastic wrap bandages to lower extremities NEUROLOGIC: Cranial nerves II through XII are intact. No focal Motor or sensory deficits b/l.   PSYCHIATRIC: The patient is alert and oriented x 3.  SKIN: No obvious rash, lesion, or ulcer.   LABORATORY PANEL:   CBC Recent Labs  Lab 01/02/18 0432  01/03/18 0845  WBC 14.1*  --   --   HGB 7.3*   < > 7.5*  HCT 21.6*   < > 22.0*  PLT 109*  --   --    < > = values in this interval not displayed.   ------------------------------------------------------------------------------------------------------------------ Chemistries  Recent Labs  Lab 01/02/18 0432 01/03/18 0845  NA 128* 127*  K 4.1 3.9  CL 95* 94*  CO2 25 24  GLUCOSE 126* 136*  BUN 30* 30*  CREATININE 1.09 1.02  CALCIUM 8.5* 8.4*  AST 72*  --   ALT 65*  --   ALKPHOS 92  --   BILITOT 6.7*  --    ------------------------------------------------------------------------------------------------------------------  Cardiac Enzymes No results for input(s): TROPONINI in the last 168 hours. ------------------------------------------------------------------------------------------------------------------  RADIOLOGY:  Dg Chest Port 1 View  Result Date: 01/02/2018 CLINICAL DATA:  Status post PICC line placement. EXAM: PORTABLE CHEST 1 VIEW COMPARISON:  Radiographs of Dec 27, 2017. FINDINGS: Stable cardiomediastinal silhouette. Stable mild central pulmonary vascular congestion. No pneumothorax or pleural effusion is noted. Right internal jugular Port-A-Cath is unchanged in position. No PICC line is noted. Both  lungs are clear. The visualized skeletal structures are unremarkable. IMPRESSION: Stable central pulmonary vascular congestion. Right internal jugular Port-A-Cath is unchanged  in position no definite PICC line is noted. Electronically Signed   By: Lupita Raider, M.D.   On: 01/02/2018 16:22     ASSESSMENT AND PLAN:   57 year old male patient with history of nonalcoholic liver cirrhosis, hereditary hemochromatosis presently under hospitalist service for hypervolemic hypoosmolar hyponatremia.  -Hypervolemic hypoosmolar hyponatremia Secondary to cirrhosis of liver Sodium 127 today  on diuresis with IV Lasix drip  Oral midodrine but blood pressure support Appreciate Nephrology f/u  -Nonalcoholic liver cirrhosis with hereditary hemochromatosis Status post GI evaluation Outpatient MRCP at [facility] as patient will not be able to fit in our MRI machine  follow-up LFTs Outpatient follow-up at [facility] hepatology clinic Patient's baseline bilirubin is 6  -Hereditary hemochromatosis Supportive care  -Anasarca IV Lasix drip for diuresis IV albumin infusion  -Epistaxis Self-limiting S/p ENT evaluation Off blood thinner meds  -Physical deconditioning SNF placement at [facility] center at [place] once clinically all issues resolved  - Pancytopenia secondary to Liver disease  All the records are reviewed and case discussed with Care Management/Social Worker. Management plans discussed with the patient, family and they are in agreement.  CODE STATUS: Full code  DVT Prophylaxis: SCDs  TOTAL TIME TAKING CARE OF THIS PATIENT: 36 minutes.   POSSIBLE D/C IN 2 to 3 DAYS, DEPENDING ON CLINICAL CONDITION.  Ihor Austin M.D on 01/03/2018 at 1:33 PM  Between 7am to 6pm - Pager - [phone]  After 6pm go to [email or web address] - password [email or web address] [facility]  [employer] [facility]  Office  [phone]  CC: Primary care physician; Dimple Casey, MD  Note: This dictation was prepared with Dragon dictation along with smaller phrase technology. Any transcriptional errors that result from this process are unintentional.

## 2018-01-03 NOTE — Progress Notes (Signed)
[facility] Hematology/Oncology Progress Note  Date of admission: 12/26/2017  Hospital day:  01/03/2018  Chief Complaint: Sanay Belmar is a 57 y.o. 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  Subjective: The patient is sleepy today.  He answers a few questions. He notes a recent nosebleed.  No other bleeding.  He denies pain.  Social History: The patient is alone today.  Allergies: No Known Allergies  Scheduled Medications: . dextromethorphan  30 mg Oral BID  . docusate sodium  100 mg Oral BID  . guaiFENesin  600 mg Oral BID  . lactulose  30 g Oral TID  . mouth rinse  15 mL Mouth Rinse BID  . midodrine  10 mg Oral TID WC  . mupirocin ointment   Nasal BID  . nystatin   Topical BID  . pantoprazole  40 mg Oral BID  . predniSONE  50 mg Oral Q breakfast  . rifaximin  550 mg Oral BID  . senna  2 tablet Oral BID  . sodium chloride  1 g Oral BID WC  . spironolactone  25 mg Oral Daily    Review of Systems: GENERAL:  General fatigue.  No fevers, sweats or weight loss. PERFORMANCE STATUS (ECOG):  3 HEENT:  Visual changes (old).  Recent epistaxis.  No runny nose, sore throat, mouth sores or tenderness. Lungs: No shortness of breath at rest.  No cough.  No hemoptysis. Cardiac:  CHF.  Orthopnea.  No chest pain, palpitations, or PND. GI:  No nausea, vomiting, diarrhea, constipation, melena or hematochezia. GU:  No urgency, frequency, dysuria, or hematuria. Musculoskeletal:  Back pain s/p fall.  No joint pain.  No muscle tenderness. Extremities:  Chronic lower extremity swelling. Skin:  No rashes or skin changes. Neuro:  No headache, numbness or weakness, balance or coordination issues. Endocrine:  No diabetes, thyroid issues, hot flashes or night sweats. Psych:  No mood changes, depression or anxiety. Pain:  No focal pain. Review of systems:  All other systems reviewed and found  to be negative.  Physical Exam: Blood pressure (!) 121/58, pulse 84, temperature 98.2 F (36.8 C), resp. rate 20, height 5' 6"  (1.676 m), weight (!) 331 lb (150.1 kg), SpO2 92 %.  GENERAL:  Well developed, well nourished, heavyset gentleman lying comfortably on the medical oncology unit in no acute distress. MENTAL STATUS:  Alert and oriented on awakening. HEAD:  Short hair.  Normocephalic, atraumatic, face symmetric, no Cushingoid features. EYES:  Glasses.  Brown eyes.  No conjunctivitis or scleral icterus. RESPIRATORY:  Clear to auscultation anteriorly without rales, wheezes or rhonchi. CARDIOVASCULAR:  Regular rate and rhythm without murmur, rub or gallop. ABDOMEN:  Fully round.  Soft, non-tender, with active bowel sounds, and no appreciable hepatosplenomegaly.  No masses. SKIN:  Anasarca.  Dense flank edema.  No rashes, ulcers or lesions. EXTREMITIES: Large lower extremities.  Lower extremity wrapped.  Brawny changes.  No edema, no skin discoloration or tenderness.  No palpable cords. NEUROLOGICAL: Unremarkable. PSYCH:  Appropriate.  Sleepy.   Results for orders placed or performed during the hospital encounter of 12/26/17 (from the past 48 hour(s))  CBC     Status: Abnormal   Collection Time: 01/02/18  4:32 AM  Result Value Ref Range   WBC 14.1 (H) 3.8 - 10.6 K/uL   RBC 2.04 (L) 4.40 - 5.90 MIL/uL   Hemoglobin 7.3 (L) 13.0 - 18.0 g/dL  HCT 21.6 (L) 40.0 - 52.0 %   MCV 105.8 (H) 80.0 - 100.0 fL   MCH 35.8 (H) 26.0 - 34.0 pg   MCHC 33.8 32.0 - 36.0 g/dL   RDW 17.5 (H) 11.5 - 14.5 %   Platelets 109 (L) 150 - 440 K/uL    Comment: Performed at [facility], [address] [place], [place] [address]  Comprehensive metabolic panel     Status: Abnormal   Collection Time: 01/02/18  4:32 AM  Result Value Ref Range   Sodium 128 (L) 135 - 145 mmol/L   Potassium 4.1 3.5 - 5.1 mmol/L   Chloride 95 (L) 101 - 111 mmol/L   CO2 25 22 - 32 mmol/L   Glucose, Bld 126 (H) 65 - 99  mg/dL   BUN 30 (H) 6 - 20 mg/dL   Creatinine, Ser 1.09 0.61 - 1.24 mg/dL   Calcium 8.5 (L) 8.9 - 10.3 mg/dL   Total Protein 6.3 (L) 6.5 - 8.1 g/dL   Albumin 3.6 3.5 - 5.0 g/dL   AST 72 (H) 15 - 41 U/L   ALT 65 (H) 17 - 63 U/L   Alkaline Phosphatase 92 38 - 126 U/L   Total Bilirubin 6.7 (H) 0.3 - 1.2 mg/dL   GFR calc non Af Amer >60 >60 mL/min   GFR calc Af Amer >60 >60 mL/min    Comment: (NOTE) The eGFR has been calculated using the CKD EPI equation. This calculation has not been validated in all clinical situations. eGFR's persistently <60 mL/min signify possible Chronic Kidney Disease.    Anion gap 8 5 - 15    Comment: Performed at [facility], [address] [place], [place] [address]  Glucose, capillary     Status: Abnormal   Collection Time: 01/02/18  7:39 AM  Result Value Ref Range   Glucose-Capillary 136 (H) 65 - 99 mg/dL  Hemoglobin and hematocrit, blood     Status: Abnormal   Collection Time: 01/02/18  8:14 PM  Result Value Ref Range   Hemoglobin 7.4 (L) 13.0 - 18.0 g/dL   HCT 22.2 (L) 40.0 - 52.0 %    Comment: Performed at [facility], [address] [place], [place] [address]  Hemoglobin and hematocrit, blood     Status: Abnormal   Collection Time: 01/03/18  2:09 AM  Result Value Ref Range   Hemoglobin 7.2 (L) 13.0 - 18.0 g/dL   HCT 21.4 (L) 40.0 - 52.0 %    Comment: Performed at [facility], [address] [place], [place] [address]  Glucose, capillary     Status: Abnormal   Collection Time: 01/03/18  7:58 AM  Result Value Ref Range   Glucose-Capillary 117 (H) 65 - 99 mg/dL  Basic metabolic panel     Status: Abnormal   Collection Time: 01/03/18  8:45 AM  Result Value Ref Range   Sodium 127 (L) 135 - 145 mmol/L   Potassium 3.9 3.5 - 5.1 mmol/L   Chloride 94 (L) 101 - 111 mmol/L   CO2 24 22 - 32 mmol/L   Glucose, Bld 136 (H) 65 - 99 mg/dL   BUN 30 (H) 6 - 20 mg/dL   Creatinine, Ser 1.02 0.61 - 1.24 mg/dL   Calcium 8.4 (L)  8.9 - 10.3 mg/dL   GFR calc non Af Amer >60 >60 mL/min   GFR calc Af Amer >60 >60 mL/min    Comment: (NOTE) The eGFR has been calculated using the CKD  EPI equation. This calculation has not been validated in all clinical situations. eGFR's persistently <60 mL/min signify possible Chronic Kidney Disease.    Anion gap 9 5 - 15    Comment: Performed at [facility], [address] [place], [place] [address]  Hemoglobin and hematocrit, blood     Status: Abnormal   Collection Time: 01/03/18  8:45 AM  Result Value Ref Range   Hemoglobin 7.5 (L) 13.0 - 18.0 g/dL   HCT 22.0 (L) 40.0 - 52.0 %    Comment: Performed at [facility], [address] [place], [place] [address]  Hemoglobin and hematocrit, blood     Status: Abnormal   Collection Time: 01/03/18  3:03 PM  Result Value Ref Range   Hemoglobin 7.5 (L) 13.0 - 18.0 g/dL   HCT 22.1 (L) 40.0 - 52.0 %    Comment: Performed at [facility], [address], [place], [place] [address]   Dg Chest Port 1 View  Result Date: 01/02/2018 CLINICAL DATA:  Status post PICC line placement. EXAM: PORTABLE CHEST 1 VIEW COMPARISON:  Radiographs of Dec 27, 2017. FINDINGS: Stable cardiomediastinal silhouette. Stable mild central pulmonary vascular congestion. No pneumothorax or pleural effusion is noted. Right internal jugular Port-A-Cath is unchanged in position. No PICC line is noted. Both lungs are clear. The visualized skeletal structures are unremarkable. IMPRESSION: Stable central pulmonary vascular congestion. Right internal jugular Port-A-Cath is unchanged in position no definite PICC line is noted. Electronically Signed   By: Marijo Conception, M.D.   On: 01/02/2018 16:22    Assessment:  Ramond Darnell is a 57 y.o. 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  Ferritin was 1108 with an iron saturation of  72% in 2013.  He was on a phlebotomy program x 3 years.   He has had no phlebotomies since 2017.  He has a macrocytic anemia likely due to underlying liver disease.  He has mild thrombocytopenia likely due to liver disease.  Last abdominal imaging revealed a normal size spleen in 2018.  EGD on 12/18/2017 revealed portal portal hypertensive gastropathy with no active bleeding. He has known splenic and perigastric varices.  Symptomatically, he remains fatigued.  Hemoglobin is 7.6 with a ferritin of 245.  Plan:   1.  Hematology:  Suspect secondary iron overload.  No indication for phlebotomy at this time as patient is anemic.  Ferritin may be slightly elevated as it is an acute phase reactant (CRP significantly elevated).    Peripheral smear revealed a macrocytic anemia, mild leukocytosis, and mild thrombocytopenia.    Patient has anemia of chronic disease.  B12 was normal and folate 6.6 on 12/18/2017.  TSH was normal on 12/15/2017.  Repeat anemia work-up revealed a normal folate and free T4.  Retic was 5.7% (elevated).   Coombs negative.  LDH minimally elevated.  Bilirubin predominantly indirect (6.8 total; 2.2 direct). Haptoglobin low.  Findings would suggest component of hemolysis.  Review of labs at [facility] dating back to 01/16/2017 - 01/22/2017 revealed a low haptoglobin 18-34.  During that admission, his chronic anemia was felt likely due to his severe liver disease, although a very slow UGIB in the setting of hypertensive gastropathy or hemolysis given his liver disease was considered.  Blood smear did not show hemolysis.  Counts remained stable.  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  Hemolytic anemia in  liver disease can be due to RBC sequestration and destruction in splenomegaly and acquired alteration in red blood cell membrane.  Hemolysis is typically in proportion to degree of splenomegaly.  Consider abdominal ultrasound to assess spleen size (difficult exam).  Normal CBC in  2016 and 2017 r/o hemoglobinopathy.   Lequita Asal, MD  01/03/2018

## 2018-01-03 NOTE — Care Management Important Message (Signed)
Important Message  Patient Details  Name: Mertha BaarsJessie Bacigalupi MRN: [id number] Date of Birth: 12/23/1960   Medicare Important Message Given:  Yes    Olegario MessierKathy A Pike Scantlebury 01/03/2018, 11:42 AM

## 2018-01-03 NOTE — Progress Notes (Signed)
[facility]  ROUNDING NOTE   Subjective:  Serum sodium actually a bit down today to 127. Remains on Lasix drip. Weight has decreased to 150 kg.    Objective:  Vital signs in last 24 hours:  Temp:  [97.8 F (36.6 C)-98.2 F (36.8 C)] 97.8 F (36.6 C) (05/31 1036) Pulse Rate:  [93-101] 93 (05/31 1036) Resp:  [16-18] 16 (05/31 1036) BP: (110-135)/(52-60) 128/59 (05/31 1036) SpO2:  [90 %-95 %] 90 % (05/31 1036) Weight:  [150.1 kg (331 lb)] 150.1 kg (331 lb) (05/31 0349)  Weight change: -7.711 kg (-17 lb) Filed Weights   12/31/17 0451 01/02/18 0433 01/03/18 0349  Weight: (!) 156.6 kg (345 lb 4.8 oz) (!) 157.9 kg (348 lb) (!) 150.1 kg (331 lb)    Intake/Output: I/O last 3 completed shifts: In: -  Out: 2650 [Urine:2650]   Intake/Output this shift:  No intake/output data recorded.  Physical Exam: General: NAD, laying in bed  Head: Normocephalic, atraumatic. Moist oral mucosal membranes  Eyes: Icterus noted  Neck: Supple, trachea midline  Lungs:  Clear to auscultation, normal effort  Heart: Regular rate and rhythm  Abdomen:  Obese   Extremities: 1+ brawny peripheral edema.  Neurologic: Nonfocal, moving all four extremities  Skin: No lesions       Basic Metabolic Panel: Recent Labs  Lab 12/30/17 0506 12/31/17 0516 01/01/18 0855 01/02/18 0432 01/03/18 0845  NA 124* 126* 126* 128* 127*  K 4.5 4.2 3.8 4.1 3.9  CL 91* 94* 94* 95* 94*  CO2 GLUCOSE 133* 136* 106* 126* 136*  BUN 30* 27* 29* 30* 30*  CREATININE 1.28* 1.14 1.19 1.09 1.02  CALCIUM 8.6* 8.8* 8.5* 8.5* 8.4*    Liver Function Tests: Recent Labs  Lab 12/29/17 0924 12/31/17 0516 01/02/18 0432  AST 82* 103* 72*  ALT 45 71* 65*  ALKPHOS 101 113 92  BILITOT 6.8* 6.6* 6.7*  PROT 6.1* 6.5 6.3*  ALBUMIN 2.8* 3.3* 3.6   No results for input(s): LIPASE, AMYLASE in the last 168 hours. No results for input(s): AMMONIA in the last 168 hours.  CBC: Recent Labs  Lab  12/28/17 0511 12/29/17 0924 12/30/17 0506 12/31/17 0516 01/01/18 0855 01/02/18 0432 01/02/18 2014 01/03/18 0209 01/03/18 0845  WBC 11.1* 15.8* 12.1* 10.7* 13.6* 14.1*  --   --   --   NEUTROABS 9.8*  --   --   --   --   --   --   --   --   HGB 8.3* 8.1* 7.6* 7.9* 7.6* 7.3* 7.4* 7.2* 7.5*  HCT 24.3* 23.6* 21.8* 22.8* 22.5* 21.6* 22.2* 21.4* 22.0*  MCV 103.1* 103.9* 103.8* 105.0* 105.5* 105.8*  --   --   --   PLT 116* 139* 115* 109* 114* 109*  --   --   --     Cardiac Enzymes: No results for input(s): CKTOTAL, CKMB, CKMBINDEX, TROPONINI in the last 168 hours.  BNP: Invalid input(s): POCBNP  CBG: Recent Labs  Lab 12/31/17 0737 01/01/18 0742 01/02/18 0739 01/03/18 0758  GLUCAP 139* 109* 136* 117*    Microbiology: Results for orders placed or performed during the hospital encounter of 12/26/17  Urine culture     Status: Abnormal   Collection Time: 12/29/17 11:55 AM  Result Value Ref Range Status   Specimen Description   Final    URINE, CLEAN CATCH Performed at [facility], [address], [place], [place] [address]    Special  Requests   Final    NONE Performed at [facility], [address] [place], [place] [address]    Culture MULTIPLE SPECIES PRESENT, SUGGEST RECOLLECTION (A)  Final   Report Status 12/30/2017 FINAL  Final    Coagulation Studies: No results for input(s): LABPROT, INR in the last 72 hours.  Urinalysis: No results for input(s): COLORURINE, LABSPEC, PHURINE, GLUCOSEU, HGBUR, BILIRUBINUR, KETONESUR, PROTEINUR, UROBILINOGEN, NITRITE, LEUKOCYTESUR in the last 72 hours.  Invalid input(s): APPERANCEUR    Imaging: Dg Chest Port 1 View  Result Date: 01/02/2018 CLINICAL DATA:  Status post PICC line placement. EXAM: PORTABLE CHEST 1 VIEW COMPARISON:  Radiographs of Dec 27, 2017. FINDINGS: Stable cardiomediastinal silhouette. Stable mild central pulmonary vascular congestion. No pneumothorax or pleural effusion is noted. Right  internal jugular Port-A-Cath is unchanged in position. No PICC line is noted. Both lungs are clear. The visualized skeletal structures are unremarkable. IMPRESSION: Stable central pulmonary vascular congestion. Right internal jugular Port-A-Cath is unchanged in position no definite PICC line is noted. Electronically Signed   By: Lupita Raider, M.D.   On: 01/02/2018 16:22     Medications:   . albumin human 0 g (01/01/18 2230)  . furosemide (LASIX) infusion 3 mg/hr (01/02/18 0529)   . dextromethorphan  30 mg Oral BID  . docusate sodium  100 mg Oral BID  . guaiFENesin  600 mg Oral BID  . lactulose  30 g Oral TID  . mouth rinse  15 mL Mouth Rinse BID  . midodrine  10 mg Oral TID WC  . mupirocin ointment   Nasal BID  . nystatin   Topical BID  . pantoprazole  40 mg Oral BID  . predniSONE  50 mg Oral Q breakfast  . rifaximin  550 mg Oral BID  . senna  2 tablet Oral BID  . sodium chloride  1 g Oral BID WC  . spironolactone  25 mg Oral Daily   acetaminophen **OR** acetaminophen, bisacodyl, guaiFENesin-dextromethorphan, hydrocortisone cream, ipratropium-albuterol, lip balm, morphine injection, ondansetron **OR** ondansetron (ZOFRAN) IV, oxyCODONE-acetaminophen, oxymetazoline, sodium chloride, sodium chloride flush  Assessment/ Plan:  [name] is a 57 y.o. white male with cerebral palsy, diastolic heart failure, liver cirrhosis secondary to nonalcoholic fatty liver disease/hemochromatosis, lower extremity edema, generalized debility, who was admitted to [facility] on 12/14/2017 for evaluation of confusion.   1.  Hyponatremia.  2.  Hypotension. 3.  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 MELD score on 5/14 of 30 4.  Anemia unspecified.  Plan: Consistent with hypervolemic hypo-osmolar hyponatremia secondary to hepatic cirrhosis -Serum sodium remains low at 127.  Therefore we will increase Lasix drip to 6 mg/h.  Otherwise continue sodium chloride as well as spironolactone.  The  patient's hyponatremia will be difficult to treat given his underlying liver cirrhosis.  Target sodium is 128-130.   LOS: 8 Antonio York 5/31/201912:07 PM

## 2018-01-03 NOTE — Progress Notes (Signed)
Advanced care plan.  Purpose of the Encounter: CODE STATUS Parties in Attendance:Patient Patient's Decision Capacity:Good Subjective/Patient's story: Presented with abdominal discomfort and low sodium level Objective/Medical story Has hyponatremia, anasarca On lasix drip for diuresis Goals of care determination:  Advance care directives discussed with goals of care Patient wants everything done for now which includes cardiac resuscitation, intubation and ventilator if need arises. CODE STATUS: Full code Time spent discussing advanced care planning: 16 minutes

## 2018-01-04 ENCOUNTER — Inpatient Hospital Stay: Payer: Medicare Other

## 2018-01-04 LAB — BASIC METABOLIC PANEL
Anion gap: 7 (ref 5–15)
BUN: 29 mg/dL — AB (ref 6–20)
CHLORIDE: 96 mmol/L — AB (ref 101–111)
CO2: 26 mmol/L (ref 22–32)
CREATININE: 0.94 mg/dL (ref 0.61–1.24)
Calcium: 8.4 mg/dL — ABNORMAL LOW (ref 8.9–10.3)
GFR calc Af Amer: >60 mL/min (ref >60)
GFR calc non Af Amer: >60 mL/min (ref >60)
GLUCOSE: 151 mg/dL — AB (ref 65–99)
POTASSIUM: 4.1 mmol/L (ref 3.5–5.1)
Sodium: 129 mmol/L — ABNORMAL LOW (ref 135–145)

## 2018-01-04 LAB — CBC
HEMATOCRIT: 22.9 % — AB (ref 40.0–52.0)
Hemoglobin: 7.6 g/dL — ABNORMAL LOW (ref 13.0–18.0)
MCH: 35.4 pg — AB (ref 26.0–34.0)
MCHC: 33.3 g/dL (ref 32.0–36.0)
MCV: 106.2 fL — AB (ref 80.0–100.0)
PLATELETS: 106 10*3/uL — AB (ref 150–440)
RBC: 2.16 MIL/uL — ABNORMAL LOW (ref 4.40–5.90)
RDW: 18.1 % — AB (ref 11.5–14.5)
WBC: 12.6 10*3/uL — ABNORMAL HIGH (ref 3.8–10.6)

## 2018-01-04 LAB — GLUCOSE, CAPILLARY: Glucose-Capillary: 127 mg/dL — ABNORMAL HIGH (ref 65–99)

## 2018-01-04 NOTE — Progress Notes (Signed)
PT Cancellation Note  Patient Details Name: Antonio York MRN: [id number] DOB: 11/08/1960   Cancelled Treatment:    Reason Eval/Treat Not Completed: Pain limiting ability to participate   Pt in bed, resting with eyes closed.  Upon opening he began shaking his head "no."  Pt stated that his stomach was hurting and reported 10/10 pain.  He stated primary nurse was aware.  No needs voiced.  Pt declining session at this time.   [name] 01/04/2018, 9:06 AM

## 2018-01-04 NOTE — Progress Notes (Signed)
[facility]  ROUNDING NOTE   Subjective:  Patient seen at bedside.  Still on lasix gtt.  Na up to 129.     Objective:  Vital signs in last 24 hours:  Temp:  [98.2 F (36.8 C)-98.4 F (36.9 C)] 98.4 F (36.9 C) (06/01 0502) Pulse Rate:  [84-103] 103 (06/01 0502) Resp:  [16-21] 21 (06/01 0502) BP: (114-131)/(55-65) 131/65 (06/01 0502) SpO2:  [85 %-92 %] 85 % (06/01 0502) Weight:  [150 kg (330 lb 12.8 oz)] 150 kg (330 lb 12.8 oz) (06/01 0502)  Weight change: -0.091 kg (-3.2 oz) Filed Weights   01/02/18 0433 01/03/18 0349 01/04/18 0502  Weight: (!) 157.9 kg (348 lb) (!) 150.1 kg (331 lb) (!) 150 kg (330 lb 12.8 oz)    Intake/Output: I/O last 3 completed shifts: In: 0  Out: 2400 [Urine:2400]   Intake/Output this shift:  Total I/O In: 240 [P.O.:240] Out: -   Physical Exam: General: NAD, laying in bed  Head: Normocephalic, atraumatic. Moist oral mucosal membranes  Eyes: Icterus noted  Neck: Supple, trachea midline  Lungs:  Clear to auscultation, normal effort  Heart: Regular rate and rhythm  Abdomen:  Obese   Extremities: 1+ brawny peripheral edema, legs wrapped  Neurologic: Nonfocal, moving all four extremities  Skin: No lesions       Basic Metabolic Panel: Recent Labs  Lab 12/31/17 0516 01/01/18 0855 01/02/18 0432 01/03/18 0845 01/04/18 0356  NA 126* 126* 128* 127* 129*  K 4.2 3.8 4.1 3.9 4.1  CL 94* 94* 95* 94* 96*  CO2 25 23 25 24 26   GLUCOSE 136* 106* 126* 136* 151*  BUN 27* 29* 30* 30* 29*  CREATININE 1.14 1.19 1.09 1.02 0.94  CALCIUM 8.8* 8.5* 8.5* 8.4* 8.4*    Liver Function Tests: Recent Labs  Lab 12/29/17 0924 12/31/17 0516 01/02/18 0432  AST 82* 103* 72*  ALT 45 71* 65*  ALKPHOS 101 113 92  BILITOT 6.8* 6.6* 6.7*  PROT 6.1* 6.5 6.3*  ALBUMIN 2.8* 3.3* 3.6   No results for input(s): LIPASE, AMYLASE in the last 168 hours. No results for input(s): AMMONIA in the last 168 hours.  CBC: Recent Labs  Lab 12/30/17 0506  12/31/17 0516 01/01/18 0855 01/02/18 0432 01/02/18 2014 01/03/18 0209 01/03/18 0845 01/03/18 1503 01/04/18 0356  WBC 12.1* 10.7* 13.6* 14.1*  --   --   --   --  12.6*  HGB 7.6* 7.9* 7.6* 7.3* 7.4* 7.2* 7.5* 7.5* 7.6*  HCT 21.8* 22.8* 22.5* 21.6* 22.2* 21.4* 22.0* 22.1* 22.9*  MCV 103.8* 105.0* 105.5* 105.8*  --   --   --   --  106.2*  PLT 115* 109* 114* 109*  --   --   --   --  106*    Cardiac Enzymes: No results for input(s): CKTOTAL, CKMB, CKMBINDEX, TROPONINI in the last 168 hours.  BNP: Invalid input(s): POCBNP  CBG: Recent Labs  Lab 12/31/17 0737 01/01/18 0742 01/02/18 0739 01/03/18 0758 01/04/18 0739  GLUCAP 139* 109* 136* 117* 127*    Microbiology: Results for orders placed or performed during the hospital encounter of 12/26/17  Urine culture     Status: Abnormal   Collection Time: 12/29/17 11:55 AM  Result Value Ref Range Status   Specimen Description   Final    URINE, CLEAN CATCH Performed at [facility], [address] [place], [place] [address]    Special Requests   Final    NONE Performed at [facility]  Lab, [address] [place], [place] [address]    Culture MULTIPLE SPECIES PRESENT, SUGGEST RECOLLECTION (A)  Final   Report Status 12/30/2017 FINAL  Final    Coagulation Studies: No results for input(s): LABPROT, INR in the last 72 hours.  Urinalysis: No results for input(s): COLORURINE, LABSPEC, PHURINE, GLUCOSEU, HGBUR, BILIRUBINUR, KETONESUR, PROTEINUR, UROBILINOGEN, NITRITE, LEUKOCYTESUR in the last 72 hours.  Invalid input(s): APPERANCEUR    Imaging: Dg Chest Port 1 View  Result Date: 01/02/2018 CLINICAL DATA:  Status post PICC line placement. EXAM: PORTABLE CHEST 1 VIEW COMPARISON:  Radiographs of Dec 27, 2017. FINDINGS: Stable cardiomediastinal silhouette. Stable mild central pulmonary vascular congestion. No pneumothorax or pleural effusion is noted. Right internal jugular Port-A-Cath is unchanged in position. No  PICC line is noted. Both lungs are clear. The visualized skeletal structures are unremarkable. IMPRESSION: Stable central pulmonary vascular congestion. Right internal jugular Port-A-Cath is unchanged in position no definite PICC line is noted. Electronically Signed   By: Lupita Raider, M.D.   On: 01/02/2018 16:22     Medications:   . albumin human 0 g (01/03/18 2306)  . furosemide (LASIX) infusion Stopped (01/04/18 0355)   . dextromethorphan  30 mg Oral BID  . docusate sodium  100 mg Oral BID  . guaiFENesin  600 mg Oral BID  . lactulose  30 g Oral TID  . mouth rinse  15 mL Mouth Rinse BID  . midodrine  10 mg Oral TID WC  . mupirocin ointment   Nasal BID  . nystatin   Topical BID  . pantoprazole  40 mg Oral BID  . predniSONE  50 mg Oral Q breakfast  . rifaximin  550 mg Oral BID  . senna  2 tablet Oral BID  . sodium chloride  1 g Oral BID WC  . spironolactone  25 mg Oral Daily   acetaminophen **OR** acetaminophen, bisacodyl, guaiFENesin-dextromethorphan, hydrocortisone cream, ipratropium-albuterol, lip balm, morphine injection, ondansetron **OR** ondansetron (ZOFRAN) IV, oxyCODONE-acetaminophen, oxymetazoline, sodium chloride, sodium chloride flush  Assessment/ Plan:  [name] is a 57 y.o. white male with cerebral palsy, diastolic heart failure, liver cirrhosis secondary to nonalcoholic fatty liver disease/hemochromatosis, lower extremity edema, generalized debility, who was admitted to [facility] on 12/14/2017 for evaluation of confusion.   1.  Hyponatremia.  2.  Hypotension. 3.  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 MELD score on 5/14 of 30 4.  Anemia unspecified.  Plan: Consistent with hypervolemic hypo-osmolar hyponatremia secondary to hepatic cirrhosis -Serum sodium has been trending up slowly.  Currently sodium up to 129.  We will continue Lasix drip for 1 additional day in addition to sodium chloride 1 g p.o. twice daily as well as spinal lactone 25 mg  daily.  Overall patient remains quite weak.  Overall prognosis guarded.   LOS: 9 Antonio York 6/1/201912:38 PM

## 2018-01-04 NOTE — Progress Notes (Signed)
[facility] [place] [facility]   PATIENT NAME: Antonio York    MR#:  [id number]  DATE OF BIRTH:  07/07/1961  SUBJECTIVE:  CHIEF COMPLAINT:   Chief Complaint  Patient presents with  . Abdominal Pain   -Morbidly obese patient, bedbound now.  Remains on Lasix drip -Sodium at 129 today.  Complains of generalized abdominal pain  REVIEW OF SYSTEMS:  Review of Systems  Constitutional: Positive for malaise/fatigue. Negative for chills and fever.  HENT: Negative for congestion, ear discharge, hearing loss and nosebleeds.   Eyes: Negative for blurred vision and double vision.  Respiratory: Negative for cough, shortness of breath and wheezing.   Cardiovascular: Negative for chest pain and palpitations.  Gastrointestinal: Positive for abdominal pain. Negative for constipation, diarrhea, nausea and vomiting.  Genitourinary: Negative for dysuria.  Musculoskeletal: Positive for myalgias.  Neurological: Negative for dizziness, seizures and headaches.    DRUG ALLERGIES:  No Known Allergies  VITALS:  Blood pressure 131/65, pulse (!) 103, temperature 98.4 F (36.9 C), resp. rate (!) 21, height 5\' 6"  (1.676 m), weight (!) 150 kg (330 lb 12.8 oz), SpO2 (!) 85 %.  PHYSICAL EXAMINATION:  Physical Exam  GENERAL:  57 y.o.-year-old morbidly obese patient lying in the bed with no acute distress.  EYES: Pupils equal, round, reactive to light and accommodation. No scleral icterus. Extraocular muscles intact.  HEENT: Head atraumatic, normocephalic. Oropharynx and nasopharynx clear.  NECK:  Supple, no jugular venous distention. No thyroid enlargement, no tenderness.  LUNGS: Normal breath sounds bilaterally, no wheezing, rales,rhonchi or crepitation. No use of accessory muscles of respiration.  Decreased bibasilar breath sounds CARDIOVASCULAR: S1, S2 normal. No murmurs, rubs, or gallops.  ABDOMEN: Soft, obese, nontender, nondistended. Bowel sounds present. No organomegaly or  mass.  EXTREMITIES: No cyanosis, or clubbing.  2+ pedal edema NEUROLOGIC: Cranial nerves II through XII are intact. Muscle strength equal in all extremities.  Generalized weakness noted. sensation intact. Gait not checked.  PSYCHIATRIC: The patient is alert and oriented x 3.  SKIN: No obvious rash, lesion, or ulcer.    LABORATORY PANEL:   CBC Recent Labs  Lab 01/04/18 0356  WBC 12.6*  HGB 7.6*  HCT 22.9*  PLT 106*   ------------------------------------------------------------------------------------------------------------------  Chemistries  Recent Labs  Lab 01/02/18 0432  01/04/18 0356  NA 128*   < > 129*  K 4.1   < > 4.1  CL 95*   < > 96*  CO2 25   < > 26  GLUCOSE 126*   < > 151*  BUN 30*   < > 29*  CREATININE 1.09   < > 0.94  CALCIUM 8.5*   < > 8.4*  AST 72*  --   --   ALT 65*  --   --   ALKPHOS 92  --   --   BILITOT 6.7*  --   --    < > = values in this interval not displayed.   ------------------------------------------------------------------------------------------------------------------  Cardiac Enzymes No results for input(s): TROPONINI in the last 168 hours. ------------------------------------------------------------------------------------------------------------------  RADIOLOGY:  Dg Chest Port 1 View  Result Date: 01/02/2018 CLINICAL DATA:  Status post PICC line placement. EXAM: PORTABLE CHEST 1 VIEW COMPARISON:  Radiographs of Dec 27, 2017. FINDINGS: Stable cardiomediastinal silhouette. Stable mild central pulmonary vascular congestion. No pneumothorax or pleural effusion is noted. Right internal jugular Port-A-Cath is unchanged in position. No PICC line is noted. Both lungs are clear. The visualized skeletal structures are unremarkable. IMPRESSION: Stable central  pulmonary vascular congestion. Right internal jugular Port-A-Cath is unchanged in position no definite PICC line is noted. Electronically Signed   By: Lupita Raider, M.D.   On: 01/02/2018  16:22    EKG:   Orders placed or performed during the hospital encounter of 12/26/17  . ED EKG  . ED EKG  . EKG 12-Lead  . EKG 12-Lead    ASSESSMENT AND PLAN:   57 year old male with past medical history significant for anemia of chronic disease, nonalcoholic liver cirrhosis, secondary hemochromatosis presents with hyponatremia and anasarca  1.  Hyponatremia-secondary hypervolemic status and underlying liver cirrhosis -Appreciate nephrology consult.  Patient on IV Lasix drip. -Sodium at 129. -Negative of 6 L since admission.  Likely Lasix can be changed to IV today  2.  Abdominal pain-could be constipation.  No tenderness on exam.  Will get a KUB  3.  Nonalcoholic liver cirrhosis-prior GI evaluation done.  Outpatient MRCP and [facility] follow-up as recommended. -Has elevated baseline bilirubin  4.  Anemia of chronic disease-with secondary hemochromatosis -Appreciate hematology consult.  Transfuse if hemoglobin is less than 7 -No indication for any phlebotomy for ferritin levels. -Hemoglobin is stable around 7.5  5.  Epistaxis-off blood thinners.  Appreciate ENT evaluation.  Resolved at this time. -Has chronic thrombocytopenia from his cirrhosis  6.  Physical deconditioning-will need rehab placement.  Patient will be discharged to [facility] [place] once medically stable.    All the records are reviewed and case discussed with Care Management/Social Workerr. Management plans discussed with the patient, family and they are in agreement.  CODE STATUS: Full code  TOTAL TIME TAKING CARE OF THIS PATIENT: 39 minutes.   POSSIBLE D/C IN 2-3 DAYS, DEPENDING ON CLINICAL CONDITION.   Enid Baas M.D on 01/04/2018 at 11:56 AM  Between 7am to 6pm - Pager - [phone]  After 6pm go to [email or web address] - password [email or web address]  [facility]  Office  [phone]  CC: Primary care physician; Dimple Casey, MD

## 2018-01-05 LAB — BASIC METABOLIC PANEL
ANION GAP: 9 (ref 5–15)
BUN: 27 mg/dL — ABNORMAL HIGH (ref 6–20)
CALCIUM: 8.6 mg/dL — AB (ref 8.9–10.3)
CHLORIDE: 97 mmol/L — AB (ref 101–111)
CO2: 25 mmol/L (ref 22–32)
Creatinine, Ser: 0.75 mg/dL (ref 0.61–1.24)
GFR calc non Af Amer: >60 mL/min (ref >60)
Glucose, Bld: 120 mg/dL — ABNORMAL HIGH (ref 65–99)
Potassium: 4.1 mmol/L (ref 3.5–5.1)
SODIUM: 131 mmol/L — AB (ref 135–145)

## 2018-01-05 LAB — GLUCOSE, CAPILLARY: Glucose-Capillary: 105 mg/dL — ABNORMAL HIGH (ref 65–99)

## 2018-01-05 MED ORDER — FLEET ENEMA 7-19 GM/118ML RE ENEM: 1.0000 | ENEMA | Freq: Once | RECTAL | Status: DC

## 2018-01-05 MED ORDER — BISACODYL 5 MG PO TBEC
10.0000 mg | DELAYED_RELEASE_TABLET | Freq: Every day | ORAL | Status: DC
  Administered 2018-01-05 – 2018-01-06 (×2): 10 mg via ORAL
  Filled 2018-01-05 (×2): qty 2

## 2018-01-05 NOTE — Progress Notes (Signed)
[facility] - [place] [facility]   PATIENT NAME: Antonio York    MR#:  [id number]  DATE OF BIRTH:  1960/10/06  SUBJECTIVE:  CHIEF COMPLAINT:   Chief Complaint  Patient presents with  . Abdominal Pain   -Complaints of significant abdominal pain.  Sodium at 131 -KUB showing stool burden and left-sided colon  REVIEW OF SYSTEMS:  Review of Systems  Constitutional: Positive for malaise/fatigue. Negative for chills and fever.  HENT: Negative for congestion, ear discharge, hearing loss and nosebleeds.   Eyes: Negative for blurred vision and double vision.  Respiratory: Negative for cough, shortness of breath and wheezing.   Cardiovascular: Negative for chest pain and palpitations.  Gastrointestinal: Positive for abdominal pain. Negative for constipation, diarrhea, nausea and vomiting.  Genitourinary: Negative for dysuria.  Musculoskeletal: Positive for myalgias.  Neurological: Negative for dizziness, seizures and headaches.    DRUG ALLERGIES:  No Known Allergies  VITALS:  Blood pressure (!) 128/51, pulse (!) 106, temperature 98.2 F (36.8 C), temperature source Oral, resp. rate 20, height 5\' 6"  (1.676 m), weight (!) 150 kg (330 lb 12.8 oz), SpO2 93 %.  PHYSICAL EXAMINATION:  Physical Exam  GENERAL:  57 y.o.-year-old morbidly obese patient lying in the bed with no acute distress.  EYES: Pupils equal, round, reactive to light and accommodation. No scleral icterus. Extraocular muscles intact.  HEENT: Head atraumatic, normocephalic. Oropharynx and nasopharynx clear.  NECK:  Supple, no jugular venous distention. No thyroid enlargement, no tenderness.  LUNGS: Normal breath sounds bilaterally, no wheezing, rales,rhonchi or crepitation. No use of accessory muscles of respiration.  Decreased bibasilar breath sounds CARDIOVASCULAR: S1, S2 normal. No murmurs, rubs, or gallops.  ABDOMEN: Soft, obese, nontender, nondistended. Bowel sounds present. No organomegaly or  mass.  EXTREMITIES: No cyanosis, or clubbing.  2+ pedal edema NEUROLOGIC: Cranial nerves II through XII are intact. Muscle strength equal in all extremities.  Generalized weakness noted. sensation intact. Gait not checked.  PSYCHIATRIC: The patient is alert and oriented x 3.  SKIN: No obvious rash, lesion, or ulcer.    LABORATORY PANEL:   CBC Recent Labs  Lab 01/04/18 0356  WBC 12.6*  HGB 7.6*  HCT 22.9*  PLT 106*   ------------------------------------------------------------------------------------------------------------------  Chemistries  Recent Labs  Lab 01/02/18 0432  01/05/18 0404  NA 128*   < > 131*  K 4.1   < > 4.1  CL 95*   < > 97*  CO2 25   < > 25  GLUCOSE 126*   < > 120*  BUN 30*   < > 27*  CREATININE 1.09   < > 0.75  CALCIUM 8.5*   < > 8.6*  AST 72*  --   --   ALT 65*  --   --   ALKPHOS 92  --   --   BILITOT 6.7*  --   --    < > = values in this interval not displayed.   ------------------------------------------------------------------------------------------------------------------  Cardiac Enzymes No results for input(s): TROPONINI in the last 168 hours. ------------------------------------------------------------------------------------------------------------------  RADIOLOGY:  Dg Abd 1 View  Result Date: 01/04/2018 CLINICAL DATA:  57 year old male with abdominal pain EXAM: ABDOMEN - 1 VIEW COMPARISON:  Prior abdominal radiographs 12/29/2016 FINDINGS: Multiple radiographs were obtained in order to encompass the entirety of the patient's abdomen. No evidence of bowel obstruction. There is a moderately high volume of formed stool throughout the left colon. Surgical clips present in the right upper quadrant. IMPRESSION: 1. No evidence of bowel  obstruction. 2. Moderately large volume of formed stool throughout the left colon. Electronically Signed   By: Malachy MoanHeath  McCullough M.D.   On: 01/04/2018 14:21    EKG:   Orders placed or performed during the  hospital encounter of 12/26/17  . ED EKG  . ED EKG  . EKG 12-Lead  . EKG 12-Lead    ASSESSMENT AND PLAN:   57 year old male with past medical history significant for anemia of chronic disease, nonalcoholic liver cirrhosis, secondary hemochromatosis presents with hyponatremia and anasarca  1.  Hyponatremia-secondary hypervolemic status and underlying liver cirrhosis -Appreciate nephrology consult.   -Patient on IV Lasix drip.  Continue for 1 more day.  Sodium at 131. -At home takes torsemide once a day, might need to be increased at discharge. -Negative of 6.6 L since admission.   2.  Abdominal pain-could be constipation.  KUB confirming stool burden in left-sided colon.  Already on lactulose.  Added laxatives and enema as needed  3.  Nonalcoholic liver cirrhosis-prior GI evaluation done.  Outpatient MRCP and [facility] follow-up as recommended. -Has elevated baseline bilirubin  4.  Anemia of chronic disease-with secondary hemochromatosis -Appreciate hematology consult.  Transfuse if hemoglobin is less than 7 -No indication for any phlebotomy for ferritin levels. -Hemoglobin is stable around 7.5  5.  Epistaxis-off blood thinners.  Appreciate ENT evaluation.  Resolved at this time. -Has chronic thrombocytopenia from his cirrhosis  6.  Physical deconditioning-will need rehab placement.  Patient [facility] [place] once medically stable.    All the records are reviewed and case discussed with Care Management/Social Workerr. Management plans discussed with the patient, family and they are in agreement.  CODE STATUS: Full code  TOTAL TIME TAKING CARE OF THIS PATIENT: 34 minutes.   POSSIBLE D/C IN 1-2  DAYS, DEPENDING ON CLINICAL CONDITION.   Enid BaasKALISETTI,Diahann Guajardo M.D on 01/05/2018 at 12:55 PM  Between 7am to 6pm - Pager - [phone]  After 6pm go to [email or web address] - password [email or web address]  [facility]  Office  [phone]  CC: Primary  care physician; Dimple CaseySmith, Sean A, MD

## 2018-01-05 NOTE — Plan of Care (Signed)
  Problem: Clinical Measurements: Goal: Will remain free from infection Outcome: Progressing Goal: Diagnostic test results will improve Outcome: Progressing Goal: Respiratory complications will improve Outcome: Progressing   Problem: Nutrition: Goal: Adequate nutrition will be maintained Outcome: Progressing   Problem: Coping: Goal: Level of anxiety will decrease Outcome: Progressing   Problem: Elimination: Goal: Will not experience complications related to bowel motility Outcome: Progressing Goal: Will not experience complications related to urinary retention Outcome: Progressing   Problem: Pain Managment: Goal: General experience of comfort will improve Outcome: Progressing   Problem: Safety: Goal: Ability to remain free from injury will improve Outcome: Progressing   Problem: Skin Integrity: Goal: Risk for impaired skin integrity will decrease Outcome: Progressing   Problem: Health Behavior/Discharge Planning: Goal: Ability to manage health-related needs will improve Outcome: Not Progressing   Problem: Activity: Goal: Risk for activity intolerance will decrease Outcome: Not Progressing   Problem: Education: Goal: Knowledge of General Education information will improve Outcome: Completed/Met   Problem: Clinical Measurements: Goal: Cardiovascular complication will be avoided Outcome: Completed/Met

## 2018-01-05 NOTE — Plan of Care (Signed)

## 2018-01-05 NOTE — Progress Notes (Signed)
[facility]  ROUNDING NOTE   Subjective:  Urine output was 1.5 L over the preceding 24 hours. Serum sodium of 131. Patient resting comfortably in bed at the moment.   Objective:  Vital signs in last 24 hours:  Temp:  [97.7 F (36.5 C)-98.5 F (36.9 C)] 97.8 F (36.6 C) (06/02 0411) Pulse Rate:  [94-107] 107 (06/02 0411) Resp:  [15-16] 15 (06/02 0411) BP: (119-138)/(44-54) 119/44 (06/02 0411) SpO2:  [93 %-98 %] 93 % (06/02 0411)  Weight change:  Filed Weights   01/02/18 0433 01/03/18 0349 01/04/18 0502  Weight: (!) 157.9 kg (348 lb) (!) 150.1 kg (331 lb) (!) 150 kg (330 lb 12.8 oz)    Intake/Output: I/O last 3 completed shifts: In: 1346.4 [P.O.:240; I.V.:356.4; IV Piggyback:750] Out: 1550 [Urine:1550]   Intake/Output this shift:  No intake/output data recorded.  Physical Exam: General: NAD, laying in bed  Head: Normocephalic, atraumatic. Moist oral mucosal membranes  Eyes: Icterus noted  Neck: Supple, trachea midline  Lungs:  Clear to auscultation, normal effort  Heart: Regular rate and rhythm  Abdomen:  Obese   Extremities: 1+ brawny peripheral edema, legs wrapped  Neurologic: Nonfocal, moving all four extremities  Skin: No lesions       Basic Metabolic Panel: Recent Labs  Lab 01/01/18 0855 01/02/18 0432 01/03/18 0845 01/04/18 0356 01/05/18 0404  NA 126* 128* 127* 129* 131*  K 3.8 4.1 3.9 4.1 4.1  CL 94* 95* 94* 96* 97*  CO2 23 25 24 26 25   GLUCOSE 106* 126* 136* 151* 120*  BUN 29* 30* 30* 29* 27*  CREATININE 1.19 1.09 1.02 0.94 0.75  CALCIUM 8.5* 8.5* 8.4* 8.4* 8.6*    Liver Function Tests: Recent Labs  Lab 12/31/17 0516 01/02/18 0432  AST 103* 72*  ALT 71* 65*  ALKPHOS 113 92  BILITOT 6.6* 6.7*  PROT 6.5 6.3*  ALBUMIN 3.3* 3.6   No results for input(s): LIPASE, AMYLASE in the last 168 hours. No results for input(s): AMMONIA in the last 168 hours.  CBC: Recent Labs  Lab 12/30/17 0506 12/31/17 0516 01/01/18 0855  01/02/18 0432 01/02/18 2014 01/03/18 0209 01/03/18 0845 01/03/18 1503 01/04/18 0356  WBC 12.1* 10.7* 13.6* 14.1*  --   --   --   --  12.6*  HGB 7.6* 7.9* 7.6* 7.3* 7.4* 7.2* 7.5* 7.5* 7.6*  HCT 21.8* 22.8* 22.5* 21.6* 22.2* 21.4* 22.0* 22.1* 22.9*  MCV 103.8* 105.0* 105.5* 105.8*  --   --   --   --  106.2*  PLT 115* 109* 114* 109*  --   --   --   --  106*    Cardiac Enzymes: No results for input(s): CKTOTAL, CKMB, CKMBINDEX, TROPONINI in the last 168 hours.  BNP: Invalid input(s): POCBNP  CBG: Recent Labs  Lab 01/01/18 0742 01/02/18 0739 01/03/18 0758 01/04/18 0739 01/05/18 0737  GLUCAP 109* 136* 117* 127* 105*    Microbiology: Results for orders placed or performed during the hospital encounter of 12/26/17  Urine culture     Status: Abnormal   Collection Time: 12/29/17 11:55 AM  Result Value Ref Range Status   Specimen Description   Final    URINE, CLEAN CATCH Performed at [facility], [address], [place], [place] [address]    Special Requests   Final    NONE Performed at [facility], [address], [place], [place] [address]    Culture MULTIPLE SPECIES PRESENT, SUGGEST RECOLLECTION (A)  Final  Report Status 12/30/2017 FINAL  Final    Coagulation Studies: No results for input(s): LABPROT, INR in the last 72 hours.  Urinalysis: No results for input(s): COLORURINE, LABSPEC, PHURINE, GLUCOSEU, HGBUR, BILIRUBINUR, KETONESUR, PROTEINUR, UROBILINOGEN, NITRITE, LEUKOCYTESUR in the last 72 hours.  Invalid input(s): APPERANCEUR    Imaging: Dg Abd 1 View  Result Date: 01/04/2018 CLINICAL DATA:  57 year old male with abdominal pain EXAM: ABDOMEN - 1 VIEW COMPARISON:  Prior abdominal radiographs 12/29/2016 FINDINGS: Multiple radiographs were obtained in order to encompass the entirety of the patient's abdomen. No evidence of bowel obstruction. There is a moderately high volume of formed stool throughout the left colon. Surgical clips present  in the right upper quadrant. IMPRESSION: 1. No evidence of bowel obstruction. 2. Moderately large volume of formed stool throughout the left colon. Electronically Signed   By: Malachy MoanHeath  McCullough M.D.   On: 01/04/2018 14:21     Medications:   . albumin human 0 g (01/03/18 2306)  . furosemide (LASIX) infusion Stopped (01/05/18 0402)   . bisacodyl  10 mg Oral Daily  . dextromethorphan  30 mg Oral BID  . docusate sodium  100 mg Oral BID  . guaiFENesin  600 mg Oral BID  . lactulose  30 g Oral TID  . mouth rinse  15 mL Mouth Rinse BID  . midodrine  10 mg Oral TID WC  . mupirocin ointment   Nasal BID  . nystatin   Topical BID  . pantoprazole  40 mg Oral BID  . predniSONE  50 mg Oral Q breakfast  . rifaximin  550 mg Oral BID  . senna  2 tablet Oral BID  . sodium chloride  1 g Oral BID WC  . spironolactone  25 mg Oral Daily   acetaminophen **OR** acetaminophen, guaiFENesin-dextromethorphan, hydrocortisone cream, ipratropium-albuterol, lip balm, morphine injection, ondansetron **OR** ondansetron (ZOFRAN) IV, oxyCODONE-acetaminophen, oxymetazoline, sodium chloride, sodium chloride flush  Assessment/ Plan:  Mr. Antonio BaarsJessie York is a 57 y.o. white male with cerebral palsy, diastolic heart failure, liver cirrhosis secondary to nonalcoholic fatty liver disease/hemochromatosis, lower extremity edema, generalized debility, who [facility] for evaluation of confusion.   1.  Hyponatremia.  2.  Hypotension. 3.  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 MELD score on 5/14 of 30 4.  Anemia unspecified.  Plan: Consistent with hypervolemic hypo-osmolar hyponatremia secondary to hepatic cirrhosis - patient appears to be responding well to Lasix drip along with a combination of sodium chloride 1 g twice a day.  He is also on spironolactone 25 mg daily.  Continue Lasix drip one additional day.  He was on torsemideat home as well.  Further plan as per hospitalist.   LOS:  10 Syed Zukas 6/2/201912:19 PM

## 2018-01-06 LAB — BASIC METABOLIC PANEL
ANION GAP: 10 (ref 5–15)
BUN: 28 mg/dL — ABNORMAL HIGH (ref 6–20)
CALCIUM: 8.8 mg/dL — AB (ref 8.9–10.3)
CO2: 26 mmol/L (ref 22–32)
CREATININE: 0.75 mg/dL (ref 0.61–1.24)
Chloride: 96 mmol/L — ABNORMAL LOW (ref 101–111)
GFR calc non Af Amer: >60 mL/min (ref >60)
Glucose, Bld: 135 mg/dL — ABNORMAL HIGH (ref 65–99)
Potassium: 3.9 mmol/L (ref 3.5–5.1)
SODIUM: 132 mmol/L — AB (ref 135–145)

## 2018-01-06 LAB — GLUCOSE, CAPILLARY: Glucose-Capillary: 118 mg/dL — ABNORMAL HIGH (ref 65–99)

## 2018-01-06 MED ORDER — SODIUM CHLORIDE 1 G PO TABS: 1.0000 g | ORAL_TABLET | Freq: Two times a day (BID) | ORAL | 2 refills | Status: AC

## 2018-01-06 MED ORDER — PANTOPRAZOLE SODIUM 40 MG PO TBEC: 40.0000 mg | DELAYED_RELEASE_TABLET | Freq: Every day | ORAL | Status: AC

## 2018-01-06 MED ORDER — HEPARIN SOD (PORK) LOCK FLUSH 100 UNIT/ML IV SOLN
500.0000 [IU] | Freq: Once | INTRAVENOUS | Status: AC
  Administered 2018-01-06: 12:00:00 500 [IU] via INTRAVENOUS
  Filled 2018-01-06: qty 5

## 2018-01-06 MED ORDER — TORSEMIDE 20 MG PO TABS: 40.0000 mg | ORAL_TABLET | Freq: Two times a day (BID) | ORAL | Status: DC

## 2018-01-06 NOTE — Care Management Important Message (Signed)
Important Message  Patient Details  Name: Antonio York MRN: [id number] Date of Birth: 09/15/1960   Medicare Important Message Given:  Yes    Olegario MessierKathy A Hiyab Nhem 01/06/2018, 10:34 AM

## 2018-01-06 NOTE — Progress Notes (Signed)
PT Cancellation Note  Patient Details Name: Antonio York MRN: [id number] DOB: 12/27/1960   Cancelled Treatment:    Reason Eval/Treat Not Completed: Fatigue/lethargy limiting ability to participate;Patient declined, no reason specified. Treatment attempted; pt very lethargic, but does awaken to voice. Pt reports "not feeling well" today and has difficulty keeping eyes open during conversation. Despite encouragement, pt refuses PT at this time. Re attempt at a later time/date, as the schedule allows.    Scot DockHeidi E Olyvia Gopal, PTA 01/06/2018, 11:16 AM

## 2018-01-06 NOTE — Progress Notes (Signed)
[facility]  ROUNDING NOTE   Subjective:   Na 132  Objective:  Vital signs in last 24 hours:  Temp:  [97.6 F (36.4 C)-98.2 F (36.8 C)] 97.9 F (36.6 C) (06/03 0441) Pulse Rate:  [103-106] 105 (06/03 0441) Resp:  [17-24] 24 (06/03 0441) BP: (124-133)/(51-79) 124/55 (06/03 0441) SpO2:  [93 %-98 %] 95 % (06/03 0441) Weight:  [145.8 kg (321 lb 6.4 oz)] 145.8 kg (321 lb 6.4 oz) (06/03 0441)  Weight change:  Filed Weights   01/03/18 0349 01/04/18 0502 01/06/18 0441  Weight: (!) 150.1 kg (331 lb) (!) 150 kg (330 lb 12.8 oz) (!) 145.8 kg (321 lb 6.4 oz)    Intake/Output: I/O last 3 completed shifts: In: 1708.3 [P.O.:235; I.V.:573.3; IV Piggyback:900] Out: 3250 [Urine:3250]   Intake/Output this shift:  Total I/O In: -  Out: 1000 [Urine:1000]  Physical Exam: General: NAD, laying in bed  Head: Normocephalic, atraumatic. Moist oral mucosal membranes  Eyes: Icterus noted  Neck: Supple, trachea midline  Lungs:  Clear to auscultation, normal effort  Heart: Regular rate and rhythm  Abdomen:  Obese   Extremities: 1+ brawny peripheral edema, legs wrapped  Neurologic: Nonfocal, moving all four extremities  Skin: No lesions       Basic Metabolic Panel: Recent Labs  Lab 01/02/18 0432 01/03/18 0845 01/04/18 0356 01/05/18 0404 01/06/18 0941  NA 128* 127* 129* 131* 132*  K 4.1 3.9 4.1 4.1 3.9  CL 95* 94* 96* 97* 96*  CO2 25 24 26 25 26   GLUCOSE 126* 136* 151* 120* 135*  BUN 30* 30* 29* 27* 28*  CREATININE 1.09 1.02 0.94 0.75 0.75  CALCIUM 8.5* 8.4* 8.4* 8.6* 8.8*    Liver Function Tests: Recent Labs  Lab 12/31/17 0516 01/02/18 0432  AST 103* 72*  ALT 71* 65*  ALKPHOS 113 92  BILITOT 6.6* 6.7*  PROT 6.5 6.3*  ALBUMIN 3.3* 3.6   No results for input(s): LIPASE, AMYLASE in the last 168 hours. No results for input(s): AMMONIA in the last 168 hours.  CBC: Recent Labs  Lab 12/31/17 0516 01/01/18 0855 01/02/18 0432 01/02/18 2014 01/03/18 0209  01/03/18 0845 01/03/18 1503 01/04/18 0356  WBC 10.7* 13.6* 14.1*  --   --   --   --  12.6*  HGB 7.9* 7.6* 7.3* 7.4* 7.2* 7.5* 7.5* 7.6*  HCT 22.8* 22.5* 21.6* 22.2* 21.4* 22.0* 22.1* 22.9*  MCV 105.0* 105.5* 105.8*  --   --   --   --  106.2*  PLT 109* 114* 109*  --   --   --   --  106*    Cardiac Enzymes: No results for input(s): CKTOTAL, CKMB, CKMBINDEX, TROPONINI in the last 168 hours.  BNP: Invalid input(s): POCBNP  CBG: Recent Labs  Lab 01/02/18 0739 01/03/18 0758 01/04/18 0739 01/05/18 0737 01/06/18 0751  GLUCAP 136* 117* 127* 105* 118*    Microbiology: Results for orders placed or performed during the hospital encounter of 12/26/17  Urine culture     Status: Abnormal   Collection Time: 12/29/17 11:55 AM  Result Value Ref Range Status   Specimen Description   Final    URINE, CLEAN CATCH Performed at [facility], [address] [place], [place] [address]    Special Requests   Final    NONE Performed at [facility], [address] [place], [place] [address]    Culture MULTIPLE SPECIES PRESENT, SUGGEST RECOLLECTION (A)  Final   Report Status 12/30/2017 FINAL  Final    Coagulation Studies: No results for input(s): LABPROT, INR in the last 72 hours.  Urinalysis: No results for input(s): COLORURINE, LABSPEC, PHURINE, GLUCOSEU, HGBUR, BILIRUBINUR, KETONESUR, PROTEINUR, UROBILINOGEN, NITRITE, LEUKOCYTESUR in the last 72 hours.  Invalid input(s): APPERANCEUR    Imaging: Dg Abd 1 View  Result Date: 01/04/2018 CLINICAL DATA:  57 year old male with abdominal pain EXAM: ABDOMEN - 1 VIEW COMPARISON:  Prior abdominal radiographs 12/29/2016 FINDINGS: Multiple radiographs were obtained in order to encompass the entirety of the patient's abdomen. No evidence of bowel obstruction. There is a moderately high volume of formed stool throughout the left colon. Surgical clips present in the right upper quadrant. IMPRESSION: 1. No evidence of bowel  obstruction. 2. Moderately large volume of formed stool throughout the left colon. Electronically Signed   By: Malachy MoanHeath  McCullough M.D.   On: 01/04/2018 14:21     Medications:    . bisacodyl  10 mg Oral Daily  . dextromethorphan  30 mg Oral BID  . docusate sodium  100 mg Oral BID  . guaiFENesin  600 mg Oral BID  . lactulose  30 g Oral TID  . mouth rinse  15 mL Mouth Rinse BID  . midodrine  10 mg Oral TID WC  . mupirocin ointment   Nasal BID  . nystatin   Topical BID  . pantoprazole  40 mg Oral BID  . predniSONE  50 mg Oral Q breakfast  . rifaximin  550 mg Oral BID  . senna  2 tablet Oral BID  . sodium chloride  1 g Oral BID WC  . sodium phosphate  1 enema Rectal Once  . spironolactone  25 mg Oral Daily  . torsemide  40 mg Oral BID   acetaminophen **OR** acetaminophen, guaiFENesin-dextromethorphan, hydrocortisone cream, ipratropium-albuterol, lip balm, morphine injection, ondansetron **OR** ondansetron (ZOFRAN) IV, oxyCODONE-acetaminophen, oxymetazoline, sodium chloride, sodium chloride flush  Assessment/ Plan:  Antonio York is a 57 y.o. 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   1.  Hyponatremia.  2.  Hypotension. 3.  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 MELD score on 5/14 of 30 4.  Anemia unspecified.  Plan: Discontinued furosemide gtt Transition to PO torsemide and spironolactone.    LOS: 11 Shardea Cwynar 6/3/201912:34 PM

## 2018-01-06 NOTE — Progress Notes (Signed)
[facility], report called called to RN at facility who will be assuming his care. All belongings were packed and returned to the patient. Chest port was deaccessed, pt was changed into a transfer pack and ems was notified of transport needed.

## 2018-01-06 NOTE — Discharge Summary (Signed)
[employer] [facility] [place] at [facility]   PATIENT NAME: Antonio York    MR#:  [id number]  DATE OF BIRTH:  04/25/1961  DATE OF ADMISSION:  12/26/2017   ADMITTING PHYSICIAN: Houston SirenVivek J Sainani, MD  DATE OF DISCHARGE:  01/06/18  PRIMARY CARE PHYSICIAN: Dimple CaseySmith, Sean A, MD   ADMISSION DIAGNOSIS:   Hepatic encephalopathy (HCC) [K72.90] Elevated bilirubin [R17]  DISCHARGE DIAGNOSIS:   Principal Problem:   Adjustment disorder with disturbance of emotion Active Problems:   Hyponatremia   Hepatic encephalopathy (HCC)   Pressure injury of skin   Elevated bilirubin   SECONDARY DIAGNOSIS:   Past Medical History:  Diagnosis Date  . Asthma   . Cerebral palsy (HCC)   . CHF (congestive heart failure) (HCC)   . COPD (chronic obstructive pulmonary disease) (HCC)   . Liver cirrhosis [facility](HCC)     HOSPITAL COURSE:   57 year old male with past medical history significant for anemia of chronic disease, nonalcoholic liver cirrhosis, secondary hemochromatosis presents with hyponatremia and anasarca  1.  Hyponatremia-secondary to hypervolemic status and underlying liver cirrhosis -Appreciate nephrology consult.   -Patient was on IV Lasix drip.   Sodium at 132. - net negative by almost 9 liters since admission -discharge on torsemide 40mg  BID and outpatient follow-up with nephrology within 1 week - continue salt tabs started in the hospital -Anasarca with bilateral lower extremity Unna wraps-change every week  2.  Abdominal pain-secondary to constipation.  KUB confirming stool burden in left-sided colon.   -Pain and distention improved with laxatives  3.  Nonalcoholic liver cirrhosis-prior GI evaluation done.  [facility]  re[facility]. -Has elevated baseline bilirubin  4.  Anemia of chronic disease-with secondary hemochromatosis -Appreciate hematology consult.  Transfuse if hemoglobin is less than 7 -No indication for any phlebotomy for low ferritin  levels. -Hemoglobin is stable around 7.5  5.  Epistaxis-off blood thinners.  Appreciate ENT evaluation.  Resolved at this time. -Has chronic thrombocytopenia from his cirrhosis  6.  Physical deconditioning- rehab placement.  [facility] [place] [facility]    DISCHARGE CONDITIONS:   Critical  CONSULTS OBTAINED:   Treatment Team:  Mosetta PigeonSingh, Harmeet, MD Clapacs, Jackquline DenmarkJohn T, MD Lucy Chrisook, Steven, MD Rosey Bathorcoran, Melissa C, MD Bud FaceVaught, Creighton, MD  DRUG ALLERGIES:   No Known Allergies DISCHARGE MEDICATIONS:   Allergies as of 01/06/2018   No Known Allergies     Medication List    STOP taking these medications   TUBERSOL 5 UNIT/0.1ML injection Generic drug:  tuberculin     TAKE these medications   diclofenac sodium 1 % Gel Commonly known as:  VOLTAREN Apply 4 g topically 4 (four) times daily as needed (back pain).   hydrocortisone cream 1 % Apply 1 application topically every 6 (six) hours as needed for itching. Apply to abdomen.   lactulose 10 GM/15ML solution Commonly known as:  CHRONULAC Take 45 mLs (30 g total) by mouth 3 (three) times daily. What changed:  Another medication with the same name was removed. Continue taking this medication, and follow the directions you see here.   midodrine 10 MG tablet Commonly known as:  PROAMATINE Take 1 tablet (10 mg total) by mouth 3 (three) times daily with meals. What changed:  when to take this   pantoprazole 40 MG tablet Commonly known as:  PROTONIX Take 1 tablet (40 mg total) by mouth daily.   promethazine 12.5 MG tablet Commonly known as:  PHENERGAN Take 12.5 mg  by mouth every 6 (six) hours as needed for nausea/vomiting.   senna 8.6 MG Tabs tablet Commonly known as:  SENOKOT Take 2 tablets by mouth 2 (two) times daily.   sodium chloride 1 g tablet Take 1 tablet (1 g total) by mouth 2 (two) times daily with a meal.   spironolactone 25 MG tablet Commonly known as:  ALDACTONE Take 1  tablet (25 mg total) by mouth daily.   torsemide 20 MG tablet Commonly known as:  DEMADEX Take 2 tablets (40 mg total) by mouth daily.   XIFAXAN 550 MG Tabs tablet Generic drug:  rifaximin Take 550 mg by mouth 2 (two) times daily.        DISCHARGE INSTRUCTIONS:   1. PCP f/u in 1-2 weeks 2. Nephrology f/u in 1 week  DIET:   Cardiac diet  ACTIVITY:   Activity as tolerated  OXYGEN:   Home Oxygen: No.  Oxygen Delivery: room air  DISCHARGE LOCATION:   nursing home   If you experience worsening of your admission symptoms, develop shortness of breath, life threatening emergency, suicidal or homicidal thoughts you must seek medical attention immediately by calling 911 or calling your MD immediately  if symptoms less severe.  You Must read complete instructions/literature along with all the possible adverse reactions/side effects for all the Medicines you take and that have been prescribed to you. Take any new Medicines after you have completely understood and accpet all the possible adverse reactions/side effects.   Please note  You were cared for by a hospitalist during your hospital stay. If you have any questions about your discharge medications or the care you received while you were in the hospital after you are discharged, you can call the unit and asked to speak with the hospitalist on call if the hospitalist that took care of you is not available. Once you are discharged, your primary care physician will handle any further medical issues. Please note that NO REFILLS for any discharge medications will be authorized once you are discharged, as it is imperative that you return to your primary care physician (or establish a relationship with a primary care physician if you do not have one) for your aftercare needs so that they can reassess your need for medications and monitor your lab values.    On the day of Discharge:  VITAL SIGNS:   Blood pressure (!) 124/55, pulse  (!) 105, temperature 97.9 F (36.6 C), temperature source Oral, resp. rate (!) 24, height 5\' 6"  (1.676 m), weight (!) 145.8 kg (321 lb 6.4 oz), SpO2 95 %.  PHYSICAL EXAMINATION:   GENERAL:  57 y.o.-year-old morbidly obese patient lying in the bed with no acute distress.  EYES: Pupils equal, round, reactive to light and accommodation. No scleral icterus. Extraocular muscles intact.  HEENT: Head atraumatic, normocephalic. Oropharynx and nasopharynx clear.  NECK:  Supple, no jugular venous distention. No thyroid enlargement, no tenderness.  LUNGS: Normal breath sounds bilaterally, no wheezing, rales,rhonchi or crepitation. No use of accessory muscles of respiration.  Decreased bibasilar breath sounds CARDIOVASCULAR: S1, S2 normal. No murmurs, rubs, or gallops.  ABDOMEN: Soft, obese, nontender, nondistended. Bowel sounds present. No organomegaly or mass.  EXTREMITIES: No cyanosis, or clubbing.  2+ pedal edema-lateral lower extremity wraps present.  Change every week NEUROLOGIC: Cranial nerves II through XII are intact. Muscle strength equal in all extremities.  Generalized weakness noted. sensation intact. Gait not checked.  PSYCHIATRIC: The patient is alert and oriented x 3.  SKIN: No obvious  rash, lesion, or ulcer.      DATA REVIEW:   CBC Recent Labs  Lab 01/04/18 0356  WBC 12.6*  HGB 7.6*  HCT 22.9*  PLT 106*    Chemistries  Recent Labs  Lab 01/02/18 0432  01/06/18 0941  NA 128*   < > 132*  K 4.1   < > 3.9  CL 95*   < > 96*  CO2 25   < > 26  GLUCOSE 126*   < > 135*  BUN 30*   < > 28*  CREATININE 1.09   < > 0.75  CALCIUM 8.5*   < > 8.8*  AST 72*  --   --   ALT 65*  --   --   ALKPHOS 92  --   --   BILITOT 6.7*  --   --    < > = values in this interval not displayed.     Microbiology Results  Results for orders placed or performed during the hospital encounter of 12/26/17  Urine culture     Status: Abnormal   Collection Time: 12/29/17 11:55 AM  Result Value Ref  Range Status   Specimen Description   Final    URINE, CLEAN CATCH Performed at [facility], [address], [place], [place] [address]    Special Requests   Final    NONE Performed at [facility], [address] [place], [place] [address]    Culture MULTIPLE SPECIES PRESENT, SUGGEST RECOLLECTION (A)  Final   Report Status 12/30/2017 FINAL  Final    RADIOLOGY:  No results found.   Management plans discussed with the patient, family and they are in agreement.  CODE STATUS:     Code Status Orders  (From admission, onward)        Start     Ordered   01/01/18 0404  Full code  Continuous     01/01/18 0404    Code Status History    Date Active Date Inactive Code Status Order ID Comments User Context   12/29/2017 0903 01/01/2018 0403 DNR [id number]  Katha Hamming, MD Inpatient   12/26/2017 1801 12/29/2017 0903 DNR [id number]  Houston Siren, MD Inpatient   12/17/2017 1407 12/21/2017 1943 DNR [id number]  Morton Stall, NP Inpatient   12/14/2017 1420 12/17/2017 1407 Full Code [id number]  Adrian Saran, MD Inpatient      TOTAL TIME TAKING CARE OF THIS PATIENT: 38  minutes.    Enid Baas M.D on 01/06/2018 at 11:20 AM  Between 7am to 6pm - Pager - [phone]  After 6pm go to [email or web address] - Social research officer, government  [employer] [facility]  Office  [phone]  CC: Primary care physician; Dimple Casey, MD   Note: This dictation was prepared with Dragon dictation along with smaller phrase technology. Any transcriptional errors that result from this process are unintentional.

## 2018-01-06 NOTE — Clinical Social Work Note (Signed)
Patient is medically stable for [facility] today. CSW notified patient of discharge and transport today. Patient is in agreement. CSW notified Electrical engineerCaroline liaison at [facility] [place]. RN to call report and call for EMS transport.   Ruthe Mannanandace Norie Latendresse MSW, [address] [phone]

## 2018-01-29 ENCOUNTER — Inpatient Hospital Stay
Admission: AD | Admit: 2018-01-29 | Discharge: 2018-03-06 | Disposition: E | Payer: Self-pay | Source: Ambulatory Visit | Attending: Internal Medicine | Admitting: Internal Medicine

## 2018-01-29 DIAGNOSIS — Z992 Dependence on renal dialysis: Secondary | ICD-10-CM

## 2018-01-29 DIAGNOSIS — Z4659 Encounter for fitting and adjustment of other gastrointestinal appliance and device: Secondary | ICD-10-CM

## 2018-01-29 DIAGNOSIS — R52 Pain, unspecified: Secondary | ICD-10-CM

## 2018-01-29 DIAGNOSIS — J90 Pleural effusion, not elsewhere classified: Secondary | ICD-10-CM

## 2018-01-29 LAB — CBC WITH DIFFERENTIAL/PLATELET
ABS IMMATURE GRANULOCYTES: 0.3 10*3/uL — AB (ref 0.0–0.1)
BASOS ABS: 0 10*3/uL (ref 0.0–0.1)
Basophils Relative: 1 %
EOS ABS: 0.3 10*3/uL (ref 0.0–0.7)
EOS PCT: 3 %
HCT: 24 % — ABNORMAL LOW (ref 39.0–52.0)
Hemoglobin: 7.9 g/dL — ABNORMAL LOW (ref 13.0–17.0)
Immature Granulocytes: 4 %
Lymphocytes Relative: 15 %
Lymphs Abs: 1.2 10*3/uL (ref 0.7–4.0)
MCH: 32.1 pg (ref 26.0–34.0)
MCHC: 32.9 g/dL (ref 30.0–36.0)
MCV: 97.6 fL (ref 78.0–100.0)
MONO ABS: 1.1 10*3/uL — AB (ref 0.1–1.0)
MONOS PCT: 14 %
NEUTROS ABS: 4.9 10*3/uL (ref 1.7–7.7)
Neutrophils Relative %: 63 %
PLATELETS: 122 10*3/uL — AB (ref 150–400)
RBC: 2.46 MIL/uL — ABNORMAL LOW (ref 4.22–5.81)
RDW: 19.4 % — ABNORMAL HIGH (ref 11.5–15.5)
WBC: 7.7 10*3/uL (ref 4.0–10.5)

## 2018-01-29 LAB — COMPREHENSIVE METABOLIC PANEL
ALK PHOS: 159 U/L — AB (ref 38–126)
ALT: 39 U/L (ref 0–44)
ANION GAP: 8 (ref 5–15)
AST: 71 U/L — AB (ref 15–41)
Albumin: 2.4 g/dL — ABNORMAL LOW (ref 3.5–5.0)
BILIRUBIN TOTAL: 5.5 mg/dL — AB (ref 0.3–1.2)
BUN: 7 mg/dL (ref 6–20)
CALCIUM: 8.5 mg/dL — AB (ref 8.9–10.3)
CO2: 29 mmol/L (ref 22–32)
CREATININE: 0.94 mg/dL (ref 0.61–1.24)
Chloride: 89 mmol/L — ABNORMAL LOW (ref 98–111)
GFR calc Af Amer: >60 mL/min (ref >60)
GFR calc non Af Amer: >60 mL/min (ref >60)
Glucose, Bld: 157 mg/dL — ABNORMAL HIGH (ref 70–99)
Potassium: 3.5 mmol/L (ref 3.5–5.1)
Sodium: 126 mmol/L — ABNORMAL LOW (ref 135–145)
TOTAL PROTEIN: 5.6 g/dL — AB (ref 6.5–8.1)

## 2018-01-29 LAB — AMMONIA: Ammonia: 103 umol/L — ABNORMAL HIGH (ref 9–35)

## 2018-01-29 LAB — PROTIME-INR
INR: 3.19
Prothrombin Time: 32.4 seconds — ABNORMAL HIGH (ref 11.4–15.2)

## 2018-01-29 LAB — TROPONIN I

## 2018-01-30 LAB — CBC WITH DIFFERENTIAL/PLATELET
Basophils Absolute: 0.1 10*3/uL (ref 0.0–0.1)
Basophils Relative: 1 %
EOS ABS: 0.2 10*3/uL (ref 0.0–0.7)
Eosinophils Relative: 3 %
HCT: 22.9 % — ABNORMAL LOW (ref 39.0–52.0)
HEMOGLOBIN: 7.7 g/dL — AB (ref 13.0–17.0)
LYMPHS ABS: 1.1 10*3/uL (ref 0.7–4.0)
Lymphocytes Relative: 15 %
MCH: 32.2 pg (ref 26.0–34.0)
MCHC: 33.6 g/dL (ref 30.0–36.0)
MCV: 95.8 fL (ref 78.0–100.0)
MONO ABS: 1.2 10*3/uL — AB (ref 0.1–1.0)
Monocytes Relative: 17 %
NEUTROS PCT: 64 %
Neutro Abs: 4.5 10*3/uL (ref 1.7–7.7)
PLATELETS: 95 10*3/uL — AB (ref 150–400)
RBC: 2.39 MIL/uL — ABNORMAL LOW (ref 4.22–5.81)
RDW: 19.1 % — AB (ref 11.5–15.5)
WBC: 7.1 10*3/uL (ref 4.0–10.5)

## 2018-01-30 LAB — PTH, INTACT AND CALCIUM
CALCIUM TOTAL (PTH): 8.4 mg/dL — AB (ref 8.7–10.2)
PTH: 14 pg/mL — ABNORMAL LOW (ref 15–65)

## 2018-01-31 LAB — BASIC METABOLIC PANEL
ANION GAP: 7 (ref 5–15)
BUN: 8 mg/dL (ref 6–20)
CALCIUM: 8.5 mg/dL — AB (ref 8.9–10.3)
CHLORIDE: 92 mmol/L — AB (ref 98–111)
CO2: 28 mmol/L (ref 22–32)
Creatinine, Ser: 0.81 mg/dL (ref 0.61–1.24)
GFR calc non Af Amer: >60 mL/min (ref >60)
GLUCOSE: 121 mg/dL — AB (ref 70–99)
Potassium: 3.5 mmol/L (ref 3.5–5.1)
Sodium: 127 mmol/L — ABNORMAL LOW (ref 135–145)

## 2018-01-31 LAB — CBC WITH DIFFERENTIAL/PLATELET
BASOS ABS: 0 10*3/uL (ref 0.0–0.1)
Basophils Relative: 0 %
EOS ABS: 0.2 10*3/uL (ref 0.0–0.7)
Eosinophils Relative: 3 %
HCT: 22.8 % — ABNORMAL LOW (ref 39.0–52.0)
Hemoglobin: 7.6 g/dL — ABNORMAL LOW (ref 13.0–17.0)
LYMPHS ABS: 1.3 10*3/uL (ref 0.7–4.0)
Lymphocytes Relative: 18 %
MCH: 32.2 pg (ref 26.0–34.0)
MCHC: 33.3 g/dL (ref 30.0–36.0)
MCV: 96.6 fL (ref 78.0–100.0)
MONO ABS: 1.2 10*3/uL — AB (ref 0.1–1.0)
Monocytes Relative: 16 %
NEUTROS ABS: 4.6 10*3/uL (ref 1.7–7.7)
Neutrophils Relative %: 63 %
PLATELETS: 107 10*3/uL — AB (ref 150–400)
RBC: 2.36 MIL/uL — ABNORMAL LOW (ref 4.22–5.81)
RDW: 19.7 % — ABNORMAL HIGH (ref 11.5–15.5)
WBC: 7.3 10*3/uL (ref 4.0–10.5)

## 2018-01-31 LAB — PROTIME-INR
INR: 3.18
Prothrombin Time: 32.3 seconds — ABNORMAL HIGH (ref 11.4–15.2)

## 2018-02-01 LAB — CBC WITH DIFFERENTIAL/PLATELET
BAND NEUTROPHILS: 0 %
BLASTS: 0 %
Basophils Absolute: 0 10*3/uL (ref 0.0–0.1)
Basophils Relative: 0 %
EOS ABS: 0.3 10*3/uL (ref 0.0–0.7)
Eosinophils Relative: 4 %
HCT: 21.6 % — ABNORMAL LOW (ref 39.0–52.0)
HEMOGLOBIN: 7.1 g/dL — AB (ref 13.0–17.0)
LYMPHS PCT: 16 %
Lymphs Abs: 1 10*3/uL (ref 0.7–4.0)
MCH: 31.6 pg (ref 26.0–34.0)
MCHC: 32.9 g/dL (ref 30.0–36.0)
MCV: 96 fL (ref 78.0–100.0)
Metamyelocytes Relative: 0 %
Monocytes Absolute: 0.3 10*3/uL (ref 0.1–1.0)
Monocytes Relative: 5 %
Myelocytes: 0 %
Neutro Abs: 4.9 10*3/uL (ref 1.7–7.7)
Neutrophils Relative %: 75 %
OTHER: 0 %
PROMYELOCYTES RELATIVE: 0 %
Platelets: 100 10*3/uL — ABNORMAL LOW (ref 150–400)
RBC: 2.25 MIL/uL — ABNORMAL LOW (ref 4.22–5.81)
RDW: 19.8 % — ABNORMAL HIGH (ref 11.5–15.5)
WBC: 6.5 10*3/uL (ref 4.0–10.5)
nRBC: 0 /100 WBC

## 2018-02-01 LAB — PROTIME-INR
INR: 3.22
PROTHROMBIN TIME: 32.6 s — AB (ref 11.4–15.2)

## 2018-02-02 LAB — PROTIME-INR
INR: 3.09
Prothrombin Time: 31.6 seconds — ABNORMAL HIGH (ref 11.4–15.2)

## 2018-02-02 LAB — BASIC METABOLIC PANEL
ANION GAP: 7 (ref 5–15)
BUN: 10 mg/dL (ref 6–20)
CHLORIDE: 95 mmol/L — AB (ref 98–111)
CO2: 26 mmol/L (ref 22–32)
Calcium: 8.3 mg/dL — ABNORMAL LOW (ref 8.9–10.3)
Creatinine, Ser: 0.74 mg/dL (ref 0.61–1.24)
GFR calc Af Amer: >60 mL/min (ref >60)
GLUCOSE: 123 mg/dL — AB (ref 70–99)
POTASSIUM: 3.9 mmol/L (ref 3.5–5.1)
Sodium: 128 mmol/L — ABNORMAL LOW (ref 135–145)

## 2018-02-03 LAB — PROTIME-INR
INR: 2.99
PROTHROMBIN TIME: 30.8 s — AB (ref 11.4–15.2)

## 2018-02-03 LAB — HEPATIC FUNCTION PANEL
ALK PHOS: 175 U/L — AB (ref 38–126)
ALT: 38 U/L (ref 0–44)
AST: 87 U/L — AB (ref 15–41)
Albumin: 2 g/dL — ABNORMAL LOW (ref 3.5–5.0)
BILIRUBIN TOTAL: 5.3 mg/dL — AB (ref 0.3–1.2)
Bilirubin, Direct: 2 mg/dL — ABNORMAL HIGH (ref 0.0–0.2)
Indirect Bilirubin: 3.3 mg/dL — ABNORMAL HIGH (ref 0.3–0.9)
Total Protein: 5.2 g/dL — ABNORMAL LOW (ref 6.5–8.1)

## 2018-02-03 LAB — AMMONIA: Ammonia: 56 umol/L — ABNORMAL HIGH (ref 9–35)

## 2018-02-03 DEATH — deceased

## 2018-02-04 LAB — CBC
HCT: 19.8 % — ABNORMAL LOW (ref 39.0–52.0)
Hemoglobin: 6.7 g/dL — CL (ref 13.0–17.0)
MCH: 32.8 pg (ref 26.0–34.0)
MCHC: 33.8 g/dL (ref 30.0–36.0)
MCV: 97.1 fL (ref 78.0–100.0)
PLATELETS: 113 10*3/uL — AB (ref 150–400)
RBC: 2.04 MIL/uL — ABNORMAL LOW (ref 4.22–5.81)
RDW: 20.5 % — AB (ref 11.5–15.5)
WBC: 7.8 10*3/uL (ref 4.0–10.5)

## 2018-02-04 LAB — COMPREHENSIVE METABOLIC PANEL
ALT: 35 U/L (ref 0–44)
AST: 94 U/L — AB (ref 15–41)
Albumin: 1.8 g/dL — ABNORMAL LOW (ref 3.5–5.0)
Alkaline Phosphatase: 166 U/L — ABNORMAL HIGH (ref 38–126)
Anion gap: 9 (ref 5–15)
BILIRUBIN TOTAL: 4.6 mg/dL — AB (ref 0.3–1.2)
BUN: 14 mg/dL (ref 6–20)
CALCIUM: 8.1 mg/dL — AB (ref 8.9–10.3)
CO2: 22 mmol/L (ref 22–32)
Chloride: 97 mmol/L — ABNORMAL LOW (ref 98–111)
Creatinine, Ser: 0.69 mg/dL (ref 0.61–1.24)
GFR calc Af Amer: >60 mL/min (ref >60)
Glucose, Bld: 138 mg/dL — ABNORMAL HIGH (ref 70–99)
POTASSIUM: 3.7 mmol/L (ref 3.5–5.1)
Sodium: 128 mmol/L — ABNORMAL LOW (ref 135–145)
TOTAL PROTEIN: 4.8 g/dL — AB (ref 6.5–8.1)

## 2018-02-04 LAB — PROTIME-INR
INR: 3.23
PROTHROMBIN TIME: 32.7 s — AB (ref 11.4–15.2)

## 2018-02-04 LAB — ABO/RH: ABO/RH(D): O POS

## 2018-02-04 LAB — MAGNESIUM: MAGNESIUM: 1.7 mg/dL (ref 1.7–2.4)

## 2018-02-04 LAB — PREPARE RBC (CROSSMATCH)

## 2018-02-05 LAB — BASIC METABOLIC PANEL
Anion gap: 6 (ref 5–15)
BUN: 16 mg/dL (ref 6–20)
CO2: 26 mmol/L (ref 22–32)
Calcium: 8 mg/dL — ABNORMAL LOW (ref 8.9–10.3)
Chloride: 95 mmol/L — ABNORMAL LOW (ref 98–111)
Creatinine, Ser: 0.82 mg/dL (ref 0.61–1.24)
GFR calc Af Amer: >60 mL/min (ref >60)
GFR calc non Af Amer: >60 mL/min (ref >60)
GLUCOSE: 134 mg/dL — AB (ref 70–99)
Potassium: 3.7 mmol/L (ref 3.5–5.1)
Sodium: 127 mmol/L — ABNORMAL LOW (ref 135–145)

## 2018-02-05 LAB — BPAM RBC
Blood Product Expiration Date: 201908042359
ISSUE DATE / TIME: 201907021111
UNIT TYPE AND RH: 5100

## 2018-02-05 LAB — PROTIME-INR
INR: 3.05
Prothrombin Time: 31.3 seconds — ABNORMAL HIGH (ref 11.4–15.2)

## 2018-02-05 LAB — MAGNESIUM: Magnesium: 2.1 mg/dL (ref 1.7–2.4)

## 2018-02-05 LAB — TYPE AND SCREEN
ABO/RH(D): O POS
ANTIBODY SCREEN: NEGATIVE
Unit division: 0

## 2018-02-05 LAB — CBC
HEMATOCRIT: 21.6 % — AB (ref 39.0–52.0)
Hemoglobin: 7.3 g/dL — ABNORMAL LOW (ref 13.0–17.0)
MCH: 32 pg (ref 26.0–34.0)
MCHC: 33.8 g/dL (ref 30.0–36.0)
MCV: 94.7 fL (ref 78.0–100.0)
Platelets: 101 10*3/uL — ABNORMAL LOW (ref 150–400)
RBC: 2.28 MIL/uL — AB (ref 4.22–5.81)
RDW: 20.4 % — ABNORMAL HIGH (ref 11.5–15.5)
WBC: 7.2 10*3/uL (ref 4.0–10.5)

## 2018-02-06 LAB — PROTIME-INR
INR: 2.99
PROTHROMBIN TIME: 30.8 s — AB (ref 11.4–15.2)

## 2018-02-06 LAB — BASIC METABOLIC PANEL
ANION GAP: 7 (ref 5–15)
BUN: 17 mg/dL (ref 6–20)
CHLORIDE: 95 mmol/L — AB (ref 98–111)
CO2: 25 mmol/L (ref 22–32)
Calcium: 8.2 mg/dL — ABNORMAL LOW (ref 8.9–10.3)
Creatinine, Ser: 0.89 mg/dL (ref 0.61–1.24)
GFR calc Af Amer: >60 mL/min (ref >60)
GFR calc non Af Amer: >60 mL/min (ref >60)
GLUCOSE: 111 mg/dL — AB (ref 70–99)
POTASSIUM: 3.7 mmol/L (ref 3.5–5.1)
SODIUM: 127 mmol/L — AB (ref 135–145)

## 2018-02-06 LAB — AMMONIA: AMMONIA: 67 umol/L — AB (ref 9–35)

## 2018-02-07 LAB — OCCULT BLOOD X 1 CARD TO LAB, STOOL: Fecal Occult Bld: POSITIVE — AB

## 2018-02-07 LAB — PROTIME-INR
INR: 2.96
Prothrombin Time: 30.6 seconds — ABNORMAL HIGH (ref 11.4–15.2)

## 2018-02-08 LAB — CBC
HCT: 23 % — ABNORMAL LOW (ref 39.0–52.0)
Hemoglobin: 7.3 g/dL — ABNORMAL LOW (ref 13.0–17.0)
MCH: 32 pg (ref 26.0–34.0)
MCHC: 31.7 g/dL (ref 30.0–36.0)
MCV: 100.9 fL — AB (ref 78.0–100.0)
PLATELETS: 115 10*3/uL — AB (ref 150–400)
RBC: 2.28 MIL/uL — ABNORMAL LOW (ref 4.22–5.81)
RDW: 21.4 % — AB (ref 11.5–15.5)
WBC: 7.6 10*3/uL (ref 4.0–10.5)

## 2018-02-08 LAB — COMPREHENSIVE METABOLIC PANEL
ALK PHOS: 187 U/L — AB (ref 38–126)
ALT: 48 U/L — AB (ref 0–44)
AST: 119 U/L — AB (ref 15–41)
Albumin: 2.2 g/dL — ABNORMAL LOW (ref 3.5–5.0)
Anion gap: 9 (ref 5–15)
BUN: 19 mg/dL (ref 6–20)
CALCIUM: 8.7 mg/dL — AB (ref 8.9–10.3)
CHLORIDE: 98 mmol/L (ref 98–111)
CO2: 27 mmol/L (ref 22–32)
CREATININE: 0.89 mg/dL (ref 0.61–1.24)
GFR calc Af Amer: >60 mL/min (ref >60)
GFR calc non Af Amer: >60 mL/min (ref >60)
GLUCOSE: 116 mg/dL — AB (ref 70–99)
Potassium: 3.7 mmol/L (ref 3.5–5.1)
SODIUM: 134 mmol/L — AB (ref 135–145)
Total Bilirubin: 6.3 mg/dL — ABNORMAL HIGH (ref 0.3–1.2)
Total Protein: 5.8 g/dL — ABNORMAL LOW (ref 6.5–8.1)

## 2018-02-08 LAB — MAGNESIUM: MAGNESIUM: 1.9 mg/dL (ref 1.7–2.4)

## 2018-02-08 LAB — PROTIME-INR
INR: 2.9
Prothrombin Time: 30.1 seconds — ABNORMAL HIGH (ref 11.4–15.2)

## 2018-02-08 LAB — AMMONIA: Ammonia: 50 umol/L — ABNORMAL HIGH (ref 9–35)

## 2018-02-09 ENCOUNTER — Other Ambulatory Visit (HOSPITAL_COMMUNITY): Payer: Self-pay

## 2018-02-09 LAB — PROTIME-INR
INR: 3.05
Prothrombin Time: 31.3 seconds — ABNORMAL HIGH (ref 11.4–15.2)

## 2018-02-09 LAB — AMMONIA: AMMONIA: 77 umol/L — AB (ref 9–35)

## 2018-02-10 LAB — BASIC METABOLIC PANEL
Anion gap: 6 (ref 5–15)
BUN: 24 mg/dL — AB (ref 6–20)
CO2: 28 mmol/L (ref 22–32)
CREATININE: 1.1 mg/dL (ref 0.61–1.24)
Calcium: 8.6 mg/dL — ABNORMAL LOW (ref 8.9–10.3)
Chloride: 101 mmol/L (ref 98–111)
GFR calc Af Amer: >60 mL/min (ref >60)
GFR calc non Af Amer: >60 mL/min (ref >60)
Glucose, Bld: 116 mg/dL — ABNORMAL HIGH (ref 70–99)
Potassium: 3.7 mmol/L (ref 3.5–5.1)
SODIUM: 135 mmol/L (ref 135–145)

## 2018-02-10 LAB — AMMONIA: Ammonia: 43 umol/L — ABNORMAL HIGH (ref 9–35)

## 2018-02-10 LAB — CBC
HEMATOCRIT: 20.9 % — AB (ref 39.0–52.0)
HEMOGLOBIN: 6.9 g/dL — AB (ref 13.0–17.0)
MCH: 32.9 pg (ref 26.0–34.0)
MCHC: 33 g/dL (ref 30.0–36.0)
MCV: 99.5 fL (ref 78.0–100.0)
Platelets: 103 10*3/uL — ABNORMAL LOW (ref 150–400)
RBC: 2.1 MIL/uL — AB (ref 4.22–5.81)
RDW: 22 % — AB (ref 11.5–15.5)
WBC: 6.1 10*3/uL (ref 4.0–10.5)

## 2018-02-10 LAB — PROTIME-INR
INR: 3.38
Prothrombin Time: 33.9 seconds — ABNORMAL HIGH (ref 11.4–15.2)

## 2018-02-10 LAB — PREPARE RBC (CROSSMATCH)

## 2018-02-11 LAB — TYPE AND SCREEN
ABO/RH(D): O POS
ANTIBODY SCREEN: NEGATIVE
Unit division: 0

## 2018-02-11 LAB — RENAL FUNCTION PANEL
ALBUMIN: 2 g/dL — AB (ref 3.5–5.0)
ANION GAP: 8 (ref 5–15)
BUN: 22 mg/dL — ABNORMAL HIGH (ref 6–20)
CALCIUM: 8.4 mg/dL — AB (ref 8.9–10.3)
CO2: 26 mmol/L (ref 22–32)
Chloride: 98 mmol/L (ref 98–111)
Creatinine, Ser: 1.26 mg/dL — ABNORMAL HIGH (ref 0.61–1.24)
Glucose, Bld: 146 mg/dL — ABNORMAL HIGH (ref 70–99)
PHOSPHORUS: 5 mg/dL — AB (ref 2.5–4.6)
Potassium: 4.1 mmol/L (ref 3.5–5.1)
Sodium: 132 mmol/L — ABNORMAL LOW (ref 135–145)

## 2018-02-11 LAB — CBC
HCT: 21.9 % — ABNORMAL LOW (ref 39.0–52.0)
HEMOGLOBIN: 7.2 g/dL — AB (ref 13.0–17.0)
MCH: 32.6 pg (ref 26.0–34.0)
MCHC: 32.9 g/dL (ref 30.0–36.0)
MCV: 99.1 fL (ref 78.0–100.0)
Platelets: 99 10*3/uL — ABNORMAL LOW (ref 150–400)
RBC: 2.21 MIL/uL — AB (ref 4.22–5.81)
RDW: 22.7 % — ABNORMAL HIGH (ref 11.5–15.5)
WBC: 6.6 10*3/uL (ref 4.0–10.5)

## 2018-02-11 LAB — BPAM RBC
BLOOD PRODUCT EXPIRATION DATE: 201908022359
ISSUE DATE / TIME: 201907081130
UNIT TYPE AND RH: 5100

## 2018-02-11 LAB — MAGNESIUM: MAGNESIUM: 1.9 mg/dL (ref 1.7–2.4)

## 2018-02-11 LAB — PROTIME-INR
INR: 2.85
PROTHROMBIN TIME: 29.7 s — AB (ref 11.4–15.2)

## 2018-02-11 LAB — AMMONIA: Ammonia: 61 umol/L — ABNORMAL HIGH (ref 9–35)

## 2018-02-12 LAB — MAGNESIUM: MAGNESIUM: 2 mg/dL (ref 1.7–2.4)

## 2018-02-12 LAB — RENAL FUNCTION PANEL
Albumin: 2 g/dL — ABNORMAL LOW (ref 3.5–5.0)
Anion gap: 10 (ref 5–15)
BUN: 29 mg/dL — ABNORMAL HIGH (ref 6–20)
CO2: 27 mmol/L (ref 22–32)
Calcium: 8.6 mg/dL — ABNORMAL LOW (ref 8.9–10.3)
Chloride: 96 mmol/L — ABNORMAL LOW (ref 98–111)
Creatinine, Ser: 1.58 mg/dL — ABNORMAL HIGH (ref 0.61–1.24)
GFR calc Af Amer: 55 mL/min — ABNORMAL LOW
GFR calc non Af Amer: 48 mL/min — ABNORMAL LOW
Glucose, Bld: 143 mg/dL — ABNORMAL HIGH (ref 70–99)
Phosphorus: 5.2 mg/dL — ABNORMAL HIGH (ref 2.5–4.6)
Potassium: 4.3 mmol/L (ref 3.5–5.1)
Sodium: 133 mmol/L — ABNORMAL LOW (ref 135–145)

## 2018-02-12 LAB — CBC
HCT: 23.1 % — ABNORMAL LOW (ref 39.0–52.0)
Hemoglobin: 7.4 g/dL — ABNORMAL LOW (ref 13.0–17.0)
MCH: 32.2 pg (ref 26.0–34.0)
MCHC: 32 g/dL (ref 30.0–36.0)
MCV: 100.4 fL — ABNORMAL HIGH (ref 78.0–100.0)
Platelets: 111 K/uL — ABNORMAL LOW (ref 150–400)
RBC: 2.3 MIL/uL — ABNORMAL LOW (ref 4.22–5.81)
RDW: 23 % — ABNORMAL HIGH (ref 11.5–15.5)
WBC: 7.8 K/uL (ref 4.0–10.5)

## 2018-02-12 LAB — PROTIME-INR
INR: 2.93
Prothrombin Time: 30.3 seconds — ABNORMAL HIGH (ref 11.4–15.2)

## 2018-02-13 LAB — CBC
HCT: 22.3 % — ABNORMAL LOW (ref 39.0–52.0)
HEMOGLOBIN: 7.2 g/dL — AB (ref 13.0–17.0)
MCH: 32.4 pg (ref 26.0–34.0)
MCHC: 32.3 g/dL (ref 30.0–36.0)
MCV: 100.5 fL — AB (ref 78.0–100.0)
Platelets: ADEQUATE 10*3/uL (ref 150–400)
RBC: 2.22 MIL/uL — ABNORMAL LOW (ref 4.22–5.81)
RDW: 22.9 % — ABNORMAL HIGH (ref 11.5–15.5)
WBC: 8.1 10*3/uL (ref 4.0–10.5)

## 2018-02-13 LAB — RENAL FUNCTION PANEL
Albumin: 2 g/dL — ABNORMAL LOW (ref 3.5–5.0)
Anion gap: 11 (ref 5–15)
BUN: 35 mg/dL — ABNORMAL HIGH (ref 6–20)
CHLORIDE: 95 mmol/L — AB (ref 98–111)
CO2: 26 mmol/L (ref 22–32)
Calcium: 8.4 mg/dL — ABNORMAL LOW (ref 8.9–10.3)
Creatinine, Ser: 2.27 mg/dL — ABNORMAL HIGH (ref 0.61–1.24)
GFR calc non Af Amer: 31 mL/min — ABNORMAL LOW (ref >60)
GFR, EST AFRICAN AMERICAN: 36 mL/min — AB (ref >60)
GLUCOSE: 147 mg/dL — AB (ref 70–99)
Phosphorus: 6 mg/dL — ABNORMAL HIGH (ref 2.5–4.6)
Potassium: 4.2 mmol/L (ref 3.5–5.1)
Sodium: 132 mmol/L — ABNORMAL LOW (ref 135–145)

## 2018-02-13 LAB — MAGNESIUM: Magnesium: 2.2 mg/dL (ref 1.7–2.4)

## 2018-02-13 LAB — PROTIME-INR
INR: 3.22
PROTHROMBIN TIME: 32.7 s — AB (ref 11.4–15.2)

## 2018-02-14 ENCOUNTER — Other Ambulatory Visit (HOSPITAL_COMMUNITY): Payer: Self-pay

## 2018-02-14 LAB — COMPREHENSIVE METABOLIC PANEL
ALBUMIN: 1.9 g/dL — AB (ref 3.5–5.0)
ALT: 50 U/L — ABNORMAL HIGH (ref 0–44)
AST: 129 U/L — ABNORMAL HIGH (ref 15–41)
Alkaline Phosphatase: 188 U/L — ABNORMAL HIGH (ref 38–126)
Anion gap: 11 (ref 5–15)
BILIRUBIN TOTAL: 5.2 mg/dL — AB (ref 0.3–1.2)
BUN: 40 mg/dL — ABNORMAL HIGH (ref 6–20)
CALCIUM: 8.2 mg/dL — AB (ref 8.9–10.3)
CO2: 24 mmol/L (ref 22–32)
Chloride: 96 mmol/L — ABNORMAL LOW (ref 98–111)
Creatinine, Ser: 2.88 mg/dL — ABNORMAL HIGH (ref 0.61–1.24)
GFR calc Af Amer: 27 mL/min — ABNORMAL LOW (ref >60)
GFR calc non Af Amer: 23 mL/min — ABNORMAL LOW (ref >60)
GLUCOSE: 128 mg/dL — AB (ref 70–99)
POTASSIUM: 4.4 mmol/L (ref 3.5–5.1)
Sodium: 131 mmol/L — ABNORMAL LOW (ref 135–145)
TOTAL PROTEIN: 5.7 g/dL — AB (ref 6.5–8.1)

## 2018-02-14 LAB — CBC
HEMATOCRIT: 21.9 % — AB (ref 39.0–52.0)
Hemoglobin: 7.1 g/dL — ABNORMAL LOW (ref 13.0–17.0)
MCH: 32.6 pg (ref 26.0–34.0)
MCHC: 32.4 g/dL (ref 30.0–36.0)
MCV: 100.5 fL — ABNORMAL HIGH (ref 78.0–100.0)
Platelets: 115 10*3/uL — ABNORMAL LOW (ref 150–400)
RBC: 2.18 MIL/uL — ABNORMAL LOW (ref 4.22–5.81)
RDW: 22.8 % — AB (ref 11.5–15.5)
WBC: 7.4 10*3/uL (ref 4.0–10.5)

## 2018-02-14 LAB — AMMONIA: Ammonia: 64 umol/L — ABNORMAL HIGH (ref 9–35)

## 2018-02-14 LAB — PHOSPHORUS: PHOSPHORUS: 6.4 mg/dL — AB (ref 2.5–4.6)

## 2018-02-14 LAB — MAGNESIUM: Magnesium: 2.1 mg/dL (ref 1.7–2.4)

## 2018-02-14 LAB — PROTIME-INR
INR: 3.18
PROTHROMBIN TIME: 32.3 s — AB (ref 11.4–15.2)

## 2018-02-14 LAB — PREPARE RBC (CROSSMATCH)

## 2018-02-14 NOTE — Progress Notes (Signed)
[facility]  ROUNDING NOTE   Subjective:  Patient known to us from prior admission at [facility] in June. After leaving [facility] he went to a rehabilitation facility in [place]. He went to another acute care facility from there. He was found to have severe osteomyelitis and was felt to be a poor surgical candidate. Therefore is been treated with antibiotics. Severe lower extremity edema persists.   Currently on Lasix drip now with deteriorating renal function.   Objective:  Vital signs in last 24 hours:  Temperature 98.1 pulse 72 respirations 18 and blood pressure 96/70  Physical Exam: General: No acute distress  Head: Normocephalic, atraumatic. Moist oral mucosal membranes  Eyes: Icteric  Neck: Supple, trachea midline  Lungs:  Clear to auscultation, normal effort  Heart: S1S2 no rubs  Abdomen:  Soft, nontender, bowel sounds present  Extremities: 2+ peripheral edema.  Neurologic: Awake, alert, following commands  Skin: Jaundice noted, cream on bilateral lower extremeties       Basic Metabolic Panel: Recent Labs  Lab 02/08/18 0514 02/10/18 0545 02/11/18 0656 02/12/18 0619 02/13/18 0630 02/14/18 0349  NA 134* 135 132* 133* 132* 131*  K 3.7 3.7 4.1 4.3 4.2 4.4  CL 98 101 98 96* 95* 96*  CO2 27 28 26 27 26 24   GLUCOSE 116* 116* 146* 143* 147* 128*  BUN 19 24* 22* 29* 35* 40*  CREATININE 0.89 1.10 1.26* 1.58* 2.27* 2.88*  CALCIUM 8.7* 8.6* 8.4* 8.6* 8.4* 8.2*  MG 1.9  --  1.9 2.0 2.2 2.1  PHOS  --   --  5.0* 5.2* 6.0* 6.4*    Liver Function Tests: Recent Labs  Lab 02/08/18 0514 02/11/18 0656 02/12/18 0619 02/13/18 0630 02/14/18 0349  AST 119*  --   --   --  129*  ALT 48*  --   --   --  50*  ALKPHOS 187*  --   --   --  188*  BILITOT 6.3*  --   --   --  5.2*  PROT 5.8*  --   --   --  5.7*  ALBUMIN 2.2* 2.0* 2.0* 2.0* 1.9*   No results for input(s): LIPASE, AMYLASE in the last 168  hours. Recent Labs  Lab 02/10/18 0545 02/11/18 0656 02/14/18 0349  AMMONIA 43* 61* 64*    CBC: Recent Labs  Lab 02/10/18 0545 02/11/18 0656 02/12/18 0619 02/13/18 0630 02/14/18 0349  WBC 6.1 6.6 7.8 8.1 7.4  HGB 6.9* 7.2* 7.4* 7.2* 7.1*  HCT 20.9* 21.9* 23.1* 22.3* 21.9*  MCV 99.5 99.1 100.4* 100.5* 100.5*  PLT 103* 99* 111* PLATELET CLUMPS NOTED ON SMEAR, COUNT APPEARS ADEQUATE 115*    Cardiac Enzymes: No results for input(s): CKTOTAL, CKMB, CKMBINDEX, TROPONINI in the last 168 hours.  BNP: Invalid input(s): POCBNP  CBG: No results for input(s): GLUCAP in the last 168 hours.  Microbiology: Results for orders placed or performed during the hospital encounter of 12/26/17  Urine culture     Status: Abnormal   Collection Time: 12/29/17 11:55 AM  Result Value Ref Range Status   Specimen Description   Final    URINE, CLEAN CATCH Performed at [facility], [address] [place], [place] [address]    Special Requests   Final    NONE Performed at [facility], [address] [place], [place] [address]    Culture MULTIPLE SPECIES PRESENT, SUGGEST RECOLLECTION (A)  Final   Report Status 12/30/2017  FINAL  Final    Coagulation Studies: Recent Labs    02/12/18 0619 02/13/18 0630 02/14/18 0349  LABPROT 30.3* 32.7* 32.3*  INR 2.93 3.22 3.18    Urinalysis: No results for input(s): COLORURINE, LABSPEC, PHURINE, GLUCOSEU, HGBUR, BILIRUBINUR, KETONESUR, PROTEINUR, UROBILINOGEN, NITRITE, LEUKOCYTESUR in the last 72 hours.  Invalid input(s): APPERANCEUR    Imaging: Dg Chest Port 1 View  Result Date: 02/14/2018 CLINICAL DATA:  Shortness of breath. EXAM: PORTABLE CHEST 1 VIEW COMPARISON:  Radiograph of Jan 02, 2018. FINDINGS: Stable cardiomegaly with central pulmonary vascular congestion. Right internal jugular Port-A-Cath is unchanged in position. No pneumothorax or definite pleural effusion is noted. No consolidative process is noted. Bony thorax  is unremarkable. IMPRESSION: Stable cardiomegaly with central pulmonary vascular congestion. Electronically Signed   By: Lupita Raider, M.D.   On: 02/14/2018 12:13     Medications:       Assessment/ Plan:  57 y.o. 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   1.  Acute renal failure. 2.  Hyponatremia. 3.  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 4.  Anemia unspecified.   Plan: Patient well-known to [place] from prior admission at [facility]  He presented this time with osteomyelitis.  The past several days he developed severe acute renal failure with a BUN up to 40 with a creatinine of 2.88.  This may be secondary to overdiuresis.  Lasix drip has been stopped.  He also has significant anemia.  Discussed with hospitalist and we have advised blood transfusion.  No urgent indication for dialysis at the moment however if renal function continues to deteriorate we may need to consider this.  Renal ultrasound also to be ordered to make sure there is no underlying obstruction.  Further plan as patient progresses.  Thanks for consultation.    LOS: 0 Coco Sharpnack 7/12/20194:36 PM

## 2018-02-15 ENCOUNTER — Other Ambulatory Visit (HOSPITAL_COMMUNITY): Payer: Self-pay

## 2018-02-15 LAB — RENAL FUNCTION PANEL
Albumin: 2 g/dL — ABNORMAL LOW (ref 3.5–5.0)
Anion gap: 11 (ref 5–15)
BUN: 44 mg/dL — AB (ref 6–20)
CALCIUM: 8.4 mg/dL — AB (ref 8.9–10.3)
CHLORIDE: 98 mmol/L (ref 98–111)
CO2: 21 mmol/L — AB (ref 22–32)
CREATININE: 3.37 mg/dL — AB (ref 0.61–1.24)
GFR, EST AFRICAN AMERICAN: 22 mL/min — AB (ref >60)
GFR, EST NON AFRICAN AMERICAN: 19 mL/min — AB (ref >60)
Glucose, Bld: 117 mg/dL — ABNORMAL HIGH (ref 70–99)
Phosphorus: 7 mg/dL — ABNORMAL HIGH (ref 2.5–4.6)
Potassium: 4.5 mmol/L (ref 3.5–5.1)
SODIUM: 130 mmol/L — AB (ref 135–145)

## 2018-02-15 LAB — TYPE AND SCREEN
ABO/RH(D): O POS
Antibody Screen: NEGATIVE
Unit division: 0

## 2018-02-15 LAB — MAGNESIUM: MAGNESIUM: 2.1 mg/dL (ref 1.7–2.4)

## 2018-02-15 LAB — BPAM RBC
BLOOD PRODUCT EXPIRATION DATE: 201908102359
ISSUE DATE / TIME: 201907121834
UNIT TYPE AND RH: 5100

## 2018-02-15 LAB — CBC
HCT: 24.5 % — ABNORMAL LOW (ref 39.0–52.0)
Hemoglobin: 8 g/dL — ABNORMAL LOW (ref 13.0–17.0)
MCH: 32.4 pg (ref 26.0–34.0)
MCHC: 32.7 g/dL (ref 30.0–36.0)
MCV: 99.2 fL (ref 78.0–100.0)
PLATELETS: 116 10*3/uL — AB (ref 150–400)
RBC: 2.47 MIL/uL — AB (ref 4.22–5.81)
RDW: 23.1 % — AB (ref 11.5–15.5)
WBC: 7.9 10*3/uL (ref 4.0–10.5)

## 2018-02-15 LAB — PROTIME-INR
INR: 3.04
Prothrombin Time: 31.2 seconds — ABNORMAL HIGH (ref 11.4–15.2)

## 2018-02-16 LAB — RENAL FUNCTION PANEL
Albumin: 2.2 g/dL — ABNORMAL LOW (ref 3.5–5.0)
Anion gap: 12 (ref 5–15)
BUN: 48 mg/dL — ABNORMAL HIGH (ref 6–20)
CALCIUM: 8.6 mg/dL — AB (ref 8.9–10.3)
CO2: 21 mmol/L — AB (ref 22–32)
CREATININE: 3.85 mg/dL — AB (ref 0.61–1.24)
Chloride: 99 mmol/L (ref 98–111)
GFR calc Af Amer: 19 mL/min — ABNORMAL LOW (ref >60)
GFR calc non Af Amer: 16 mL/min — ABNORMAL LOW (ref >60)
GLUCOSE: 137 mg/dL — AB (ref 70–99)
Phosphorus: 7.4 mg/dL — ABNORMAL HIGH (ref 2.5–4.6)
Potassium: 4.4 mmol/L (ref 3.5–5.1)
Sodium: 132 mmol/L — ABNORMAL LOW (ref 135–145)

## 2018-02-16 LAB — CBC
HCT: 24.3 % — ABNORMAL LOW (ref 39.0–52.0)
Hemoglobin: 7.8 g/dL — ABNORMAL LOW (ref 13.0–17.0)
MCH: 31.8 pg (ref 26.0–34.0)
MCHC: 32.1 g/dL (ref 30.0–36.0)
MCV: 99.2 fL (ref 78.0–100.0)
PLATELETS: 109 10*3/uL — AB (ref 150–400)
RBC: 2.45 MIL/uL — ABNORMAL LOW (ref 4.22–5.81)
RDW: 22.8 % — AB (ref 11.5–15.5)
WBC: 7.1 10*3/uL (ref 4.0–10.5)

## 2018-02-16 LAB — PROTIME-INR
INR: 3.01
Prothrombin Time: 30.9 seconds — ABNORMAL HIGH (ref 11.4–15.2)

## 2018-02-16 LAB — AMMONIA: Ammonia: 73 umol/L — ABNORMAL HIGH (ref 9–35)

## 2018-02-16 LAB — MAGNESIUM: Magnesium: 2.1 mg/dL (ref 1.7–2.4)

## 2018-02-17 ENCOUNTER — Other Ambulatory Visit (HOSPITAL_COMMUNITY): Payer: Self-pay

## 2018-02-17 ENCOUNTER — Encounter (HOSPITAL_COMMUNITY): Payer: Self-pay | Admitting: Diagnostic Radiology

## 2018-02-17 HISTORY — PX: IR US GUIDE VASC ACCESS LEFT: IMG2389

## 2018-02-17 HISTORY — PX: IR FLUORO GUIDE CV LINE LEFT: IMG2282

## 2018-02-17 LAB — CBC
HEMATOCRIT: 23 % — AB (ref 39.0–52.0)
Hemoglobin: 7.6 g/dL — ABNORMAL LOW (ref 13.0–17.0)
MCH: 32.3 pg (ref 26.0–34.0)
MCHC: 33 g/dL (ref 30.0–36.0)
MCV: 97.9 fL (ref 78.0–100.0)
Platelets: 110 10*3/uL — ABNORMAL LOW (ref 150–400)
RBC: 2.35 MIL/uL — AB (ref 4.22–5.81)
RDW: 23.2 % — ABNORMAL HIGH (ref 11.5–15.5)
WBC: 8 10*3/uL (ref 4.0–10.5)

## 2018-02-17 LAB — RENAL FUNCTION PANEL
ANION GAP: 14 (ref 5–15)
ANION GAP: 13 (ref 5–15)
Albumin: 2.5 g/dL — ABNORMAL LOW (ref 3.5–5.0)
Albumin: 2.5 g/dL — ABNORMAL LOW (ref 3.5–5.0)
BUN: 51 mg/dL — ABNORMAL HIGH (ref 6–20)
BUN: 51 mg/dL — ABNORMAL HIGH (ref 6–20)
CHLORIDE: 99 mmol/L (ref 98–111)
CHLORIDE: 99 mmol/L (ref 98–111)
CO2: 20 mmol/L — AB (ref 22–32)
CO2: 20 mmol/L — AB (ref 22–32)
CREATININE: 4.36 mg/dL — AB (ref 0.61–1.24)
CREATININE: 4.49 mg/dL — AB (ref 0.61–1.24)
Calcium: 8.7 mg/dL — ABNORMAL LOW (ref 8.9–10.3)
Calcium: 8.6 mg/dL — ABNORMAL LOW (ref 8.9–10.3)
GFR calc non Af Amer: 14 mL/min — ABNORMAL LOW (ref >60)
GFR calc non Af Amer: 13 mL/min — ABNORMAL LOW (ref >60)
GFR, EST AFRICAN AMERICAN: 16 mL/min — AB (ref >60)
GFR, EST AFRICAN AMERICAN: 16 mL/min — AB (ref >60)
GLUCOSE: 122 mg/dL — AB (ref 70–99)
Glucose, Bld: 118 mg/dL — ABNORMAL HIGH (ref 70–99)
POTASSIUM: 4.6 mmol/L (ref 3.5–5.1)
Phosphorus: 7.6 mg/dL — ABNORMAL HIGH (ref 2.5–4.6)
Phosphorus: 7.4 mg/dL — ABNORMAL HIGH (ref 2.5–4.6)
Potassium: 4.5 mmol/L (ref 3.5–5.1)
Sodium: 133 mmol/L — ABNORMAL LOW (ref 135–145)
Sodium: 132 mmol/L — ABNORMAL LOW (ref 135–145)

## 2018-02-17 LAB — PROTIME-INR
INR: 3.01
Prothrombin Time: 31 seconds — ABNORMAL HIGH (ref 11.4–15.2)

## 2018-02-17 LAB — MAGNESIUM: Magnesium: 2.3 mg/dL (ref 1.7–2.4)

## 2018-02-17 LAB — AMMONIA: AMMONIA: 70 umol/L — AB (ref 9–35)

## 2018-02-17 MED ORDER — LIDOCAINE HCL 1 % IJ SOLN
INTRAMUSCULAR | Status: AC
  Filled 2018-02-17: qty 20

## 2018-02-17 MED ORDER — HEPARIN SODIUM (PORCINE) 1000 UNIT/ML IJ SOLN
INTRAMUSCULAR | Status: AC
  Filled 2018-02-17: qty 1

## 2018-02-17 MED ORDER — HEPARIN SODIUM (PORCINE) 1000 UNIT/ML IJ SOLN: INTRAMUSCULAR | Status: DC | PRN

## 2018-02-17 MED ORDER — HEPARIN SODIUM (PORCINE) 1000 UNIT/ML IJ SOLN
INTRAMUSCULAR | Status: DC | PRN
  Administered 2018-02-17: 2800 [IU] via INTRAVENOUS

## 2018-02-17 MED ORDER — LIDOCAINE HCL 1 % IJ SOLN
INTRAMUSCULAR | Status: DC | PRN
  Administered 2018-02-17: 2 mL

## 2018-02-17 NOTE — Progress Notes (Signed)
Patient ID: Antonio York, male   DOB: 03/20/1961, 57 y.o.   MRN: [id number]   I have spoken to pts wife Antonio York regarding temporary dialysis catheter placement. She is aware of procedure benefits and risks including but not limited to 'Infection; bleeding; vessel damage She is agreeable to proceed Consent signed and in chart

## 2018-02-17 NOTE — Procedures (Signed)
Placement of left jugular non-tunneled dialysis catheter. Tip at SVC/RA junction.  Minimal blood loss and no immediate complication.

## 2018-02-17 NOTE — Progress Notes (Signed)
[facility]  ROUNDING NOTE   Subjective:  Patient worse over the weekend. Creatinine up to 4.4. Interventional radiology consultation requested to place temporary dialysis catheter.   Objective:  Vital signs in last 24 hours:  Temperature 97.2 pulse 77 respirations 24 blood pressure 92/49  Physical Exam: General: No acute distress  Head: Normocephalic, atraumatic. Moist oral mucosal membranes  Eyes: Icteric  Neck: Supple, trachea midline  Lungs:  Clear to auscultation, normal effort  Heart: S1S2 no rubs  Abdomen:  Soft, nontender, bowel sounds present  Extremities: 3+ peripheral edema.  Neurologic: Awake, alert, following commands  Skin: Jaundice noted, cream on bilateral lower extremeties       Basic Metabolic Panel: Recent Labs  Lab 02/13/18 0630 02/14/18 0349 02/15/18 0629 02/16/18 0645 02/17/18 0710  NA 132* 131* 130* 132* 133*  K 4.2 4.4 4.5 4.4 4.6  CL 95* 96* 98 99 99  CO2 26 24 21* 21* 20*  GLUCOSE 147* 128* 117* 137* 118*  BUN 35* 40* 44* 48* 51*  CREATININE 2.27* 2.88* 3.37* 3.85* 4.36*  CALCIUM 8.4* 8.2* 8.4* 8.6* 8.7*  MG 2.2 2.1 2.1 2.1 2.3  PHOS 6.0* 6.4* 7.0* 7.4* 7.6*    Liver Function Tests: Recent Labs  Lab 02/13/18 0630 02/14/18 0349 02/15/18 0629 02/16/18 0645 02/17/18 0710  AST  --  129*  --   --   --   ALT  --  50*  --   --   --   ALKPHOS  --  188*  --   --   --   BILITOT  --  5.2*  --   --   --   PROT  --  5.7*  --   --   --   ALBUMIN 2.0* 1.9* 2.0* 2.2* 2.5*   No results for input(s): LIPASE, AMYLASE in the last 168 hours. Recent Labs  Lab 02/14/18 0349 02/16/18 0645 02/17/18 0710  AMMONIA 64* 73* 70*    CBC: Recent Labs  Lab 02/13/18 0630 02/14/18 0349 02/15/18 0629 02/16/18 0645 02/17/18 0710  WBC 8.1 7.4 7.9 7.1 8.0  HGB 7.2* 7.1* 8.0* 7.8* 7.6*  HCT 22.3* 21.9* 24.5* 24.3* 23.0*  MCV 100.5* 100.5* 99.2 99.2 97.9  PLT PLATELET CLUMPS NOTED ON SMEAR, COUNT APPEARS ADEQUATE 115* 116* 109* 110*     Cardiac Enzymes: No results for input(s): CKTOTAL, CKMB, CKMBINDEX, TROPONINI in the last 168 hours.  BNP: Invalid input(s): POCBNP  CBG: No results for input(s): GLUCAP in the last 168 hours.  Microbiology: Results for orders placed or performed during the hospital encounter of 12/26/17  Urine culture     Status: Abnormal   Collection Time: 12/29/17 11:55 AM  Result Value Ref Range Status   Specimen Description   Final    URINE, CLEAN CATCH Performed at [facility], [address] [place], [place] [address]    Special Requests   Final    NONE Performed at [facility], [address] [place], [place] [address]    Culture MULTIPLE SPECIES PRESENT, SUGGEST RECOLLECTION (A)  Final   Report Status 12/30/2017 FINAL  Final    Coagulation Studies: Recent Labs    02/15/18 0629 02/16/18 0645 02/17/18 0710  LABPROT 31.2* 30.9* 31.0*  INR 3.04 3.01 3.01    Urinalysis: No results for input(s): COLORURINE, LABSPEC, PHURINE, GLUCOSEU, HGBUR, BILIRUBINUR, KETONESUR, PROTEINUR, UROBILINOGEN, NITRITE, LEUKOCYTESUR in the last 72 hours.  Invalid input(s): APPERANCEUR    Imaging: Ct Abdomen Pelvis Wo Contrast  Result Date: 02/15/2018 CLINICAL DATA:  Hepatic failure. EXAM: CT ABDOMEN AND PELVIS WITHOUT CONTRAST TECHNIQUE: Multidetector CT imaging of the abdomen and pelvis was performed following the standard protocol without IV contrast. COMPARISON:  None. FINDINGS: Lower chest: Small pleural effusions. Dependent lung opacity, left greater than right, consistent with atelectasis. Hepatobiliary: Liver small and nodular. No discrete mass. Gallbladder surgically absent. No bile duct dilation. Pancreas: Unremarkable. No pancreatic ductal dilatation or surrounding inflammatory changes. Spleen: Mild splenic enlargement. Spleen measures 14.5 x 5.5 x 12.4 cm. No splenic mass or focal lesion. Adrenals/Urinary Tract: No adrenal masses. Kidneys normal in size and position.  No renal masses, stones or hydronephrosis. No ureteral stones are dilation. Bladder is unremarkable. Stomach/Bowel: Stomach unremarkable. No evidence of bowel obstruction or bowel inflammation. Vascular/Lymphatic: Mild aortic atherosclerotic calcifications. No aneurysm. There are vascular collaterals in the upper abdomen. Shoddy soft tissue nodules along the lesser curvature of the stomach above the celiac axis consistent with mild adenopathy versus peritoneal metastatic disease. There is another area of irregular soft tissue attenuation in the left upper quadrant above the hepatic flexure of the colon. Prominent to mildly enlarged retroperitoneal lymph nodes, largest measuring 13 mm in short axis. Reproductive: Unremarkable. Other: Diffuse subcutaneous soft tissue edema.  No ascites. Musculoskeletal: Limited visualization. Mild compression fracture of L4 of unclear chronicity, likely old. IMPRESSION: 1. Exam is limited due to the patient's body habitus and lack of contrast. Allowing for this, no acute findings in the abdomen or pelvis. 2. Cirrhosis with evidence of portal venous hypertension reflected by mild splenomegaly and vascular collaterals. 3. Shoddy soft tissue nodules above the celiac axis and the left upper quadrant. This may be a combination of prominent lymph nodes and vascular collaterals. Peritoneal metastatic disease is possible. 4. Small bilateral pleural effusions with associated dependent atelectasis. 5. Diffuse subcutaneous edema. 6. Mild compression fracture of L4 not well visualized on this exam. This is most likely old. Electronically Signed   By: Amie Portland M.D.   On: 02/15/2018 20:43     Medications:       Assessment/ Plan:  57 y.o. 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   1.  Acute renal failure. 2.   Hyponatremia. 3.  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 4.  Anemia unspecified.   Plan: Renal function worse.  Creatinine up to 4.36.  Has underlying cirrhosis.  Could potentially represent hepatorenal syndrome.  Lasix has been stopped.  We will proceed with interventional radiology consultation to place temporary dialysis catheter and thereafter start dialysis today and also have a dialysis treatment scheduled for Wednesday.  Overall prognosis very guarded given underlying morbid obesity, cirrhosis, generalized debility.    LOS: 0 Sabrina Arriaga 7/15/20198:46 AM

## 2018-02-18 LAB — HEPATITIS B SURFACE ANTIGEN: HEP B S AG: NEGATIVE

## 2018-02-18 LAB — CBC
HEMATOCRIT: 21.5 % — AB (ref 39.0–52.0)
Hemoglobin: 7.2 g/dL — ABNORMAL LOW (ref 13.0–17.0)
MCH: 32.7 pg (ref 26.0–34.0)
MCHC: 33.5 g/dL (ref 30.0–36.0)
MCV: 97.7 fL (ref 78.0–100.0)
Platelets: 89 10*3/uL — ABNORMAL LOW (ref 150–400)
RBC: 2.2 MIL/uL — AB (ref 4.22–5.81)
RDW: 23 % — ABNORMAL HIGH (ref 11.5–15.5)
WBC: 7.9 10*3/uL (ref 4.0–10.5)

## 2018-02-18 LAB — HEMOGLOBIN AND HEMATOCRIT, BLOOD
HCT: 34.3 % — ABNORMAL LOW (ref 39.0–52.0)
Hemoglobin: 9.8 g/dL — ABNORMAL LOW (ref 13.0–17.0)

## 2018-02-18 LAB — HEPATITIS B SURFACE ANTIBODY,QUALITATIVE: Hep B S Ab: NONREACTIVE

## 2018-02-18 LAB — PROTIME-INR
INR: 3.36
PROTHROMBIN TIME: 33.7 s — AB (ref 11.4–15.2)

## 2018-02-18 LAB — HEPATITIS B CORE ANTIBODY, TOTAL: Hep B Core Total Ab: NEGATIVE

## 2018-02-19 LAB — CBC
HEMATOCRIT: 22.8 % — AB (ref 39.0–52.0)
Hemoglobin: 7.6 g/dL — ABNORMAL LOW (ref 13.0–17.0)
MCH: 33.3 pg (ref 26.0–34.0)
MCHC: 33.3 g/dL (ref 30.0–36.0)
MCV: 100 fL (ref 78.0–100.0)
PLATELETS: 90 10*3/uL — AB (ref 150–400)
RBC: 2.28 MIL/uL — ABNORMAL LOW (ref 4.22–5.81)
RDW: 23.9 % — ABNORMAL HIGH (ref 11.5–15.5)
WBC: 9.2 10*3/uL (ref 4.0–10.5)

## 2018-02-19 LAB — RENAL FUNCTION PANEL
Albumin: 2.4 g/dL — ABNORMAL LOW (ref 3.5–5.0)
Anion gap: 16 — ABNORMAL HIGH (ref 5–15)
BUN: 49 mg/dL — ABNORMAL HIGH (ref 6–20)
CHLORIDE: 101 mmol/L (ref 98–111)
CO2: 18 mmol/L — AB (ref 22–32)
CREATININE: 4.71 mg/dL — AB (ref 0.61–1.24)
Calcium: 8.7 mg/dL — ABNORMAL LOW (ref 8.9–10.3)
GFR calc Af Amer: 15 mL/min — ABNORMAL LOW (ref >60)
GFR, EST NON AFRICAN AMERICAN: 13 mL/min — AB (ref >60)
GLUCOSE: 104 mg/dL — AB (ref 70–99)
POTASSIUM: 4.2 mmol/L (ref 3.5–5.1)
Phosphorus: 7.5 mg/dL — ABNORMAL HIGH (ref 2.5–4.6)
Sodium: 135 mmol/L (ref 135–145)

## 2018-02-19 LAB — PROTIME-INR
INR: 3.5
PROTHROMBIN TIME: 34.8 s — AB (ref 11.4–15.2)

## 2018-02-19 LAB — AMMONIA: AMMONIA: 93 umol/L — AB (ref 9–35)

## 2018-02-19 NOTE — Progress Notes (Signed)
[facility]  ROUNDING NOTE   Subjective:  Patient status appears to be worsening. He appears confused at the moment. Temporary dialysis catheter has been placed. Hypotension also persists. Patient has been made DNR.   Objective:  Vital signs in last 24 hours:  Temperature 96.2 pulse with 16 respirations 22 blood pressure 101/52  Physical Exam: General: Critically ill-appearing  Head: Normocephalic, atraumatic. Moist oral mucosal membranes  Eyes: Icteric  Neck: Supple, trachea midline  Lungs:  Clear to auscultation, normal effort  Heart: S1S2 no rubs  Abdomen:  Soft, nontender, bowel sounds present  Extremities: 3+ peripheral edema.  Neurologic: Arousable but confused  Skin: Jaundice noted, cream on bilateral lower extremeties       Basic Metabolic Panel: Recent Labs  Lab 02/13/18 0630 02/14/18 0349 02/15/18 0629 02/16/18 0645 02/17/18 0710 02/17/18 1000 02/19/18 0606  NA 132* 131* 130* 132* 133* 132* 135  K 4.2 4.4 4.5 4.4 4.6 4.5 4.2  CL 95* 96* 98 99 99 99 101  CO2 26 24 21* 21* 20* 20* 18*  GLUCOSE 147* 128* 117* 137* 118* 122* 104*  BUN 35* 40* 44* 48* 51* 51* 49*  CREATININE 2.27* 2.88* 3.37* 3.85* 4.36* 4.49* 4.71*  CALCIUM 8.4* 8.2* 8.4* 8.6* 8.7* 8.6* 8.7*  MG 2.2 2.1 2.1 2.1 2.3  --   --   PHOS 6.0* 6.4* 7.0* 7.4* 7.6* 7.4* 7.5*    Liver Function Tests: Recent Labs  Lab 02/14/18 0349 02/15/18 0629 02/16/18 0645 02/17/18 0710 02/17/18 1000 02/19/18 0606  AST 129*  --   --   --   --   --   ALT 50*  --   --   --   --   --   ALKPHOS 188*  --   --   --   --   --   BILITOT 5.2*  --   --   --   --   --   PROT 5.7*  --   --   --   --   --   ALBUMIN 1.9* 2.0* 2.2* 2.5* 2.5* 2.4*   No results for input(s): LIPASE, AMYLASE in the last 168 hours. Recent Labs  Lab 02/16/18 0645 02/17/18 0710 02/19/18 0606  AMMONIA 73* 70* 93*    CBC: Recent Labs  Lab 02/15/18 0629 02/16/18 0645 02/17/18 0710 02/18/18 0718 02/18/18 1816  02/19/18 0606  WBC 7.9 7.1 8.0 7.9  --  9.2  HGB 8.0* 7.8* 7.6* 7.2* 9.8* 7.6*  HCT 24.5* 24.3* 23.0* 21.5* 34.3* 22.8*  MCV 99.2 99.2 97.9 97.7  --  100.0  PLT 116* 109* 110* 89*  --  90*    Cardiac Enzymes: No results for input(s): CKTOTAL, CKMB, CKMBINDEX, TROPONINI in the last 168 hours.  BNP: Invalid input(s): POCBNP  CBG: No results for input(s): GLUCAP in the last 168 hours.  Microbiology: Results for orders placed or performed during the hospital encounter of 12/26/17  Urine culture     Status: Abnormal   Collection Time: 12/29/17 11:55 AM  Result Value Ref Range Status   Specimen Description   Final    URINE, CLEAN CATCH Performed at [facility], [address] [place], [place] [address]    Special Requests   Final    NONE Performed at [facility], [address] [place], [place] [address]    Culture MULTIPLE SPECIES PRESENT, SUGGEST RECOLLECTION (A)  Final   Report Status 12/30/2017 FINAL  Final    Coagulation Studies:  Recent Labs    02/17/18 0710 02/18/18 0718 02/19/18 0606  LABPROT 31.0* 33.7* 34.8*  INR 3.01 3.36 3.50    Urinalysis: No results for input(s): COLORURINE, LABSPEC, PHURINE, GLUCOSEU, HGBUR, BILIRUBINUR, KETONESUR, PROTEINUR, UROBILINOGEN, NITRITE, LEUKOCYTESUR in the last 72 hours.  Invalid input(s): APPERANCEUR    Imaging: Ir Fluoro Guide Cv Line Left  Result Date: 02/17/2018 INDICATION: 57 year old with acute renal failure. Patient needs a temporary dialysis catheter. EXAM: FLUOROSCOPIC AND ULTRASOUND GUIDED PLACEMENT OF A NON-TUNNELED DIALYSIS CATHETER Physician: Rachelle Hora. Henn, MD MEDICATIONS: None ANESTHESIA/SEDATION: None FLUOROSCOPY TIME:  Fluoroscopy Time: 1 minutes 12 seconds (10 mGy). COMPLICATIONS: None immediate. PROCEDURE: Informed consent was obtained for catheter placement. The patient was placed supine on the interventional table. Ultrasound confirmed a patent left internal jugular vein. Ultrasound  images were obtained for documentation. Left side of the neck was selected for access because the patient has a right jugular Port-A-Cath. The left side of the neck was prepped and draped in a sterile fashion. The left side of the neck was anesthetized with 1% lidocaine. Maximal barrier sterile technique was utilized including caps, mask, sterile gowns, sterile gloves, sterile drape, hand hygiene and skin antiseptic. A small incision was made with #11 blade scalpel. A 21 gauge needle directed into the left internal jugular vein with ultrasound guidance. A micropuncture dilator set was placed. A 20 cm Mahurkar catheter was selected. The catheter was advanced over a wire and positioned at the superior cavoatrial junction. Fluoroscopic images were obtained for documentation. Both dialysis lumens were found to aspirate and flush well. The proper amount of heparin was flushed in both lumens. The central venous lumen was flushed with normal saline. Catheter was sutured to skin. FINDINGS: Catheter tip at the superior cavoatrial junction. IMPRESSION: Successful placement of a left jugular non-tunneled dialysis catheter using ultrasound and fluoroscopic guidance. Electronically Signed   By: Richarda Overlie M.D.   On: 02/17/2018 15:09   Ir US Guide Vasc Access Left  Result Date: 02/17/2018 INDICATION: 57 year old with acute renal failure. Patient needs a temporary dialysis catheter. EXAM: FLUOROSCOPIC AND ULTRASOUND GUIDED PLACEMENT OF A NON-TUNNELED DIALYSIS CATHETER Physician: Rachelle Hora. Henn, MD MEDICATIONS: None ANESTHESIA/SEDATION: None FLUOROSCOPY TIME:  Fluoroscopy Time: 1 minutes 12 seconds (10 mGy). COMPLICATIONS: None immediate. PROCEDURE: Informed consent was obtained for catheter placement. The patient was placed supine on the interventional table. Ultrasound confirmed a patent left internal jugular vein. Ultrasound images were obtained for documentation. Left side of the neck was selected for access because the  patient has a right jugular Port-A-Cath. The left side of the neck was prepped and draped in a sterile fashion. The left side of the neck was anesthetized with 1% lidocaine. Maximal barrier sterile technique was utilized including caps, mask, sterile gowns, sterile gloves, sterile drape, hand hygiene and skin antiseptic. A small incision was made with #11 blade scalpel. A 21 gauge needle directed into the left internal jugular vein with ultrasound guidance. A micropuncture dilator set was placed. A 20 cm Mahurkar catheter was selected. The catheter was advanced over a wire and positioned at the superior cavoatrial junction. Fluoroscopic images were obtained for documentation. Both dialysis lumens were found to aspirate and flush well. The proper amount of heparin was flushed in both lumens. The central venous lumen was flushed with normal saline. Catheter was sutured to skin. FINDINGS: Catheter tip at the superior cavoatrial junction. IMPRESSION: Successful placement of a left jugular non-tunneled dialysis catheter using ultrasound and fluoroscopic guidance. Electronically Signed   By:  Richarda OverlieAdam  Henn M.D.   On: 02/17/2018 15:09     Medications:       Assessment/ Plan:  57 y.o. 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   1.  Acute renal failure. 2.  Hyponatremia. 3.  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 4.  Anemia unspecified.   Plan: Temporary dialysis catheter has been placed.  He will be due for dialysis again today.  Overall status appears to be worsening. We will plan further dialysis treatment on Friday tentatively however patient very high risk for further deterioration.  He has been made DNR status.  Continue to use albumin for blood pressure support. Further plan as patient progresses.    LOS: 0 Proctor Carriker 7/17/201911:13 AM

## 2018-02-20 ENCOUNTER — Other Ambulatory Visit (HOSPITAL_COMMUNITY): Payer: Self-pay

## 2018-02-20 LAB — COMPREHENSIVE METABOLIC PANEL
ALBUMIN: 2.5 g/dL — AB (ref 3.5–5.0)
ALK PHOS: 123 U/L (ref 38–126)
ALT: 31 U/L (ref 0–44)
AST: 70 U/L — AB (ref 15–41)
Anion gap: 17 — ABNORMAL HIGH (ref 5–15)
BILIRUBIN TOTAL: 11.9 mg/dL — AB (ref 0.3–1.2)
BUN: 40 mg/dL — AB (ref 6–20)
CALCIUM: 8.6 mg/dL — AB (ref 8.9–10.3)
CO2: 17 mmol/L — ABNORMAL LOW (ref 22–32)
CREATININE: 4.36 mg/dL — AB (ref 0.61–1.24)
Chloride: 101 mmol/L (ref 98–111)
GFR calc Af Amer: 16 mL/min — ABNORMAL LOW (ref >60)
GFR calc non Af Amer: 14 mL/min — ABNORMAL LOW (ref >60)
GLUCOSE: 87 mg/dL (ref 70–99)
POTASSIUM: 4.2 mmol/L (ref 3.5–5.1)
Sodium: 135 mmol/L (ref 135–145)
TOTAL PROTEIN: 6.3 g/dL — AB (ref 6.5–8.1)

## 2018-02-20 LAB — AMMONIA: AMMONIA: 84 umol/L — AB (ref 9–35)

## 2018-02-20 LAB — CBC
HEMATOCRIT: 21.1 % — AB (ref 39.0–52.0)
HEMOGLOBIN: 7 g/dL — AB (ref 13.0–17.0)
MCH: 32.7 pg (ref 26.0–34.0)
MCHC: 33.2 g/dL (ref 30.0–36.0)
MCV: 98.6 fL (ref 78.0–100.0)
Platelets: 76 10*3/uL — ABNORMAL LOW (ref 150–400)
RBC: 2.14 MIL/uL — ABNORMAL LOW (ref 4.22–5.81)
RDW: 24 % — AB (ref 11.5–15.5)
WBC: 10.6 10*3/uL — AB (ref 4.0–10.5)

## 2018-02-20 LAB — PROTIME-INR
INR: 3.54
Prothrombin Time: 35.2 seconds — ABNORMAL HIGH (ref 11.4–15.2)

## 2018-02-21 LAB — CBC
HCT: 20 % — ABNORMAL LOW (ref 39.0–52.0)
HEMOGLOBIN: 6.4 g/dL — AB (ref 13.0–17.0)
MCH: 32.3 pg (ref 26.0–34.0)
MCHC: 32 g/dL (ref 30.0–36.0)
MCV: 101 fL — ABNORMAL HIGH (ref 78.0–100.0)
PLATELETS: 65 10*3/uL — AB (ref 150–400)
RBC: 1.98 MIL/uL — AB (ref 4.22–5.81)
RDW: 23.9 % — ABNORMAL HIGH (ref 11.5–15.5)
WBC: 8 10*3/uL (ref 4.0–10.5)

## 2018-02-21 LAB — PROTIME-INR
INR: 4
PROTHROMBIN TIME: 38.7 s — AB (ref 11.4–15.2)

## 2018-02-21 LAB — RENAL FUNCTION PANEL
ALBUMIN: 2.3 g/dL — AB (ref 3.5–5.0)
ANION GAP: 13 (ref 5–15)
BUN: 44 mg/dL — ABNORMAL HIGH (ref 6–20)
CO2: 21 mmol/L — ABNORMAL LOW (ref 22–32)
CREATININE: 4.81 mg/dL — AB (ref 0.61–1.24)
Calcium: 8.9 mg/dL (ref 8.9–10.3)
Chloride: 102 mmol/L (ref 98–111)
GFR, EST AFRICAN AMERICAN: 14 mL/min — AB (ref >60)
GFR, EST NON AFRICAN AMERICAN: 12 mL/min — AB (ref >60)
Glucose, Bld: 98 mg/dL (ref 70–99)
PHOSPHORUS: 7.3 mg/dL — AB (ref 2.5–4.6)
Potassium: 4.2 mmol/L (ref 3.5–5.1)
SODIUM: 136 mmol/L (ref 135–145)

## 2018-02-21 LAB — BPAM FFP
Blood Product Expiration Date: 201907222359
Blood Product Expiration Date: 201907222359
ISSUE DATE / TIME: 201907190754
ISSUE DATE / TIME: 201907171834
UNIT TYPE AND RH: 5100
Unit Type and Rh: 5100

## 2018-02-21 LAB — PREPARE FRESH FROZEN PLASMA

## 2018-02-21 NOTE — Progress Notes (Signed)
[facility]  ROUNDING NOTE   Subjective:  Patient with further deterioration unfortunately. He appears obtunded. Significant jaundice noted. Family has opted for comfort care.   Objective:  Vital signs in last 24 hours:  Temperature 96 pulse 120 respirations 12 blood pressure 88/48  Physical Exam: General: Critically ill-appearing  Head: Normocephalic, atraumatic. Moist oral mucosal membranes  Eyes: Icteric  Neck: Supple, trachea midline  Lungs:  Clear to auscultation, normal effort  Heart: S1S2 no rubs  Abdomen:  Soft, nontender, bowel sounds present  Extremities: 3+ peripheral edema.  Neurologic:  Obtunded  Skin: Jaundice noted       Basic Metabolic Panel: Recent Labs  Lab 02/15/18 0629 02/16/18 0645 02/17/18 0710 02/17/18 1000 02/19/18 0606 02/20/18 0934 02/21/18 0630  NA 130* 132* 133* 132* 135 135 136  K 4.5 4.4 4.6 4.5 4.2 4.2 4.2  CL 98 99 99 99 101 101 102  CO2 21* 21* 20* 20* 18* 17* 21*  GLUCOSE 117* 137* 118* 122* 104* 87 98  BUN 44* 48* 51* 51* 49* 40* 44*  CREATININE 3.37* 3.85* 4.36* 4.49* 4.71* 4.36* 4.81*  CALCIUM 8.4* 8.6* 8.7* 8.6* 8.7* 8.6* 8.9  MG 2.1 2.1 2.3  --   --   --   --   PHOS 7.0* 7.4* 7.6* 7.4* 7.5*  --  7.3*    Liver Function Tests: Recent Labs  Lab 02/17/18 0710 02/17/18 1000 02/19/18 0606 02/20/18 0934 02/21/18 0630  AST  --   --   --  70*  --   ALT  --   --   --  31  --   ALKPHOS  --   --   --  123  --   BILITOT  --   --   --  11.9*  --   PROT  --   --   --  6.3*  --   ALBUMIN 2.5* 2.5* 2.4* 2.5* 2.3*   No results for input(s): LIPASE, AMYLASE in the last 168 hours. Recent Labs  Lab 02/17/18 0710 02/19/18 0606 02/20/18 0934  AMMONIA 70* 93* 84*    CBC: Recent Labs  Lab 02/17/18 0710 02/18/18 0718 02/18/18 1816 02/19/18 0606 02/20/18 0934 02/21/18 0630  WBC 8.0 7.9  --  9.2 10.6* 8.0  HGB 7.6* 7.2* 9.8* 7.6* 7.0* 6.4*  HCT 23.0* 21.5* 34.3* 22.8* 21.1* 20.0*  MCV 97.9 97.7  --  100.0  98.6 101.0*  PLT 110* 89*  --  90* 76* 65*    Cardiac Enzymes: No results for input(s): CKTOTAL, CKMB, CKMBINDEX, TROPONINI in the last 168 hours.  BNP: Invalid input(s): POCBNP  CBG: No results for input(s): GLUCAP in the last 168 hours.  Microbiology: Results for orders placed or performed during the hospital encounter of 12/26/17  Urine culture     Status: Abnormal   Collection Time: 12/29/17 11:55 AM  Result Value Ref Range Status   Specimen Description   Final    URINE, CLEAN CATCH Performed at [facility], [address], [place], [place] [address]    Special Requests   Final    NONE Performed at [facility], [address] [place], [place] [address]    Culture MULTIPLE SPECIES PRESENT, SUGGEST RECOLLECTION (A)  Final   Report Status 12/30/2017 FINAL  Final    Coagulation Studies: Recent Labs    02/19/18 0606 02/20/18 0505 02/21/18 0630  LABPROT 34.8* 35.2* 38.7*  INR 3.50 3.54 4.00    Urinalysis: No results for  input(s): COLORURINE, LABSPEC, PHURINE, GLUCOSEU, HGBUR, BILIRUBINUR, KETONESUR, PROTEINUR, UROBILINOGEN, NITRITE, LEUKOCYTESUR in the last 72 hours.  Invalid input(s): APPERANCEUR    Imaging: Dg Abd Portable 1v  Result Date: 02/20/2018 CLINICAL DATA:  NG tube placement EXAM: PORTABLE ABDOMEN - 1 VIEW COMPARISON:  Portable chest x-ray of 02/14/2018 FINDINGS: The tip of the endotracheal tube on the frontal view is located slightly to the right of midline at approximately T6 level. From this view however it cannot be determined if the NG tube is actually within the airway in which case it would be overlying the proximal right mainstem bronchus. Recommend retracting and re-attempting insertion. Airspace disease in noted throughout the lungs with probable effusions and cardiomegaly most consistent with CHF. Right Port-A-Cath tip overlies the mid SVC and left central venous line tip overlies the lower SVC. IMPRESSION: 1. Cannot  determine exact position of NG tube on current image on which overlies the proximal mainstem bronchus. Recommend removing and reattaching insertion. 2. Cardiomegaly and probable CHF. Electronically Signed   By: Dwyane DeePaul  Barry M.D.   On: 02/20/2018 12:24     Medications:       Assessment/ Plan:  57 y.o. 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   1.  Acute renal failure. 2.  Hyponatremia. 3.  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 4.  Anemia unspecified.   Plan: Patient evaluated at bedside.  He has had significant deterioration in his condition.  He has severe liver failure as well as renal failure.  Comfort care has been opted for.  This is certainly appropriate given his underlying comorbidities and severity of illness.  Comfort care measures as per hospitalist.    LOS: 0 Ronnel Zuercher 7/19/20198:40 AM

## 2018-02-23 LAB — PROTIME-INR
INR: 6.55 — AB
Prothrombin Time: 56.9 seconds — ABNORMAL HIGH (ref 11.4–15.2)

## 2018-03-06 DEATH — deceased

## 2019-08-14 IMAGING — DX DG TIBIA/FIBULA 2V*L*
4 series · 4 of 4 positions shown · non-contrast
Comparison: None.

CLINICAL DATA: Un witnessed fall.

EXAM:
LEFT TIBIA AND FIBULA - 2 VIEW

[tibia ap (1 of 2)]
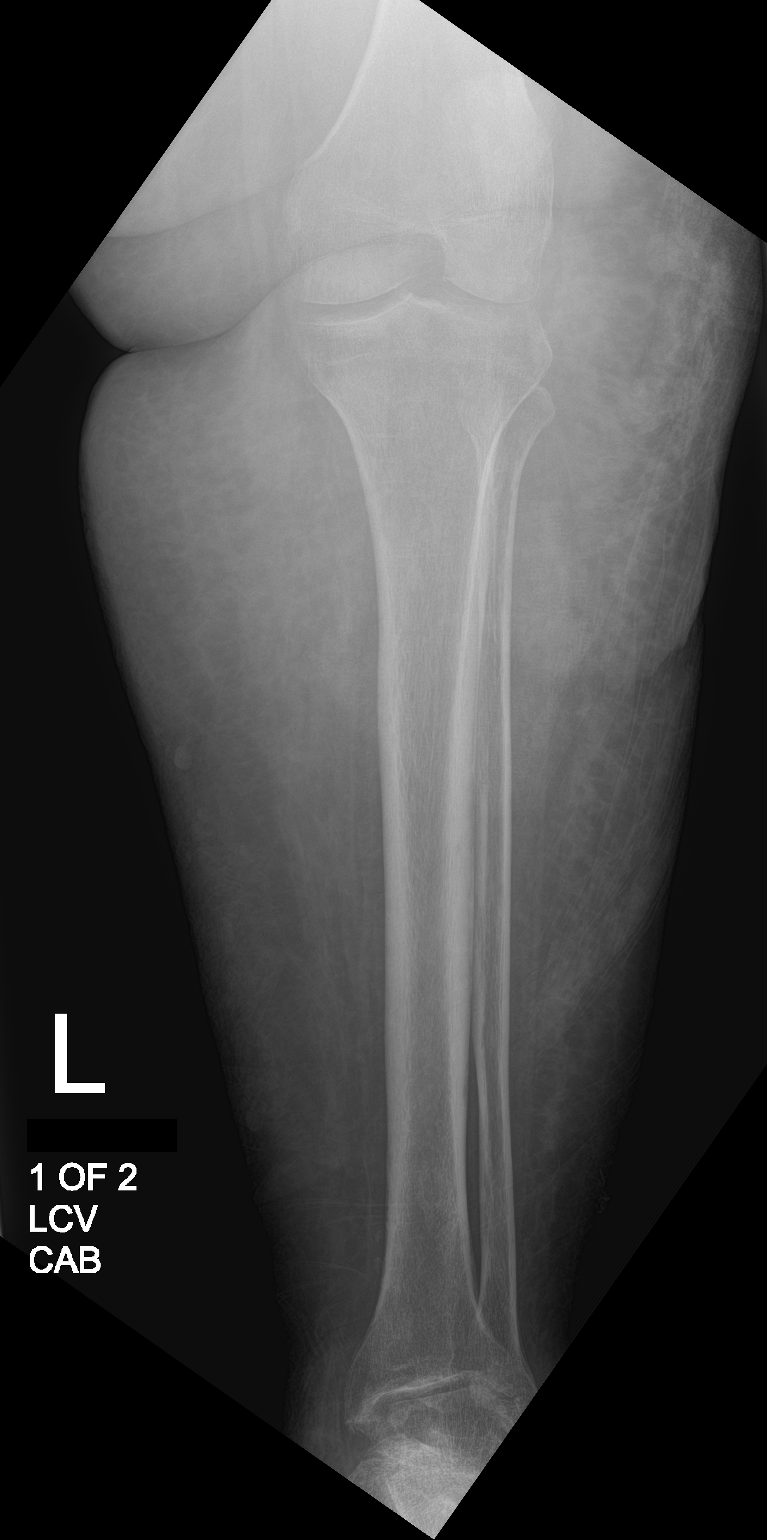

[tibia ap (2 of 2)]
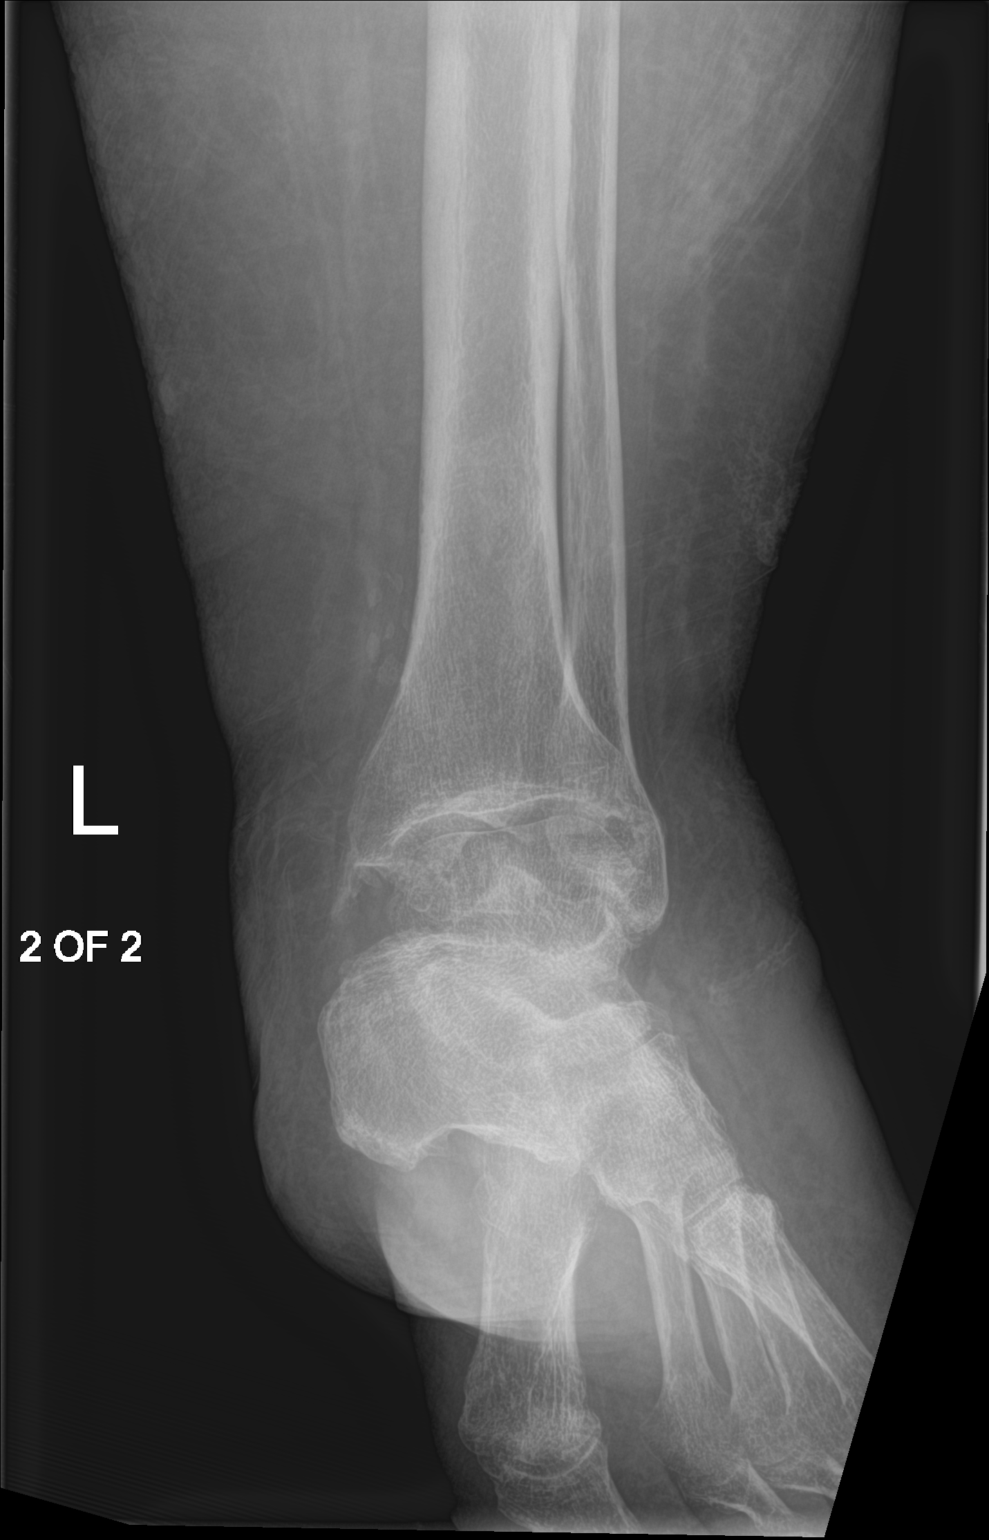

[tibia lat (1 of 2)]
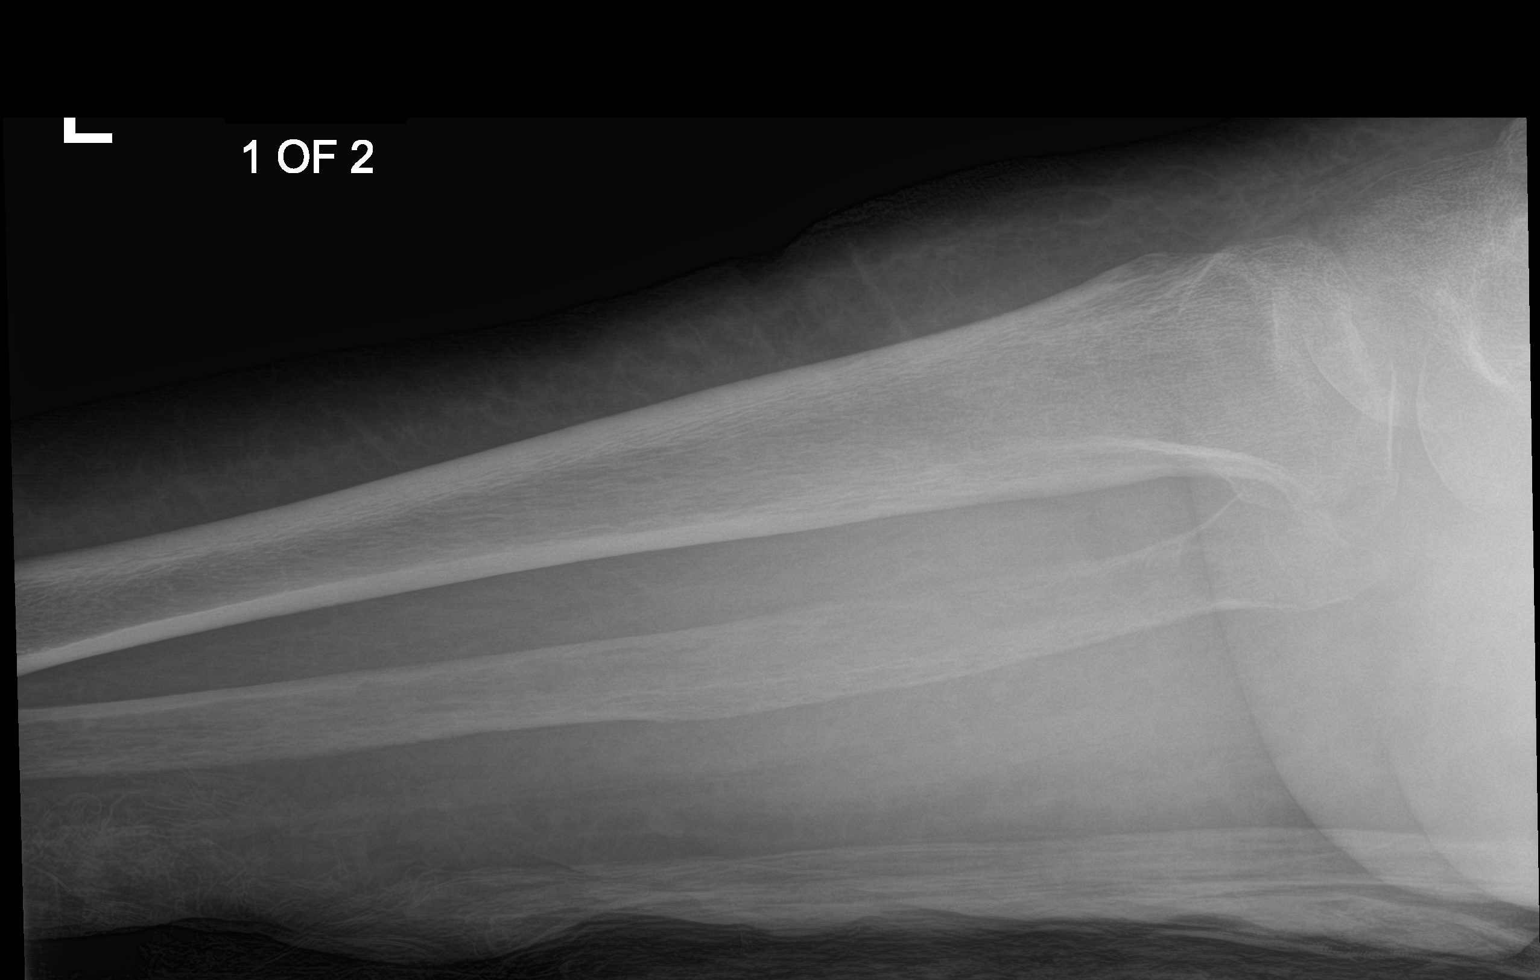

[tibia lat (2 of 2)]
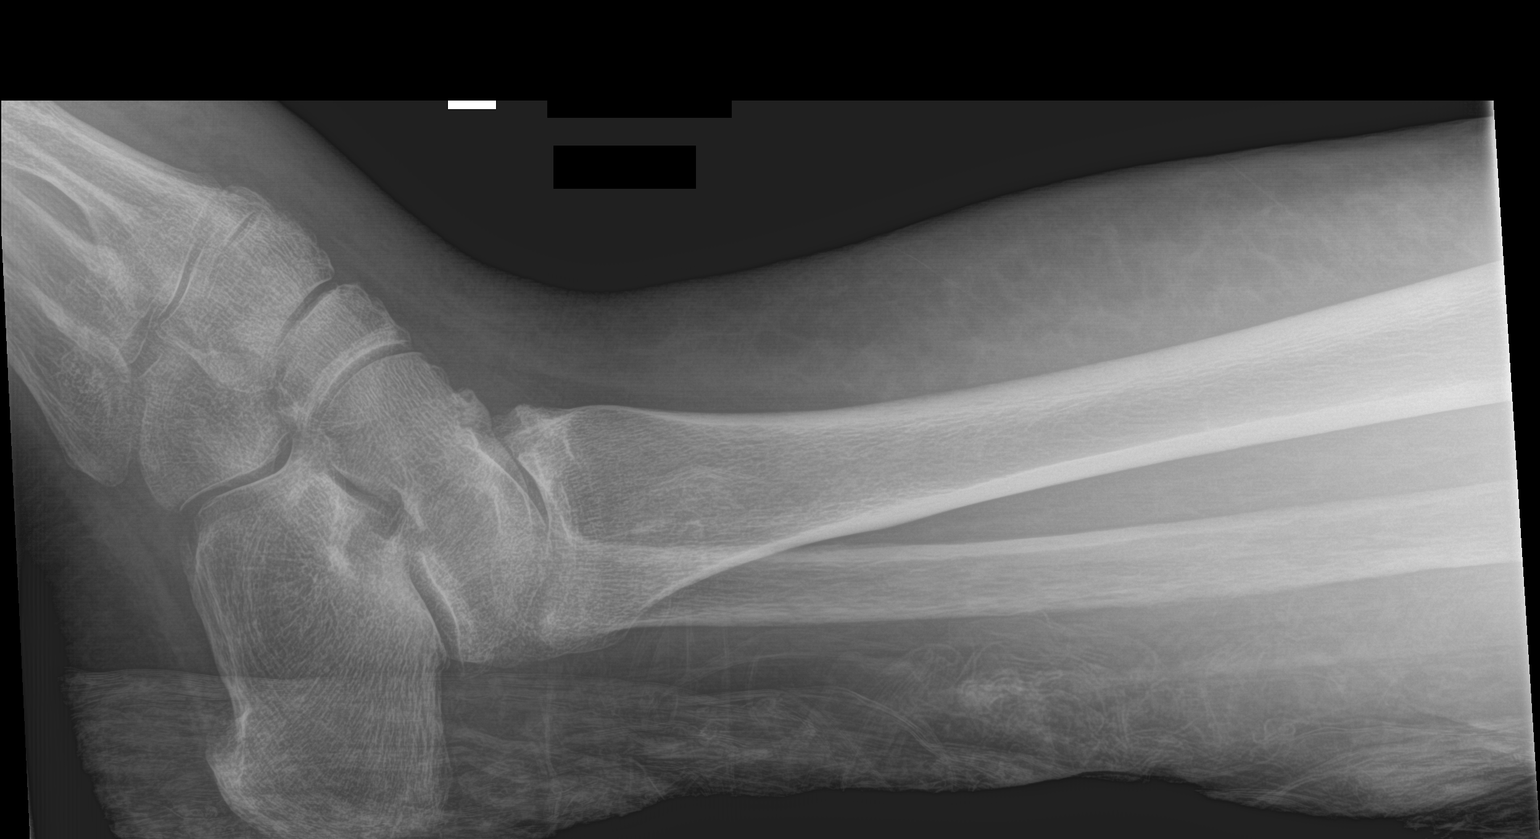

[4 of 4 positions shown; findings below may reference images not displayed]

FINDINGS: There is no evidence of fracture or dislocation. Soft tissues are
unremarkable.
IMPRESSION: No acute abnormality.

## 2019-08-14 IMAGING — CT CT HEAD W/O CM
4 of 6 series · 15 of 47 positions shown, 17 images · non-contrast
Comparison: None.

CLINICAL DATA: Post fall.

EXAM:
CT HEAD WITHOUT CONTRAST
TECHNIQUE: Contiguous axial images were obtained from the base of the skull
through the vertex without intravenous contrast.

[Series 2: head wo · axial · 0.47mm/px · z∈[-130,-30]mm · 5 of 32 slices shown, 7 images (1 of 2)]
[im 6/32  brain]
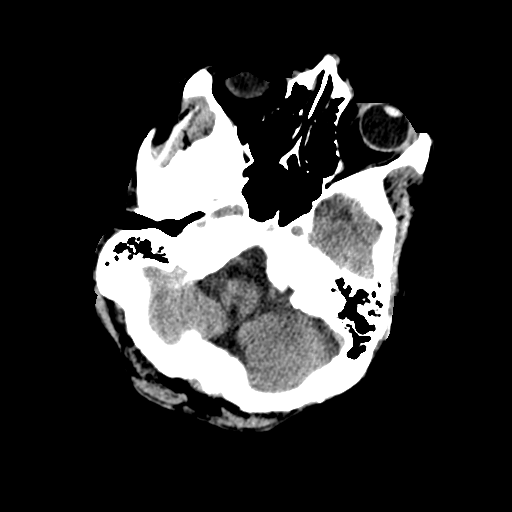
[im 6/32  bone]
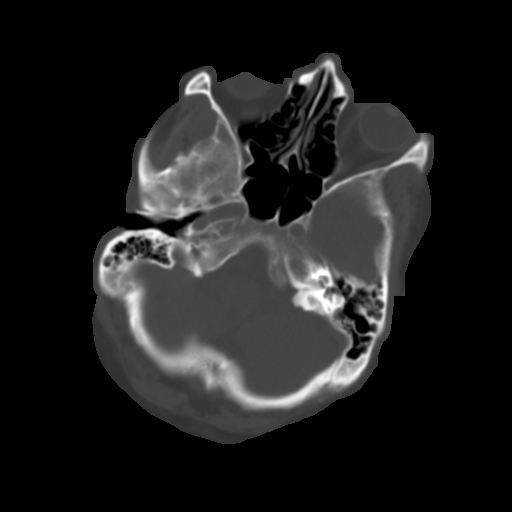
[im 11/32  brain]
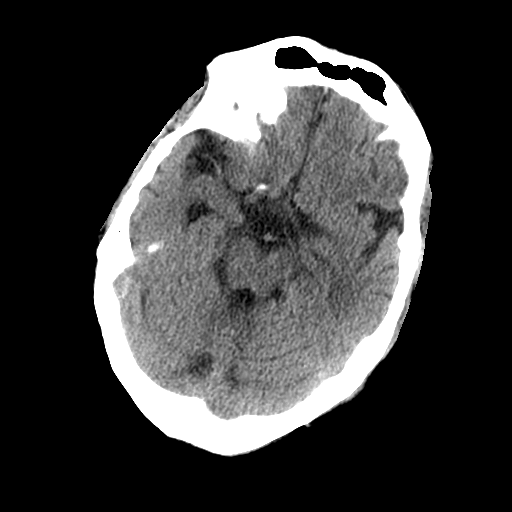
[im 16/32  brain]
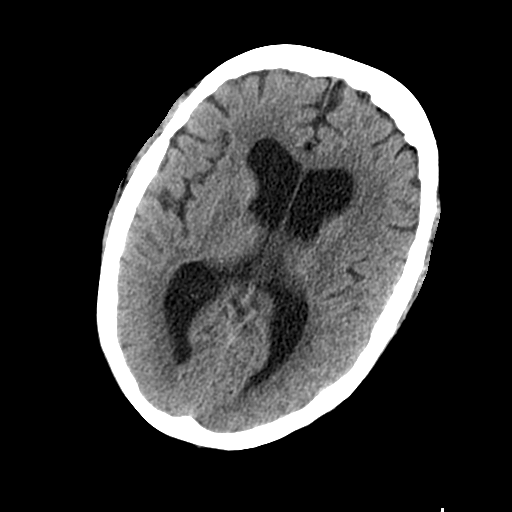
[im 21/32  brain]
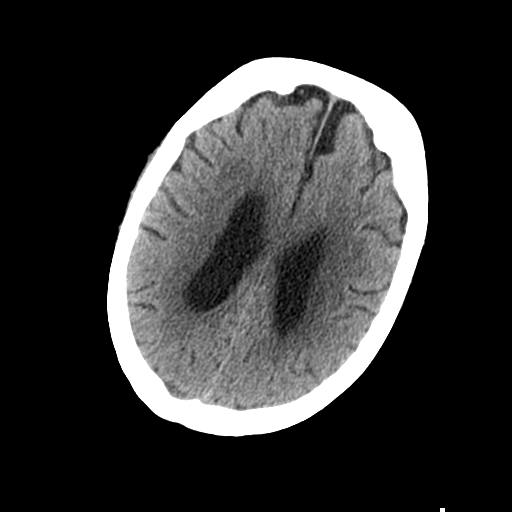
[im 26/32  brain]
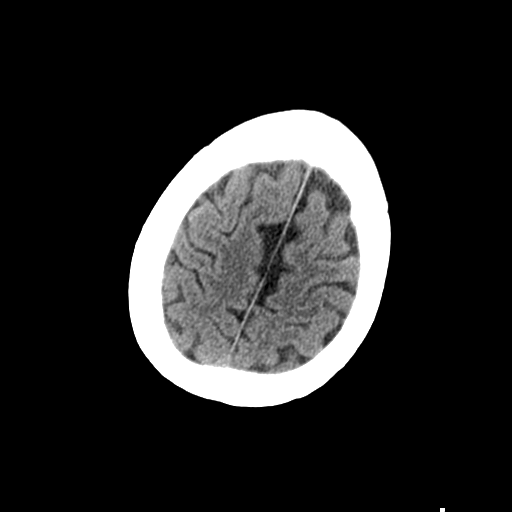
[im 26/32  bone]
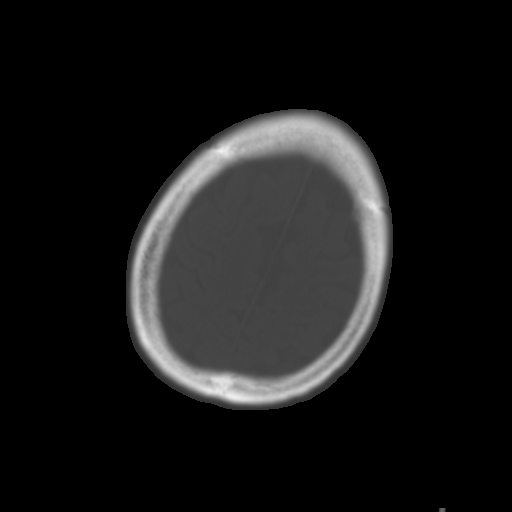

[Series 6: head wo · axial · 0.35mm/px · z∈[-89,-2]mm · 4 of 32 slices shown (2 of 2)]
[im 7/32  brain]
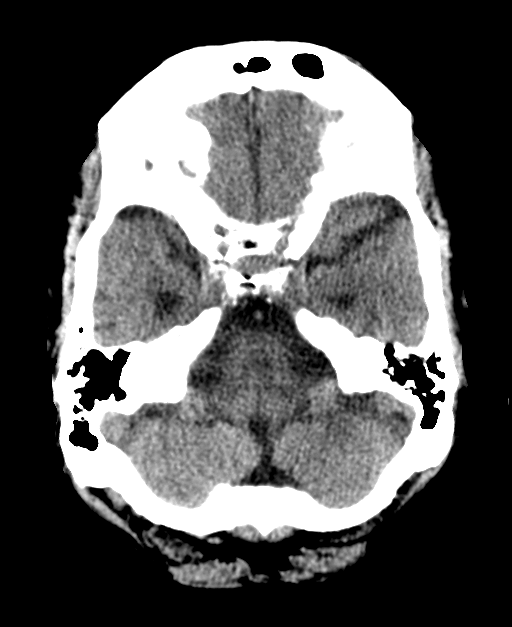
[im 13/32  brain]
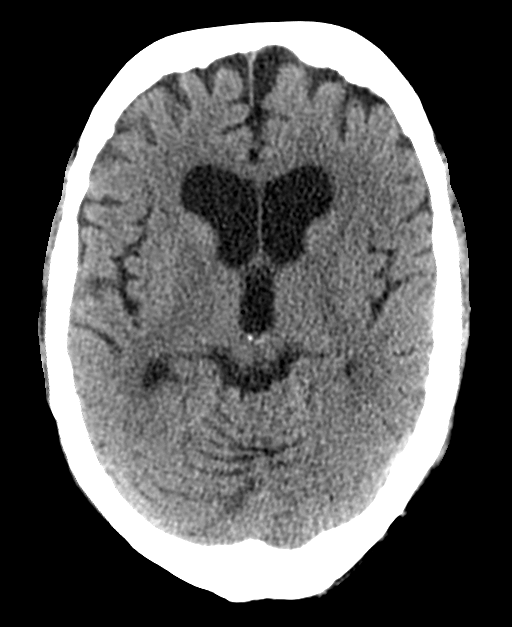
[im 19/32  brain]
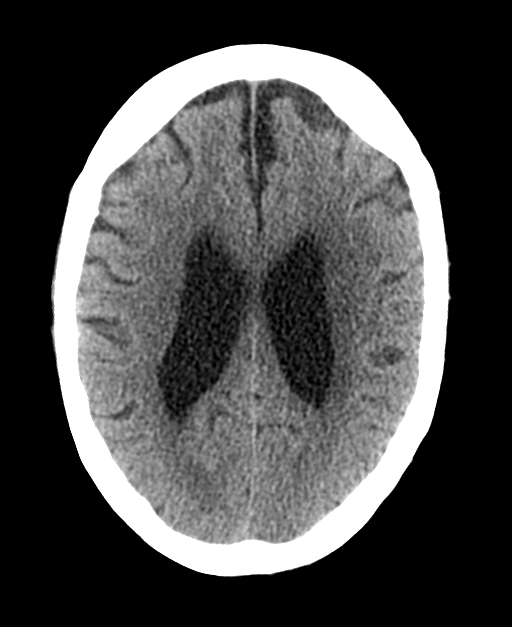
[im 25/32  brain]
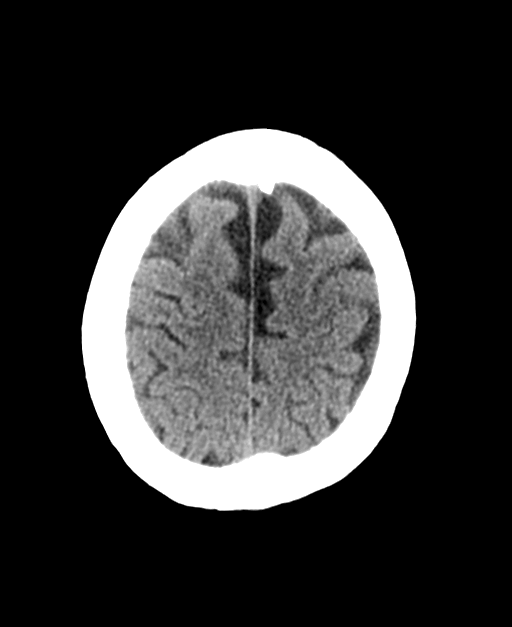

[Series 8: coronal soft tissue · coronal · 0.33mm/px · 3 of 73 slices shown]
[im 25/73  brain]
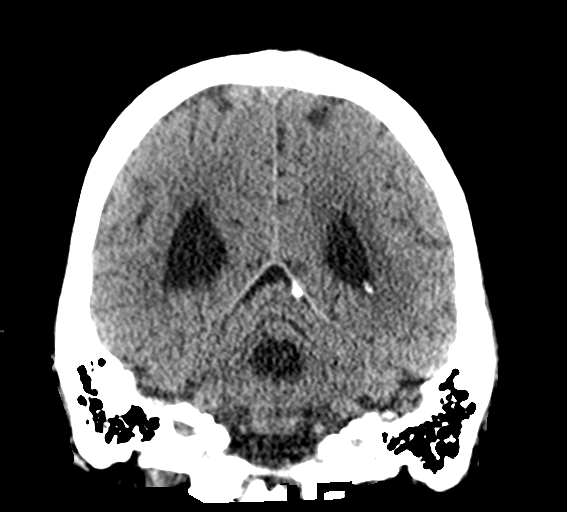
[im 33/73  brain]
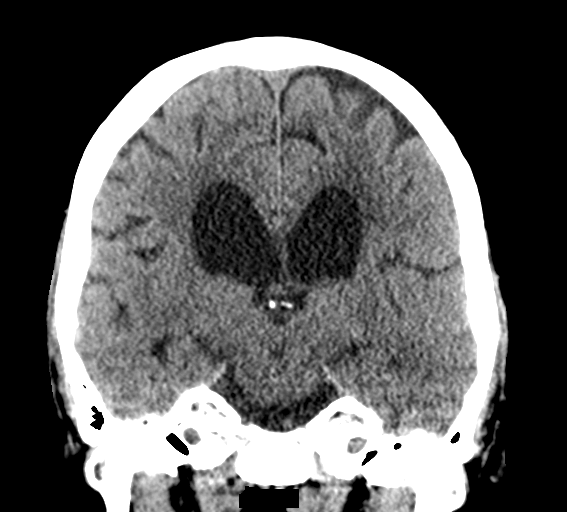
[im 41/73  brain]
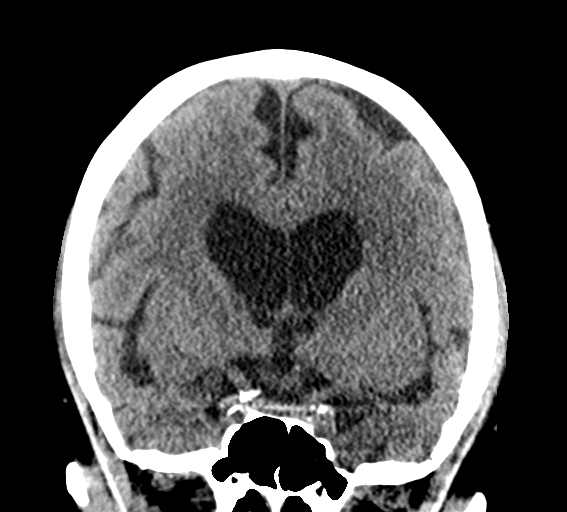

[Series 9: sagittal soft tissue · sagittal · 0.31mm/px · 3 of 60 slices shown]
[im 20/60  brain]
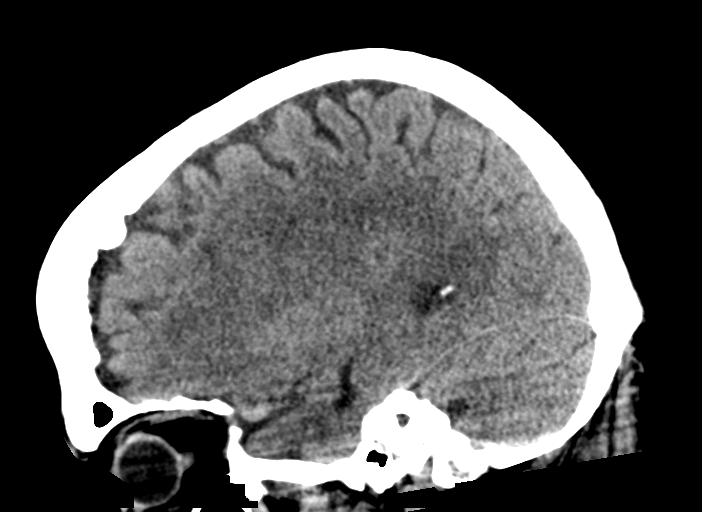
[im 30/60  brain]
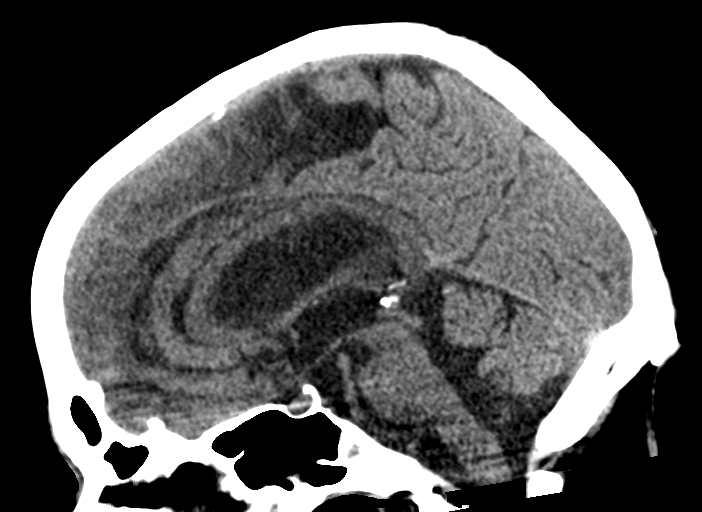
[im 40/60  brain]
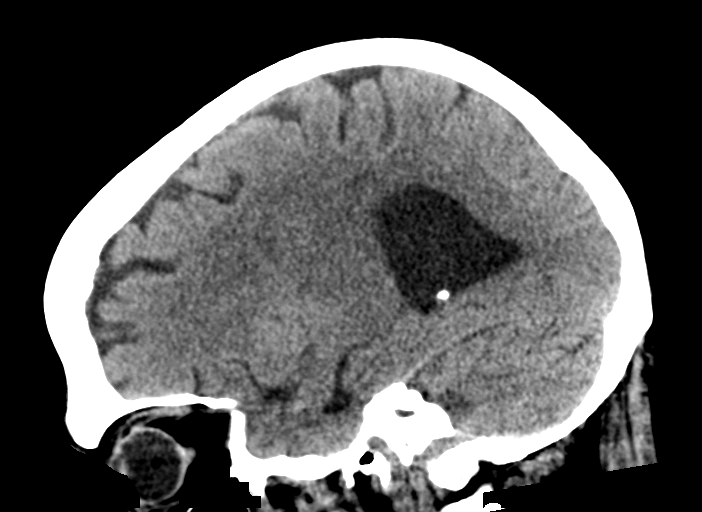

[15 of 47 positions shown; findings below may reference images not displayed]

FINDINGS: Brain: Mild atrophy with centralized volume loss and commensurate ex
vacuo dilatation of the ventricular system. Gray-white
differentiation is maintained. No CT evidence of acute large
territory infarct. No intraparenchymal or extra-axial mass or
hemorrhage. Normal configuration of the ventricles and the basilar
cisterns. No midline shift.

Vascular: Minimal intracranial atherosclerosis.

Skull: No definite displaced calvarial fracture.

Sinuses/Orbits: Limited visualization of the paranasal sinuses and
mastoid air cells is normal. No air-fluid levels. A small amount of
debris is seen within bilateral external auditory canals.

Other: Regional soft tissues appear normal. No radiopaque foreign
body.
IMPRESSION: Atrophy without superimposed acute intracranial process.
